# Patient Record
Sex: Male | Born: 2000 | Race: Black or African American | Hispanic: No | Marital: Single | Smoking: Former smoker
Health system: Southern US, Community
[De-identification: ages and names within clinical notes are randomized; demographics above are authoritative.]

## PROBLEM LIST (undated history)

## (undated) DIAGNOSIS — R51 Headache: Secondary | ICD-10-CM

## (undated) DIAGNOSIS — J45909 Unspecified asthma, uncomplicated: Secondary | ICD-10-CM

## (undated) HISTORY — DX: Headache: R51

## (undated) HISTORY — PX: CIRCUMCISION: SHX1350

---
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
## 2019-11-04 ENCOUNTER — Ambulatory Visit (HOSPITAL_COMMUNITY)
Admission: RE | Admit: 2019-11-04 | Discharge: 2019-11-04 | Disposition: A | Discharge status: Home / Self Care | Payer: Medicaid Other | Source: Ambulatory Visit | Attending: Family Medicine | Admitting: Family Medicine

## 2019-11-04 ENCOUNTER — Other Ambulatory Visit: Payer: Self-pay

## 2019-11-04 VITALS — BP 128/74 | HR 98 | Temp 99.8°F | Resp 18

## 2019-11-04 DIAGNOSIS — R1084 Generalized abdominal pain: Secondary | ICD-10-CM | POA: Insufficient documentation

## 2019-11-04 DIAGNOSIS — Z79899 Other long term (current) drug therapy: Secondary | ICD-10-CM | POA: Insufficient documentation

## 2019-11-04 DIAGNOSIS — J45909 Unspecified asthma, uncomplicated: Secondary | ICD-10-CM | POA: Insufficient documentation

## 2019-11-04 DIAGNOSIS — R000 Tachycardia, unspecified: Secondary | ICD-10-CM | POA: Insufficient documentation

## 2019-11-04 DIAGNOSIS — B349 Viral infection, unspecified: Secondary | ICD-10-CM | POA: Insufficient documentation

## 2019-11-04 DIAGNOSIS — Z1152 Encounter for screening for COVID-19: Secondary | ICD-10-CM | POA: Insufficient documentation

## 2019-11-04 DIAGNOSIS — R5383 Other fatigue: Secondary | ICD-10-CM | POA: Insufficient documentation

## 2019-11-04 DIAGNOSIS — Z20822 Contact with and (suspected) exposure to covid-19: Secondary | ICD-10-CM | POA: Insufficient documentation

## 2019-11-04 DIAGNOSIS — R509 Fever, unspecified: Secondary | ICD-10-CM | POA: Insufficient documentation

## 2019-11-04 DIAGNOSIS — R519 Headache, unspecified: Secondary | ICD-10-CM | POA: Insufficient documentation

## 2019-11-04 LAB — CBC WITH DIFFERENTIAL/PLATELET
Abs Immature Granulocytes: 0 10*3/uL (ref 0.00–0.07)
Basophils Absolute: 0 10*3/uL (ref 0.0–0.1)
Basophils Relative: 0 %
Eosinophils Absolute: 0.1 10*3/uL (ref 0.0–0.5)
Eosinophils Relative: 1 %
HCT: 33.8 % — ABNORMAL LOW (ref 39.0–52.0)
Hemoglobin: 10.4 g/dL — ABNORMAL LOW (ref 13.0–17.0)
Lymphocytes Relative: 4 %
Lymphs Abs: 0.3 10*3/uL — ABNORMAL LOW (ref 0.7–4.0)
MCH: 19.6 pg — ABNORMAL LOW (ref 26.0–34.0)
MCHC: 30.8 g/dL (ref 30.0–36.0)
MCV: 63.8 fL — ABNORMAL LOW (ref 80.0–100.0)
Monocytes Absolute: 0.4 10*3/uL (ref 0.1–1.0)
Monocytes Relative: 7 %
Neutro Abs: 5.6 10*3/uL (ref 1.7–7.7)
Neutrophils Relative %: 88 %
Platelets: 468 10*3/uL — ABNORMAL HIGH (ref 150–400)
RBC: 5.3 MIL/uL (ref 4.22–5.81)
RDW: 17.2 % — ABNORMAL HIGH (ref 11.5–15.5)
WBC: 6.4 10*3/uL (ref 4.0–10.5)
nRBC: 0 % (ref 0.0–0.2)
nRBC: 0 /100 WBC

## 2019-11-04 LAB — COMPREHENSIVE METABOLIC PANEL
ALT: 14 U/L (ref 0–44)
AST: 22 U/L (ref 15–41)
Albumin: 2.3 g/dL — ABNORMAL LOW (ref 3.5–5.0)
Alkaline Phosphatase: 71 U/L (ref 38–126)
Anion gap: 11 (ref 5–15)
BUN: 8 mg/dL (ref 6–20)
CO2: 25 mmol/L (ref 22–32)
Calcium: 8.2 mg/dL — ABNORMAL LOW (ref 8.9–10.3)
Chloride: 94 mmol/L — ABNORMAL LOW (ref 98–111)
Creatinine, Ser: 1.42 mg/dL — ABNORMAL HIGH (ref 0.61–1.24)
GFR calc Af Amer: >60 mL/min (ref >60)
GFR calc non Af Amer: >60 mL/min (ref >60)
Glucose, Bld: 116 mg/dL — ABNORMAL HIGH (ref 70–99)
Potassium: 3.4 mmol/L — ABNORMAL LOW (ref 3.5–5.1)
Sodium: 130 mmol/L — ABNORMAL LOW (ref 135–145)
Total Bilirubin: 0.5 mg/dL (ref 0.3–1.2)
Total Protein: 6.9 g/dL (ref 6.5–8.1)

## 2019-11-04 MED ORDER — SODIUM CHLORIDE 0.9 % IV BOLUS
1000.0000 mL | Freq: Once | INTRAVENOUS | Status: AC
  Administered 2019-11-04: 1000 mL via INTRAVENOUS

## 2019-11-04 MED ORDER — ONDANSETRON HCL 4 MG PO TABS
4.0000 mg | ORAL_TABLET | Freq: Four times a day (QID) | ORAL | 0 refills | Status: DC
Start: 2019-11-04 — End: 2020-05-29

## 2019-11-04 NOTE — ED Triage Notes (Signed)
Pt c/o RLQ abd pain with chills on Monday. States abdominal pain resolved on Tuesday, but is now having hot flashes, headaches, fatigue and diarrhea. Pt had ruptured appendix 2 years ago.

## 2019-11-04 NOTE — Discharge Instructions (Addendum)
Your blood work looks ok, but I would like to check your kidney function again tomorrow  Your EKG was normal  Your COVID test is pending.  You should self quarantine until the test result is back.    Take Tylenol as needed for fever or discomfort.  Rest and keep yourself hydrated.    Go to the emergency department if you develop acute worsening symptoms.

## 2019-11-04 NOTE — ED Provider Notes (Signed)
[facility]   [id number] 11/04/19 Arrival Time: 1556   CC: COVID symptoms  SUBJECTIVE: History from: patient.  Glenn [name] is a 19 y.o. male who presents with abrupt onset of fatigue, abdominal pain, nasal congestion, PND, fever, headache cough for 3 days. Reports decreased appetite and decreased urine output. Denies sick exposure to COVID, flu or strep. Denies recent travel. Has negative history of Covid. Has not completed Covid vaccines. Has not taken OTC medications for this. There are no aggravating or alleviating factors. Denies previous symptoms in the past. Denies sinus pain, rhinorrhea, sore throat, SOB, wheezing, chest pain, nausea, changes in bowel or bladder habits.    ROS: As per HPI.  All other pertinent ROS negative.     Past Medical History:  Diagnosis Date  . Asthma   . [id number](349.1)    Past Surgical History:  Procedure Laterality Date  . CIRCUMCISION     Allergies  Allergen Reactions  . Penicillins Hives  . Other     Seasonol, Pet Dander, Trees, Mold, Eggs, Corn, Red Dye   No current facility-administered medications on file prior to encounter.   Current Outpatient Medications on File Prior to Encounter  Medication Sig Dispense Refill  . acetaminophen (TYLENOL) 325 MG tablet Take 2 tablets (650 mg total) by mouth every 6 (six) hours as needed.    Marland Kitchen albuterol (PROVENTIL HFA;VENTOLIN HFA) 108 (90 Base) MCG/ACT inhaler Inhale 1-2 puffs into the lungs every 6 (six) hours as needed for wheezing or shortness of breath.    . SUMAtriptan (IMITREX) 25 MG tablet Take 1 tablet (25 mg total) by mouth every 2 (two) hours as needed for migraine. Max 2 tabs a day or 3 tabs a week. 10 tablet 3   Social History   Socioeconomic History  . Marital status: Single    Spouse name: Not on file  . Number of children: Not on file  . Years of education: Not on file  . Highest education level: Not on file  Occupational History  . Not on file  Tobacco Use  .  Smoking status: Current Every Day Smoker    Types: E-cigarettes  . Smokeless tobacco: Never Used  Vaping Use  . Vaping Use: Some days  Substance and Sexual Activity  . Alcohol use: No  . Drug use: Never  . Sexual activity: Not Currently  Other Topics Concern  . Not on file  Social History Narrative  . Not on file   Social Determinants of Health   Financial Resource Strain:   . Difficulty of Paying Living Expenses:   Food Insecurity:   . Worried About Programme researcher, broadcasting/film/video in the Last Year:   . Barista in the Last Year:   Transportation Needs:   . Freight forwarder (Medical):   Marland Kitchen Lack of Transportation (Non-Medical):   Physical Activity:   . Days of Exercise per Week:   . Minutes of Exercise per Session:   Stress:   . Feeling of Stress :   Social Connections:   . Frequency of Communication with Friends and Family:   . Frequency of Social Gatherings with Friends and Family:   . Attends Religious Services:   . Active Member of Clubs or Organizations:   . Attends Banker Meetings:   Marland Kitchen Marital Status:   Intimate Partner Violence:   . Fear of Current or Ex-Partner:   . Emotionally Abused:   Marland Kitchen Physically Abused:   . Sexually Abused:  Family History  Problem Relation Age of Onset  . Seizures Cousin        Epilepsy    OBJECTIVE:  Vitals:   11/04/19 1636 11/04/19 1737  BP: 128/74   Pulse: (!) 132 98  Resp: (!) 22 18  Temp: (!) 100.5 F (38.1 C) 99.8 F (37.7 C)  SpO2: 100% 100%     General appearance: alert; appears fatigued, but nontoxic; speaking in full sentences and tolerating own secretions, febrile HEENT: NCAT; Ears: EACs clear, TMs pearly gray; Eyes: PERRL.  EOM grossly intact. Sinuses: nontender; Nose: nares patent without rhinorrhea, Throat: oropharynx clear, tonsils non erythematous or enlarged, uvula midline  Neck: supple without LAD ABD: soft, nondistended, nontender Lungs: unlabored respirations, symmetrical air entry;  cough: absent; no respiratory distress; CTAB, tachypneic Heart: tachycardic with regular rhythm. Radial pulses 2+ symmetrical bilaterally Skin: warm and dry Psychological: alert and cooperative; normal mood and affect  LABS:  Results for orders placed or performed during the hospital encounter of 11/04/19 (from the past 24 hour(s))  CBC with Differential     Status: Abnormal   Collection Time: 11/04/19  4:46 PM  Result Value Ref Range   WBC 6.4 4.0 - 10.5 K/uL   RBC 5.30 4.22 - 5.81 MIL/uL   Hemoglobin 10.4 (L) 13.0 - 17.0 g/dL   HCT 16.1 (L) 39 - 52 %   MCV 63.8 (L) 80.0 - 100.0 fL   MCH 19.6 (L) 26.0 - 34.0 pg   MCHC 30.8 30.0 - 36.0 g/dL   RDW 09.6 (H) 04.5 - 40.9 %   Platelets 468 (H) 150 - 400 K/uL   nRBC 0.0 0.0 - 0.2 %   Neutrophils Relative % 88 %   Neutro Abs 5.6 1.7 - 7.7 K/uL   Lymphocytes Relative 4 %   Lymphs Abs 0.3 (L) 0.7 - 4.0 K/uL   Monocytes Relative 7 %   Monocytes Absolute 0.4 0 - 1 K/uL   Eosinophils Relative 1 %   Eosinophils Absolute 0.1 0 - 0 K/uL   Basophils Relative 0 %   Basophils Absolute 0.0 0 - 0 K/uL   nRBC 0 0 /100 WBC   Abs Immature Granulocytes 0.00 0.00 - 0.07 K/uL   Polychromasia PRESENT    Target Cells PRESENT   Comprehensive metabolic panel     Status: Abnormal   Collection Time: 11/04/19  4:46 PM  Result Value Ref Range   Sodium 130 (L) 135 - 145 mmol/L   Potassium 3.4 (L) 3.5 - 5.1 mmol/L   Chloride 94 (L) 98 - 111 mmol/L   CO2 25 22 - 32 mmol/L   Glucose, Bld 116 (H) 70 - 99 mg/dL   BUN 8 6 - 20 mg/dL   Creatinine, Ser 8.11 (H) 0.61 - 1.24 mg/dL   Calcium 8.2 (L) 8.9 - 10.3 mg/dL   Total Protein 6.9 6.5 - 8.1 g/dL   Albumin 2.3 (L) 3.5 - 5.0 g/dL   AST 22 15 - 41 U/L   ALT 14 0 - 44 U/L   Alkaline Phosphatase 71 38 - 126 U/L   Total Bilirubin 0.5 0.3 - 1.2 mg/dL   GFR calc non Af Amer >60 >60 mL/min   GFR calc Af Amer >60 >60 mL/min   Anion gap 11 5 - 15     ASSESSMENT & PLAN:  1. Viral illness   2. Tachycardia     3. Other fatigue   4. Fever, unspecified fever cause   5. Generalized abdominal pain  6. Nonintractable headache, unspecified chronicity pattern, unspecified headache type   7. Encounter for screening for COVID-19     Meds ordered this encounter  Medications  . sodium chloride 0.9 % bolus 1,000 mL  . ondansetron (ZOFRAN) 4 MG tablet    Sig: Take 1 tablet (4 mg total) by mouth every 6 (six) hours.    Dispense:  12 tablet    Refill:  0    Order Specific Question:   Supervising Provider    Answer:   Merrilee Jansky [id number]    Fluid bolus in office, HR originally 130s, down to 90s after fluids Prescribed zofran COVID testing ordered.  It will take between 1-2 days for test results.  Someone will contact you regarding abnormal results.    Patient should remain in quarantine until they have received Covid results.  If negative you may resume normal activities (go back to work/school) while practicing hand hygiene, social distance, and mask wearing.  If positive, patient should remain in quarantine for 10 days from symptom onset AND greater than 72 hours after symptoms resolution (absence of fever without the use of fever-reducing medication and improvement in respiratory symptoms), whichever is longer Get plenty of rest and push fluids Use OTC zyrtec for nasal congestion, runny nose, and/or sore throat Use OTC flonase for nasal congestion and runny nose Use medications daily for symptom relief Use OTC medications like ibuprofen or tylenol as needed fever or pain Suspect dehydration secondary to tachycardia, fever, illness Follow up tomorrow for recheck CMP Call or go to the ED if you have any new or worsening symptoms such as fever, worsening cough, shortness of breath, chest tightness, chest pain, turning blue, changes in mental status.  Reviewed expectations re: course of current medical issues. Questions answered. Outlined signs and symptoms indicating need for more acute  intervention. Patient verbalized understanding. After Visit Summary given.         Moshe Cipro, NP 11/04/19 2118

## 2019-11-05 ENCOUNTER — Encounter (HOSPITAL_COMMUNITY): Payer: Self-pay

## 2019-11-05 ENCOUNTER — Ambulatory Visit (HOSPITAL_COMMUNITY)
Admission: EM | Admit: 2019-11-05 | Discharge: 2019-11-05 | Disposition: A | Discharge status: Home / Self Care | Payer: Medicaid Other | Attending: Emergency Medicine | Admitting: Emergency Medicine

## 2019-11-05 DIAGNOSIS — Z09 Encounter for follow-up examination after completed treatment for conditions other than malignant neoplasm: Secondary | ICD-10-CM

## 2019-11-05 DIAGNOSIS — E860 Dehydration: Secondary | ICD-10-CM | POA: Insufficient documentation

## 2019-11-05 LAB — BASIC METABOLIC PANEL
Anion gap: 8 (ref 5–15)
BUN: 7 mg/dL (ref 6–20)
CO2: 28 mmol/L (ref 22–32)
Calcium: 8.2 mg/dL — ABNORMAL LOW (ref 8.9–10.3)
Chloride: 99 mmol/L (ref 98–111)
Creatinine, Ser: 1.08 mg/dL (ref 0.61–1.24)
GFR calc Af Amer: >60 mL/min (ref >60)
GFR calc non Af Amer: >60 mL/min (ref >60)
Glucose, Bld: 90 mg/dL (ref 70–99)
Potassium: 4.1 mmol/L (ref 3.5–5.1)
Sodium: 135 mmol/L (ref 135–145)

## 2019-11-05 LAB — SARS CORONAVIRUS 2 (TAT 6-24 HRS): SARS Coronavirus 2: NEGATIVE

## 2019-11-05 NOTE — Discharge Instructions (Signed)
We are rechecking electrolytes and kidney function- I will call to update Continue to drink plenty of fluids Follow up for any concerns

## 2019-11-05 NOTE — ED Triage Notes (Signed)
Pt presents for follow up from yesterdays UC visit: pt has negative covid results and is feeling much better.

## 2019-11-07 NOTE — ED Provider Notes (Signed)
[facility]    CSN: [id number] Arrival date & time: 11/05/19  1047      History   Chief Complaint Chief Complaint  Patient presents with  . Follow-up    HPI Glenn [name] is a 19 y.o. male presented today for follow-up from visit yesterday.  Patient was seen here yesterday for abdominal pain nausea and vomiting, was given fluids and had blood work done.  He had elevated creatinine at 1.4 and recommended to have follow-up for repeat blood work to ensure resolving dehydration.  Patient reports that his nausea and vomiting has subsided and has been able to tolerate oral intake of solids and liquids since visit yesterday.  He is energy level is improving.  HPI  Past Medical History:  Diagnosis Date  . Asthma   . KYHCWCBJ(628.3)     Patient Active Problem List   Diagnosis Date Noted  . Abdominal pain 10/15/2017  . Perforated appendicitis 10/01/2017  . Migraine without aura and with status migrainosus, not intractable 10/19/2012  . ADHD (attention deficit hyperactivity disorder) 10/19/2012  . 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 CIRCUMCISION         Home Medications    Prior to Admission medications   Medication Sig Start Date End Date Taking? Authorizing Provider  acetaminophen (TYLENOL) 325 MG tablet Take 2 tablets (650 mg total) by mouth every 6 (six) hours as needed. 10/16/17   Sherrie George, PA-C  albuterol (PROVENTIL HFA;VENTOLIN HFA) 108 (90 Base) MCG/ACT inhaler Inhale 1-2 puffs into the lungs every 6 (six) hours as needed for wheezing or shortness of breath.    [provider]  ondansetron (ZOFRAN) 4 MG tablet Take 1 tablet (4 mg total) by mouth every 6 (six) hours. 11/04/19   Moshe Cipro, NP  SUMAtriptan (IMITREX) 25 MG tablet Take 1 tablet (25 mg total) by mouth every 2 (two) hours as needed for migraine. Max 2 tabs a day or 3 tabs a week.  01/20/13   Keturah Shavers, MD    Family History Family History  Problem Relation Age of Onset  . Seizures Cousin        Epilepsy    Social History Social History   Tobacco Use  . Smoking status: Current Every Day Smoker    Types: E-cigarettes  . Smokeless tobacco: Never Used  Vaping Use  . Vaping Use: Some days  Substance Use Topics  . Alcohol use: No  . Drug use: Never     Allergies   Penicillins and Other   Review of Systems Review of Systems  Constitutional: Negative for activity change, appetite change, chills, fatigue and fever.  HENT: Positive for congestion. Negative for ear pain, rhinorrhea, sinus pressure and trouble swallowing.   Eyes: Negative for discharge and redness.  Respiratory: Negative for cough, chest tightness and shortness of breath.   Cardiovascular: Negative for chest pain.  Gastrointestinal: Negative for abdominal pain, diarrhea, nausea and vomiting.  Musculoskeletal: Negative for myalgias.  Skin: Negative for rash.  Neurological: Negative for dizziness, light-headedness and headaches.     Physical Exam Triage Vital Signs ED Triage Vitals  Enc Vitals Group     BP 11/05/19 1159 122/72     Pulse Rate 11/05/19 1159 97     Resp 11/05/19 1159 18     Temp 11/05/19 1159 98.7 F (37.1 C)     Temp Source 11/05/19 1159 Oral  SpO2 11/05/19 1159 100 %     Weight --      Height --      Head Circumference --      Peak Flow --      Pain Score 11/05/19 1158 0     Pain Loc --      Pain Edu? --      Excl. in GC? --    No data found.  Updated Vital Signs BP 122/72 (BP Location: Right Arm)   Pulse 97   Temp 98.7 F (37.1 C) (Oral)   Resp 18   SpO2 100%   Visual Acuity Right Eye Distance:   Left Eye Distance:   Bilateral Distance:    Right Eye Near:   Left Eye Near:    Bilateral Near:     Physical Exam Vitals and nursing note reviewed.  Constitutional:      Appearance: He is well-developed.     Comments: No acute distress    HENT:     Head: Normocephalic and atraumatic.     Nose: Nose normal.  Eyes:     Conjunctiva/sclera: Conjunctivae normal.  Cardiovascular:     Rate and Rhythm: Normal rate.  Pulmonary:     Effort: Pulmonary effort is normal. No respiratory distress.     Comments: Breathing comfortably at rest, CTABL, no wheezing, rales or other adventitious sounds auscultated\ Abdominal:     General: There is no distension.  Musculoskeletal:        General: Normal range of motion.     Cervical back: Neck supple.  Skin:    General: Skin is warm and dry.  Neurological:     Mental Status: He is alert and oriented to person, place, and time.      UC Treatments / Results  Labs (all labs ordered are listed, but only abnormal results are displayed) Labs Reviewed  BASIC METABOLIC PANEL - Abnormal; Notable for the following components:      Result Value   Calcium 8.2 (*)    All other components within normal limits    EKG   Radiology No results found.  Procedures Procedures (including critical care time)  Medications Ordered in UC Medications - No data to display  Initial Impression / Assessment and Plan / UC Course  I have reviewed the triage vital signs and the nursing notes.  Pertinent labs & imaging results that were available during my care of the patient were reviewed by me and considered in my medical decision making (see chart for details).     Patient clinically and symptomatically improving from visit yesterday.  Will recheck BMP to ensure resolving creatinine and electrolyte abnormalities.  Will call with results and alter plan as needed.  Continue to push fluids.  Discussed strict return precautions. Patient verbalized understanding and is agreeable with plan.  Final Clinical Impressions(s) / UC Diagnoses   Final diagnoses:  Dehydration  Follow up     Discharge Instructions     We are rechecking electrolytes and kidney function- I will call to update Continue to  drink plenty of fluids Follow up for any concerns   ED Prescriptions    None     PDMP not reviewed this encounter.   Lew Dawes, [place] 11/07/19 [phone]

## 2019-11-20 ENCOUNTER — Other Ambulatory Visit: Payer: Self-pay

## 2019-11-20 ENCOUNTER — Ambulatory Visit (HOSPITAL_COMMUNITY)
Admission: EM | Admit: 2019-11-20 | Discharge: 2019-11-20 | Disposition: A | Discharge status: Home / Self Care | Payer: HRSA Program | Attending: Urgent Care | Admitting: Urgent Care

## 2019-11-20 ENCOUNTER — Encounter (HOSPITAL_COMMUNITY): Payer: Self-pay | Admitting: *Deleted

## 2019-11-20 DIAGNOSIS — U071 COVID-19: Secondary | ICD-10-CM | POA: Insufficient documentation

## 2019-11-20 DIAGNOSIS — R197 Diarrhea, unspecified: Secondary | ICD-10-CM | POA: Diagnosis present

## 2019-11-20 DIAGNOSIS — R05 Cough: Secondary | ICD-10-CM | POA: Insufficient documentation

## 2019-11-20 DIAGNOSIS — B349 Viral infection, unspecified: Secondary | ICD-10-CM

## 2019-11-20 DIAGNOSIS — R059 Cough, unspecified: Secondary | ICD-10-CM

## 2019-11-20 DIAGNOSIS — R5383 Other fatigue: Secondary | ICD-10-CM | POA: Diagnosis not present

## 2019-11-20 DIAGNOSIS — R432 Parageusia: Secondary | ICD-10-CM

## 2019-11-20 DIAGNOSIS — J45909 Unspecified asthma, uncomplicated: Secondary | ICD-10-CM | POA: Diagnosis not present

## 2019-11-20 DIAGNOSIS — R430 Anosmia: Secondary | ICD-10-CM

## 2019-11-20 DIAGNOSIS — F17290 Nicotine dependence, other tobacco product, uncomplicated: Secondary | ICD-10-CM | POA: Diagnosis not present

## 2019-11-20 DIAGNOSIS — Z79899 Other long term (current) drug therapy: Secondary | ICD-10-CM | POA: Insufficient documentation

## 2019-11-20 LAB — SARS CORONAVIRUS 2 (TAT 6-24 HRS): SARS Coronavirus 2: POSITIVE — AB

## 2019-11-20 MED ORDER — PROMETHAZINE-DM 6.25-15 MG/5ML PO SYRP
5.0000 mL | ORAL_SOLUTION | Freq: Every evening | ORAL | 0 refills | Status: DC | PRN
Start: 2019-11-20 — End: 2020-05-29

## 2019-11-20 MED ORDER — BENZONATATE 100 MG PO CAPS: 100.0000 mg | ORAL_CAPSULE | Freq: Three times a day (TID) | ORAL | 0 refills | Status: DC | PRN

## 2019-11-20 NOTE — ED Provider Notes (Signed)
[facility]   MRN: [id number] DOB: 2001-01-08  Subjective:   Cecile Sheerer III is a 19 y.o. male presenting for 2 to 3-day history of acute onset loss of sense of taste and smell, diarrhea, fatigue, cough.  Patient had his Covid vaccination completed.  He works at [employer], has multiple exposures.  Denies fever, chest pain, shortness of breath, nausea, vomiting, belly pain, body aches.  Patient is not a smoker.  He does not have active asthma, states that he does not need an inhaler.  No current facility-administered medications for this encounter.  Current Outpatient Medications:  .  acetaminophen (TYLENOL) 325 MG tablet, Take 2 tablets (650 mg total) by mouth every 6 (six) hours as needed., Disp: , Rfl:  .  albuterol (PROVENTIL HFA;VENTOLIN HFA) 108 (90 Base) MCG/ACT inhaler, Inhale 1-2 puffs into the lungs every 6 (six) hours as needed for wheezing or shortness of breath., Disp: , Rfl:  .  ondansetron (ZOFRAN) 4 MG tablet, Take 1 tablet (4 mg total) by mouth every 6 (six) hours., Disp: 12 tablet, Rfl: 0 .  SUMAtriptan (IMITREX) 25 MG tablet, Take 1 tablet (25 mg total) by mouth every 2 (two) hours as needed for migraine. Max 2 tabs a day or 3 tabs a week., Disp: 10 tablet, Rfl: 3   Allergies  Allergen Reactions  . Penicillins Hives  . Other     Seasonol, Pet Dander, Trees, Mold, Eggs, Corn, [employer] Dye    Past Medical History:  Diagnosis Date  . Asthma   . MWNUUVOZ(366.4)      Past Surgical History:  Procedure Laterality Date  . CIRCUMCISION      Family History  Problem Relation Age of Onset  . Seizures Cousin        Epilepsy    Social History   Tobacco Use  . Smoking status: Current Some Day Smoker    Types: E-cigarettes  . Smokeless tobacco: Never Used  Vaping Use  . Vaping Use: Some days  Substance Use Topics  . Alcohol use: No  . Drug use: Never    ROS   Objective:   Vitals: BP 120/78   Pulse 85   Temp 99.4 F (37.4 C) (Oral)    Resp 16   SpO2 100%   Physical Exam Constitutional:      General: He is not in acute distress.    Appearance: Normal appearance. He is well-developed. He is not ill-appearing, toxic-appearing or diaphoretic.  HENT:     Head: Normocephalic and atraumatic.     Right Ear: External ear normal.     Left Ear: External ear normal.     Nose: Nose normal.     Mouth/Throat:     Mouth: Mucous membranes are moist.     Pharynx: Oropharynx is clear.  Eyes:     General: No scleral icterus.    Extraocular Movements: Extraocular movements intact.     Pupils: Pupils are equal, round, and reactive to light.  Cardiovascular:     Rate and Rhythm: Normal rate and regular rhythm.     Heart sounds: Normal heart sounds. No murmur heard.  No friction rub. No gallop.   Pulmonary:     Effort: Pulmonary effort is normal. No respiratory distress.     Breath sounds: Normal breath sounds. No stridor. No wheezing, rhonchi or rales.  Neurological:     Mental Status: He is alert and oriented to person, place, and time.  Psychiatric:  Mood and Affect: Mood normal.        Behavior: Behavior normal.        Thought Content: Thought content normal.      Assessment and Plan :   PDMP not reviewed this encounter.  1. Loss of taste   2. Loss of smell   3. Diarrhea, unspecified type   4. Cough   5. Other fatigue   6. Viral syndrome     Will manage for viral illness such as viral URI, viral syndrome, viral rhinitis, COVID-19. Counseled patient on nature of COVID-19 including modes of transmission, diagnostic testing, management and supportive care.  Offered scripts for symptomatic relief. COVID 19 testing is pending. Counseled patient on potential for adverse effects with medications prescribed/recommended today, ER and return-to-clinic precautions discussed, patient verbalized understanding.     Wallis Bamberg, PA-C 11/20/19 1200

## 2019-11-20 NOTE — ED Triage Notes (Signed)
Reports loss of taste & smell, diarrhea, fatigue, cough x 2-3 days without fever.

## 2019-11-20 NOTE — Discharge Instructions (Signed)

## 2020-05-23 ENCOUNTER — Ambulatory Visit (HOSPITAL_COMMUNITY)
Admission: EM | Admit: 2020-05-23 | Discharge: 2020-05-23 | Disposition: A | Discharge status: Home / Self Care | Payer: BC Managed Care – PPO | Attending: Medical Oncology | Admitting: Medical Oncology

## 2020-05-23 ENCOUNTER — Ambulatory Visit (INDEPENDENT_AMBULATORY_CARE_PROVIDER_SITE_OTHER): Payer: BC Managed Care – PPO

## 2020-05-23 ENCOUNTER — Encounter (HOSPITAL_COMMUNITY): Payer: Self-pay | Admitting: *Deleted

## 2020-05-23 ENCOUNTER — Inpatient Hospital Stay (HOSPITAL_COMMUNITY)
Admission: EM | Admit: 2020-05-23 | Discharge: 2020-05-29 | DRG: 175 | Disposition: A | Discharge status: Home / Self Care | Payer: BC Managed Care – PPO | Attending: Internal Medicine | Admitting: Internal Medicine

## 2020-05-23 ENCOUNTER — Encounter (HOSPITAL_COMMUNITY): Payer: Self-pay | Admitting: Emergency Medicine

## 2020-05-23 ENCOUNTER — Emergency Department (HOSPITAL_COMMUNITY): Payer: BC Managed Care – PPO

## 2020-05-23 ENCOUNTER — Other Ambulatory Visit: Payer: Self-pay

## 2020-05-23 DIAGNOSIS — J45909 Unspecified asthma, uncomplicated: Secondary | ICD-10-CM | POA: Diagnosis present

## 2020-05-23 DIAGNOSIS — Z20822 Contact with and (suspected) exposure to covid-19: Secondary | ICD-10-CM | POA: Diagnosis present

## 2020-05-23 DIAGNOSIS — E871 Hypo-osmolality and hyponatremia: Secondary | ICD-10-CM | POA: Diagnosis present

## 2020-05-23 DIAGNOSIS — Z79899 Other long term (current) drug therapy: Secondary | ICD-10-CM

## 2020-05-23 DIAGNOSIS — G43909 Migraine, unspecified, not intractable, without status migrainosus: Secondary | ICD-10-CM | POA: Diagnosis present

## 2020-05-23 DIAGNOSIS — Z91048 Other nonmedicinal substance allergy status: Secondary | ICD-10-CM

## 2020-05-23 DIAGNOSIS — F909 Attention-deficit hyperactivity disorder, unspecified type: Secondary | ICD-10-CM | POA: Diagnosis present

## 2020-05-23 DIAGNOSIS — D509 Iron deficiency anemia, unspecified: Secondary | ICD-10-CM | POA: Diagnosis present

## 2020-05-23 DIAGNOSIS — I2609 Other pulmonary embolism with acute cor pulmonale: Secondary | ICD-10-CM | POA: Diagnosis present

## 2020-05-23 DIAGNOSIS — I2699 Other pulmonary embolism without acute cor pulmonale: Secondary | ICD-10-CM

## 2020-05-23 DIAGNOSIS — Z8616 Personal history of COVID-19: Secondary | ICD-10-CM | POA: Diagnosis not present

## 2020-05-23 DIAGNOSIS — F17290 Nicotine dependence, other tobacco product, uncomplicated: Secondary | ICD-10-CM | POA: Diagnosis present

## 2020-05-23 DIAGNOSIS — I5181 Takotsubo syndrome: Secondary | ICD-10-CM | POA: Diagnosis present

## 2020-05-23 DIAGNOSIS — K50919 Crohn's disease, unspecified, with unspecified complications: Secondary | ICD-10-CM | POA: Diagnosis not present

## 2020-05-23 DIAGNOSIS — R1031 Right lower quadrant pain: Secondary | ICD-10-CM | POA: Diagnosis not present

## 2020-05-23 DIAGNOSIS — I824Y2 Acute embolism and thrombosis of unspecified deep veins of left proximal lower extremity: Secondary | ICD-10-CM | POA: Diagnosis not present

## 2020-05-23 DIAGNOSIS — Z888 Allergy status to other drugs, medicaments and biological substances status: Secondary | ICD-10-CM

## 2020-05-23 DIAGNOSIS — Z880 Allergy status to penicillin: Secondary | ICD-10-CM

## 2020-05-23 DIAGNOSIS — R0602 Shortness of breath: Secondary | ICD-10-CM

## 2020-05-23 DIAGNOSIS — K5090 Crohn's disease, unspecified, without complications: Secondary | ICD-10-CM

## 2020-05-23 DIAGNOSIS — Z23 Encounter for immunization: Secondary | ICD-10-CM

## 2020-05-23 DIAGNOSIS — Z91018 Allergy to other foods: Secondary | ICD-10-CM | POA: Diagnosis not present

## 2020-05-23 DIAGNOSIS — I3139 Other pericardial effusion (noninflammatory): Secondary | ICD-10-CM

## 2020-05-23 DIAGNOSIS — I5021 Acute systolic (congestive) heart failure: Secondary | ICD-10-CM | POA: Diagnosis present

## 2020-05-23 DIAGNOSIS — K50019 Crohn's disease of small intestine with unspecified complications: Secondary | ICD-10-CM

## 2020-05-23 DIAGNOSIS — I2602 Saddle embolus of pulmonary artery with acute cor pulmonale: Secondary | ICD-10-CM

## 2020-05-23 DIAGNOSIS — I313 Pericardial effusion (noninflammatory): Secondary | ICD-10-CM

## 2020-05-23 DIAGNOSIS — Z9102 Food additives allergy status: Secondary | ICD-10-CM

## 2020-05-23 DIAGNOSIS — R570 Cardiogenic shock: Secondary | ICD-10-CM | POA: Diagnosis not present

## 2020-05-23 DIAGNOSIS — I82412 Acute embolism and thrombosis of left femoral vein: Secondary | ICD-10-CM | POA: Diagnosis present

## 2020-05-23 DIAGNOSIS — I420 Dilated cardiomyopathy: Secondary | ICD-10-CM | POA: Diagnosis not present

## 2020-05-23 DIAGNOSIS — D573 Sickle-cell trait: Secondary | ICD-10-CM | POA: Diagnosis present

## 2020-05-23 DIAGNOSIS — I82432 Acute embolism and thrombosis of left popliteal vein: Secondary | ICD-10-CM | POA: Diagnosis present

## 2020-05-23 DIAGNOSIS — I5082 Biventricular heart failure: Secondary | ICD-10-CM | POA: Diagnosis present

## 2020-05-23 DIAGNOSIS — Z820 Family history of epilepsy and other diseases of the nervous system: Secondary | ICD-10-CM

## 2020-05-23 DIAGNOSIS — I82402 Acute embolism and thrombosis of unspecified deep veins of left lower extremity: Secondary | ICD-10-CM

## 2020-05-23 HISTORY — DX: Crohn's disease, unspecified, without complications: K50.90

## 2020-05-23 HISTORY — DX: Pericardial effusion (noninflammatory): I31.3

## 2020-05-23 HISTORY — DX: Other pulmonary embolism without acute cor pulmonale: I26.99

## 2020-05-23 HISTORY — DX: Other pericardial effusion (noninflammatory): I31.39

## 2020-05-23 HISTORY — DX: Acute embolism and thrombosis of unspecified deep veins of left lower extremity: I82.402

## 2020-05-23 LAB — RESP PANEL BY RT-PCR (FLU A&B, COVID) ARPGX2
Influenza A by PCR: NEGATIVE
Influenza B by PCR: NEGATIVE
SARS Coronavirus 2 by RT PCR: NEGATIVE

## 2020-05-23 LAB — COMPREHENSIVE METABOLIC PANEL
ALT: 11 U/L (ref 0–44)
AST: 11 U/L — ABNORMAL LOW (ref 15–41)
Albumin: 1.6 g/dL — ABNORMAL LOW (ref 3.5–5.0)
Alkaline Phosphatase: 101 U/L (ref 38–126)
Anion gap: 10 (ref 5–15)
BUN: 10 mg/dL (ref 6–20)
CO2: 23 mmol/L (ref 22–32)
Calcium: 7.8 mg/dL — ABNORMAL LOW (ref 8.9–10.3)
Chloride: 100 mmol/L (ref 98–111)
Creatinine, Ser: 1.2 mg/dL (ref 0.61–1.24)
GFR, Estimated: >60 mL/min (ref >60)
Glucose, Bld: 110 mg/dL — ABNORMAL HIGH (ref 70–99)
Potassium: 4.2 mmol/L (ref 3.5–5.1)
Sodium: 133 mmol/L — ABNORMAL LOW (ref 135–145)
Total Bilirubin: 0.4 mg/dL (ref 0.3–1.2)
Total Protein: 6.1 g/dL — ABNORMAL LOW (ref 6.5–8.1)

## 2020-05-23 LAB — URINALYSIS, ROUTINE W REFLEX MICROSCOPIC
Bilirubin Urine: NEGATIVE
Glucose, UA: NEGATIVE mg/dL
Hgb urine dipstick: NEGATIVE
Ketones, ur: NEGATIVE mg/dL
Leukocytes,Ua: NEGATIVE
Nitrite: NEGATIVE
Protein, ur: NEGATIVE mg/dL
Specific Gravity, Urine: 1.019 (ref 1.005–1.030)
pH: 5 (ref 5.0–8.0)

## 2020-05-23 LAB — ECHOCARDIOGRAM LIMITED
Calc EF: 29.3 %
Height: 75 in
Single Plane A2C EF: 29.4 %
Single Plane A4C EF: 32.7 %
Weight: 3680 oz

## 2020-05-23 LAB — CBC
HCT: 33.4 % — ABNORMAL LOW (ref 39.0–52.0)
Hemoglobin: 10.7 g/dL — ABNORMAL LOW (ref 13.0–17.0)
MCH: 21.9 pg — ABNORMAL LOW (ref 26.0–34.0)
MCHC: 32 g/dL (ref 30.0–36.0)
MCV: 68.4 fL — ABNORMAL LOW (ref 80.0–100.0)
Platelets: 405 10*3/uL — ABNORMAL HIGH (ref 150–400)
RBC: 4.88 MIL/uL (ref 4.22–5.81)
RDW: 19.2 % — ABNORMAL HIGH (ref 11.5–15.5)
WBC: 7.5 10*3/uL (ref 4.0–10.5)
nRBC: 0 % (ref 0.0–0.2)

## 2020-05-23 LAB — TROPONIN I (HIGH SENSITIVITY)
Troponin I (High Sensitivity): 11 ng/L (ref <18)
Troponin I (High Sensitivity): 12 ng/L (ref <18)

## 2020-05-23 LAB — HEPARIN LEVEL (UNFRACTIONATED): Heparin Unfractionated: 0.15 IU/mL — ABNORMAL LOW (ref 0.30–0.70)

## 2020-05-23 LAB — BRAIN NATRIURETIC PEPTIDE: B Natriuretic Peptide: 355.4 pg/mL — ABNORMAL HIGH (ref 0.0–100.0)

## 2020-05-23 LAB — TSH: TSH: 2.7 u[IU]/mL (ref 0.350–4.500)

## 2020-05-23 LAB — LIPASE, BLOOD: Lipase: 18 U/L (ref 11–51)

## 2020-05-23 MED ORDER — SODIUM CHLORIDE 0.9 % IV BOLUS
1000.0000 mL | Freq: Once | INTRAVENOUS | Status: AC
  Administered 2020-05-23: 1000 mL via INTRAVENOUS

## 2020-05-23 MED ORDER — IOHEXOL 350 MG/ML SOLN
100.0000 mL | Freq: Once | INTRAVENOUS | Status: AC | PRN
  Administered 2020-05-23: 100 mL via INTRAVENOUS

## 2020-05-23 MED ORDER — HEPARIN (PORCINE) 25000 UT/250ML-% IV SOLN
2750.0000 [IU]/h | INTRAVENOUS | Status: DC
  Administered 2020-05-23: 1700 [IU]/h via INTRAVENOUS
  Administered 2020-05-24: 1900 [IU]/h via INTRAVENOUS
  Administered 2020-05-24: 2100 [IU]/h via INTRAVENOUS
  Administered 2020-05-25 (×2): 2450 [IU]/h via INTRAVENOUS
  Administered 2020-05-25: 2350 [IU]/h via INTRAVENOUS
  Administered 2020-05-26: 2450 [IU]/h via INTRAVENOUS
  Administered 2020-05-26: 2550 [IU]/h via INTRAVENOUS
  Administered 2020-05-27 – 2020-05-28 (×4): 2700 [IU]/h via INTRAVENOUS
  Filled 2020-05-23 (×14): qty 250

## 2020-05-23 MED ORDER — HEPARIN BOLUS VIA INFUSION
6300.0000 [IU] | Freq: Once | INTRAVENOUS | Status: AC
  Administered 2020-05-23: 6300 [IU] via INTRAVENOUS
  Filled 2020-05-23: qty 6300

## 2020-05-23 MED ORDER — SPIRITUS FRUMENTI
1.0000 | Freq: Once | ORAL | Status: AC
  Administered 2020-05-23: 1 via ORAL
  Filled 2020-05-23: qty 1

## 2020-05-23 MED ORDER — CHLORHEXIDINE GLUCONATE CLOTH 2 % EX PADS
6.0000 | MEDICATED_PAD | Freq: Every day | CUTANEOUS | Status: DC
  Administered 2020-05-24 – 2020-05-29 (×6): 6 via TOPICAL

## 2020-05-23 MED ORDER — HEPARIN BOLUS VIA INFUSION
3000.0000 [IU] | Freq: Once | INTRAVENOUS | Status: AC
  Administered 2020-05-23: 3000 [IU] via INTRAVENOUS
  Filled 2020-05-23: qty 3000

## 2020-05-23 MED ORDER — LACTATED RINGERS IV SOLN: INTRAVENOUS | Status: DC

## 2020-05-23 NOTE — ED Provider Notes (Signed)
I provided a substantive portion of the care of this patient.  I personally performed the entirety of the medical decision making for this encounter.  EKG Interpretation  Date/Time:  Wednesday May 23 2020 10:12:36 EST Ventricular Rate:  140 PR Interval:  124 QRS Duration: 74 QT Interval:  292 QTC Calculation: 445 R Axis:   80 Text Interpretation: Sinus tachycardia Otherwise normal ECG Confirmed by Lorre Nick ([id number]) on 05/23/2020 1:76:21 PM  20 year old male here complains of shortness of breath.  Has evidence of PE on the CT scan.  Will heparinize admit to the hospital   Lorre Nick, MD 05/23/20 1455

## 2020-05-23 NOTE — ED Notes (Signed)
Attempted to call report

## 2020-05-23 NOTE — Consult Note (Addendum)
Cardiology Consultation:   Patient ID: Glenn [name] MRN: [id number]; DOB: 10/11/2000  Admit date: 05/23/2020 Date of Consult: 05/23/2020  PCP:  Ihor Gully, MD   [facility]  Cardiologist:  No primary care provider on file.  Advanced Practice Provider:  No care team member to display Electrophysiologist:  None     Patient Profile:   Glenn [name] is a 20 y.o. male with a hx of Crohn's disease, asthma, migraines who is being seen today for the evaluation of pericardial effusion at the request of Dr. Octavia Heir.  History of Present Illness:   Glenn [name] is 20 years old today with no significant past cardiovascular history.  Has a history of Covid 4 to 5 months ago.  Presented today for shortness of breath for the past 4 to 5 days with cough, dyspnea on exertion, lightheadedness.  In the emergency department he was found to have an elevated BNP, and extensive acute bilateral pulmonary emboli with CT evidence of right heart strain.  Moderate pericardial effusion is also seen.  There were also inflammatory changes on abdomen pelvis consistent with inflammatory bowel disease and probable reactive mesenteric lymphadenopathy.  Echocardiogram limited was obtained to further evaluate pericardial effusion.  I have independently reviewed these images.  LVEF 40 to 45%, RV is severely enlarged with moderately reduced systolic function, and RVSP 53 mmHg.  Positive McConnell sign.  Moderate tricuspid valve regurgitation.  Moderate sized circumferential pericardial effusion.  I have also independently reviewed the images from CT chest angio performed earlier today.  Bilateral PE as noted, RV strain with reflux of contrast into the IVC, moderate-sized pericardial effusion anterior to the RV, inferior and apical.  Patient is sitting comfortably in the room not in respiratory distress.  He is hypotensive and tachycardic. Denies chest pain now or previous to  admission.    Past Medical History:  Diagnosis Date  . Asthma   . OACZYSAY(301.6)     Past Surgical History:  Procedure Laterality Date  . CIRCUMCISION       Home Medications:  Prior to Admission medications   Medication Sig Start Date End Date Taking? Authorizing Provider  mesalamine (APRISO) 0.375 g 24 hr capsule Take 0.375 g by mouth every morning. 11/29/19  Yes [provider]  acetaminophen (TYLENOL) 325 MG tablet Take 2 tablets (650 mg total) by mouth every 6 (six) hours as needed. Patient not taking: Reported on 05/23/2020 10/16/17   Sherrie George, PA-C  benzonatate (TESSALON) 100 MG capsule Take 1-2 capsules (100-200 mg total) by mouth 3 (three) times daily as needed. Patient not taking: Reported on 05/23/2020 11/20/19   Wallis Bamberg, PA-C  ondansetron (ZOFRAN) 4 MG tablet Take 1 tablet (4 mg total) by mouth every 6 (six) hours. Patient not taking: Reported on 05/23/2020 11/04/19   Moshe Cipro, NP  promethazine-dextromethorphan (PROMETHAZINE-DM) 6.25-15 MG/5ML syrup Take 5 mLs by mouth at bedtime as needed for cough. Patient not taking: Reported on 05/23/2020 11/20/19   Wallis Bamberg, PA-C  SUMAtriptan (IMITREX) 25 MG tablet Take 1 tablet (25 mg total) by mouth every 2 (two) hours as needed for migraine. Max 2 tabs a day or 3 tabs a week. Patient not taking: Reported on 05/23/2020 01/20/13   Keturah Shavers, MD    Inpatient Medications: Scheduled Meds: . spiritus frumenti  1 each Oral Once   Continuous Infusions: . heparin 1,700 Units/hr (05/23/20 1552)  . lactated ringers     PRN Meds:   Allergies:  Allergies  Allergen Reactions  . Penicillins Hives  . Cefdinir Hives  . Corn-Containing Products   . Other     Seasonal, Pet Dander, Trees, Mold, Eggs, Corn, Red Dye    Social History:   Social History   Socioeconomic History  . Marital status: Single    Spouse name: Not on file  . Number of children: Not on file  . Years of education: Not on  file  . Highest education level: Not on file  Occupational History  . Not on file  Tobacco Use  . Smoking status: Current Some Day Smoker    Types: E-cigarettes  . Smokeless tobacco: Never Used  Vaping Use  . Vaping Use: Some days  Substance and Sexual Activity  . Alcohol use: No  . Drug use: Never  . Sexual activity: Not on file  Other Topics Concern  . Not on file  Social History Narrative  . Not on file   Social Determinants of Health   Financial Resource Strain: Not on file  Food Insecurity: Not on file  Transportation Needs: Not on file  Physical Activity: Not on file  Stress: Not on file  Social Connections: Not on file  Intimate Partner Violence: Not on file    Family History:    Family History  Problem Relation Age of Onset  . Seizures Cousin        Epilepsy     ROS:  Please see the history of present illness.   All other ROS reviewed and negative.     Physical Exam/Data:   Vitals:   05/23/20 1526 05/23/20 1530 05/23/20 1545 05/23/20 1555  BP:  114/68 102/70   Pulse: (!) 126  (!) 128 (!) 126  Resp: (!) 22 (!) 27 19 (!) 21  Temp:      SpO2: 98%  98% 100%  Weight:      Height:       No intake or output data in the 24 hours ending 05/23/20 1633 Last 3 Weights 05/23/2020 10/14/2017 09/30/2017  Weight (lbs) 230 lb 250 lb 250 lb  Weight (kg) 104.327 kg 113.399 kg 113.399 kg     Body mass index is 28.75 kg/m.  General: Thin but no acute distress HEENT: normal Neck: no JVD appreciated  Cardiac: Regular rhythm, no murmurs. + friction rub.  Chest wall demonstrates possible pectus excavatum deformity. Lungs:  clear to auscultation bilaterally Abd: soft, nontender, no hepatomegaly  Ext: no edema Musculoskeletal:  No deformities, BUE and BLE strength normal and equal Skin: warm and dry  Neuro:  CNs 2-12 intact, no focal abnormalities noted Psych:  Normal affect   EKG:  The EKG was personally reviewed and demonstrates: Sinus tachycardia Telemetry:   Telemetry was personally reviewed and demonstrates: Sinus tachycardia  Relevant CV Studies: Echocardiogram reviewed as noted above  Laboratory Data:  High Sensitivity Troponin:   Recent Labs  Lab 05/23/20 1458  TROPONINIHS 11     Chemistry Recent Labs  Lab 05/23/20 1017  NA 133*  K 4.2  CL 100  CO2 23  GLUCOSE 110*  BUN 10  CREATININE 1.20  CALCIUM 7.8*  GFRNONAA >60  ANIONGAP 10    Recent Labs  Lab 05/23/20 1017  PROT 6.1*  ALBUMIN 1.6*  AST 11*  ALT 11  ALKPHOS 101  BILITOT 0.4   Hematology Recent Labs  Lab 05/23/20 1017  WBC 7.5  RBC 4.88  HGB 10.7*  HCT 33.4*  MCV 68.4*  MCH 21.9*  MCHC 32.0  RDW 19.2*  PLT 405*   BNP Recent Labs  Lab 05/23/20 1307  BNP 355.4*    DDimer No results for input(s): DDIMER in the last 168 hours.   Radiology/Studies:  DG Chest 2 View  Result Date: 05/23/2020 CLINICAL DATA:  Shortness of breath EXAM: CHEST - 2 VIEW COMPARISON:  None. FINDINGS: The lungs are clear. The heart size and pulmonary vascularity are normal. No adenopathy. There is lower thoracic dextroscoliosis. IMPRESSION: Lungs clear.  Cardiac silhouette normal. Electronically Signed   By: Bretta Bang [name] M.D.   On: 05/23/2020 09:32   CT Angio Chest PE W and/or Wo Contrast  Result Date: 05/23/2020 CLINICAL DATA:  Exertion ule shortness of breath for the past 3 days. Right lower quadrant abdominal pain. EXAM: CT ANGIOGRAPHY CHEST CT ABDOMEN AND PELVIS WITH CONTRAST TECHNIQUE: Multidetector CT imaging of the chest was performed using the standard protocol during bolus administration of intravenous contrast. Multiplanar CT image reconstructions and MIPs were obtained to evaluate the vascular anatomy. Multidetector CT imaging of the abdomen and pelvis was performed using the standard protocol during bolus administration of intravenous contrast. CONTRAST:  100 mL Omnipaque 350 intravenous contrast. COMPARISON:  Chest x-ray from same day. CT abdomen pelvis  dated October 15, 2017. FINDINGS: CTA CHEST FINDINGS Cardiovascular: Satisfactory opacification of the pulmonary arteries to the segmental level. Extensive acute bilateral pulmonary emboli involving the distal right and left pulmonary arteries as well as the lobar and segmental pulmonary arteries of all lobes in both lungs. Thin, tiny embolism at the main pulmonary artery bifurcation (series 5, image 59). Right heart enlargement with elevated RV/LV ratio measuring 1.7. Small pericardial effusion. No thoracic aortic aneurysm. Mediastinum/Nodes: No enlarged mediastinal, hilar, or axillary lymph nodes. Thyroid gland, trachea, and esophagus demonstrate no significant findings. Lungs/Pleura: Small wedge-shaped areas of increased density in the peripheral anterior medial left upper lobe and anterior right lower lobe along the major fissure. No focal consolidation, pleural effusion, or pneumothorax. Musculoskeletal: No chest wall abnormality. No acute or significant osseous findings. Review of the MIP images confirms the above findings. CT ABDOMEN AND PELVIS FINDINGS Hepatobiliary: Diffusely heterogeneous liver parenchyma, likely perfusion related. No focal liver abnormality. The gallbladder is unremarkable. No biliary dilatation. Pancreas: Unremarkable. No pancreatic ductal dilatation or surrounding inflammatory changes. Spleen: Normal in size without focal abnormality. Adrenals/Urinary Tract: Adrenal glands are unremarkable. Kidneys are normal, without renal calculi, focal lesion, or hydronephrosis. Bladder is unremarkable. Stomach/Bowel: Inflammatory changes of the cecum and appendiceal stump with wall thickening and mucosal enhancement, mildly progressed since the prior study. There is new severe wall thickening and mucosal enhancement of the distal and terminal ileum. The stomach and remaining small bowel are unremarkable. No obstruction. Vascular/Lymphatic: No significant vascular findings. There are multiple mildly  enlarged mesenteric lymph nodes measuring up to 1.3 cm in short axis, increased since the prior study. Reproductive: Prostate is unremarkable. Other: Unchanged trace free fluid in the pelvis. No pneumoperitoneum. Musculoskeletal: No acute or significant osseous findings. Review of the MIP images confirms the above findings. IMPRESSION: CHEST: 1. Extensive acute bilateral pulmonary emboli with CT evidence of right heart strain (RV/LV Ratio 1.7) consistent with at least submassive (intermediate risk) PE. The presence of right heart strain has been associated with an increased risk of morbidity and mortality. 2. Small wedge-shaped areas of increased density in the peripheral anterior medial left upper lobe and anterior right lower lobe along the major fissure could reflect small pulmonary infarcts. 3. Small pericardial effusion. ABDOMEN AND PELVIS: 1.  Progressive inflammatory changes of the cecum and appendiceal stump with new severe wall thickening and mucosal enhancement of the distal and terminal ileum, concerning for inflammatory bowel disease. No perforation or drainable fluid collection. 2. Progressive mesenteric lymphadenopathy, likely reactive. Critical Value/emergent results were called by telephone at the time of interpretation on 05/23/2020 at 2:51 pm to provider [facility] , who verbally acknowledged these results. Electronically Signed   By: Obie Dredge M.D.   On: 05/23/2020 14:54   CT ABDOMEN PELVIS W CONTRAST  Result Date: 05/23/2020 CLINICAL DATA:  Exertion ule shortness of breath for the past 3 days. Right lower quadrant abdominal pain. EXAM: CT ANGIOGRAPHY CHEST CT ABDOMEN AND PELVIS WITH CONTRAST TECHNIQUE: Multidetector CT imaging of the chest was performed using the standard protocol during bolus administration of intravenous contrast. Multiplanar CT image reconstructions and MIPs were obtained to evaluate the vascular anatomy. Multidetector CT imaging of the abdomen and pelvis was  performed using the standard protocol during bolus administration of intravenous contrast. CONTRAST:  100 mL Omnipaque 350 intravenous contrast. COMPARISON:  Chest x-ray from same day. CT abdomen pelvis dated October 15, 2017. FINDINGS: CTA CHEST FINDINGS Cardiovascular: Satisfactory opacification of the pulmonary arteries to the segmental level. Extensive acute bilateral pulmonary emboli involving the distal right and left pulmonary arteries as well as the lobar and segmental pulmonary arteries of all lobes in both lungs. Thin, tiny embolism at the main pulmonary artery bifurcation (series 5, image 59). Right heart enlargement with elevated RV/LV ratio measuring 1.7. Small pericardial effusion. No thoracic aortic aneurysm. Mediastinum/Nodes: No enlarged mediastinal, hilar, or axillary lymph nodes. Thyroid gland, trachea, and esophagus demonstrate no significant findings. Lungs/Pleura: Small wedge-shaped areas of increased density in the peripheral anterior medial left upper lobe and anterior right lower lobe along the major fissure. No focal consolidation, pleural effusion, or pneumothorax. Musculoskeletal: No chest wall abnormality. No acute or significant osseous findings. Review of the MIP images confirms the above findings. CT ABDOMEN AND PELVIS FINDINGS Hepatobiliary: Diffusely heterogeneous liver parenchyma, likely perfusion related. No focal liver abnormality. The gallbladder is unremarkable. No biliary dilatation. Pancreas: Unremarkable. No pancreatic ductal dilatation or surrounding inflammatory changes. Spleen: Normal in size without focal abnormality. Adrenals/Urinary Tract: Adrenal glands are unremarkable. Kidneys are normal, without renal calculi, focal lesion, or hydronephrosis. Bladder is unremarkable. Stomach/Bowel: Inflammatory changes of the cecum and appendiceal stump with wall thickening and mucosal enhancement, mildly progressed since the prior study. There is new severe wall thickening and  mucosal enhancement of the distal and terminal ileum. The stomach and remaining small bowel are unremarkable. No obstruction. Vascular/Lymphatic: No significant vascular findings. There are multiple mildly enlarged mesenteric lymph nodes measuring up to 1.3 cm in short axis, increased since the prior study. Reproductive: Prostate is unremarkable. Other: Unchanged trace free fluid in the pelvis. No pneumoperitoneum. Musculoskeletal: No acute or significant osseous findings. Review of the MIP images confirms the above findings. IMPRESSION: CHEST: 1. Extensive acute bilateral pulmonary emboli with CT evidence of right heart strain (RV/LV Ratio 1.7) consistent with at least submassive (intermediate risk) PE. The presence of right heart strain has been associated with an increased risk of morbidity and mortality. 2. Small wedge-shaped areas of increased density in the peripheral anterior medial left upper lobe and anterior right lower lobe along the major fissure could reflect small pulmonary infarcts. 3. Small pericardial effusion. ABDOMEN AND PELVIS: 1. Progressive inflammatory changes of the cecum and appendiceal stump with new severe wall thickening and mucosal enhancement of the distal and  terminal ileum, concerning for inflammatory bowel disease. No perforation or drainable fluid collection. 2. Progressive mesenteric lymphadenopathy, likely reactive. Critical Value/emergent results were called by telephone at the time of interpretation on 05/23/2020 at 2:51 pm to provider [facility] , who verbally acknowledged these results. Electronically Signed   By: Obie Dredge M.D.   On: 05/23/2020 14:54     Assessment and Plan:   Active Problems:   Pulmonary emboli [facility])  Patient presents with LLE DVT and bilateral pulmonary emboli.  Concern was raised for possibility of hemodynamic decompensation, particularly with moderate pericardial effusion.  I have imaged the patient at the bedside.  The pericardial fluid  appears circumferential and primarily inferior, with current RV pressure load it is difficult to assess for constrictive physiology.  There are no fibrinous strands and no apparent coagulum in the pericardial fluid to suggest a hemorrhagic or clearly inflammatory etiology.  It is difficult to know the etiology of the pericardial effusion.  Concern was raised for administration of TPA with possible inflammatory pericardial process. If patient becomes unstable requiring TPA, risk benefit assessment may favor giving TPA. Echo can be assessed at that time for any change in pericardial effusion. IR has been informed of patient. Critical care plans to watch the patient closely for hemodynamic compromise from the standpoint of pulmonary emboli but also as the RV pressure decreases, if the pericardial effusion becomes hemodynamically significant.  No clear indication at this time for pericardiocentesis.  Discussed with the on-call cardiologist for notification of patient's guarded course.  Risk Assessment/Risk Scores:      For questions or updates, please contact [facility] Please consult [email or web address] for contact info under    Signed, Parke Poisson, MD  05/23/2020 4:33 PM  CRITICAL CARE Performed by: Weston Brass, MD   Total critical care time: 35 minutes   Critical care time was exclusive of separately billable procedures and treating other patients.   Critical care was necessary to treat or prevent imminent or life-threatening deterioration.   Critical care was time spent personally by me (independent of APPs or residents) on the following activities: development of treatment plan with patient and/or surrogate as well as nursing, discussions with consultants, evaluation of patient's response to treatment, examination of patient, obtaining history from patient or surrogate, ordering and performing treatments and interventions, ordering and review of laboratory studies, ordering and review  of radiographic studies, pulse oximetry and re-evaluation of patient's condition.

## 2020-05-23 NOTE — ED Triage Notes (Signed)
Pt reports hehas had SHOB ,N/V,diarrhea and ABD pain last 3 days. Pt reports his appendix is ruptured and he has been waiting for surgery. Pt in no acute distress.

## 2020-05-23 NOTE — ED Provider Notes (Signed)
[facility] EMERGENCY DEPARTMENT Provider Note   CSN: [id number] Arrival date & time: 05/23/20  1003     History Chief Complaint  Patient presents with  . Abdominal Pain    PAPA PIERCEFIELD III is a 20 y.o. male.  HPI   Pt is a 20 y/o male with a h/o asthma, headache perforated appendicitis who presents to the ED today for eval of shortness of breath that started 4-5 days ago. Reports associated cough, dyspnea on exertion, lightheadedness. Denies chest pain, wheezing or pleuritic pain. Reports intermittent nausea, vomiting mostly in the mornings. He is able to eat later on in the day without vomiting. Reports some associated diarrhea. Denies fevers, constipation, hematochezia, dysuria, frequency, urgency.  States he had a perforated appendicitis several years ago and since then he has had intermittent abdominal pain since then. He states that he had some yesterday and called Dr. Ewing Schlein who advised him to come to the ED.   Pt had COVID 4-5 months ago. Denies leg pain/swelling, hemoptysis, recent surgery/trauma, recent long travel, hormone use, personal hx of cancer, or hx of DVT/PE.   Past Medical History:  Diagnosis Date  . Asthma   . YJEHUDJS(970.2)     Patient Active Problem List   Diagnosis Date Noted  . Pulmonary emboli (HCC) 05/23/2020  . Abdominal pain 10/15/2017  . Perforated appendicitis 10/01/2017  . Migraine without aura and with status migrainosus, not intractable 10/19/2012  . ADHD (attention deficit hyperactivity disorder) 10/19/2012  . 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 CIRCUMCISION         Family History  Problem Relation Age of Onset  . Seizures Cousin        Epilepsy    Social History   Tobacco Use  . Smoking status: Current Some Day Smoker    Types: E-cigarettes  . Smokeless tobacco: Never Used  Vaping Use  . Vaping Use: Some days   Substance Use Topics  . Alcohol use: No  . Drug use: Never    Home Medications Prior to Admission medications   Medication Sig Start Date End Date Taking? Authorizing Provider  mesalamine (APRISO) 0.375 g 24 hr capsule Take 0.375 g by mouth every morning. 11/29/19  Yes [provider]  acetaminophen (TYLENOL) 325 MG tablet Take 2 tablets (650 mg total) by mouth every 6 (six) hours as needed. Patient not taking: Reported on 05/23/2020 10/16/17   Sherrie George, PA-C  benzonatate (TESSALON) 100 MG capsule Take 1-2 capsules (100-200 mg total) by mouth 3 (three) times daily as needed. Patient not taking: Reported on 05/23/2020 11/20/19   Wallis Bamberg, PA-C  ondansetron (ZOFRAN) 4 MG tablet Take 1 tablet (4 mg total) by mouth every 6 (six) hours. Patient not taking: Reported on 05/23/2020 11/04/19   Moshe Cipro, NP  promethazine-dextromethorphan (PROMETHAZINE-DM) 6.25-15 MG/5ML syrup Take 5 mLs by mouth at bedtime as needed for cough. Patient not taking: Reported on 05/23/2020 11/20/19   Wallis Bamberg, PA-C  SUMAtriptan (IMITREX) 25 MG tablet Take 1 tablet (25 mg total) by mouth every 2 (two) hours as needed for migraine. Max 2 tabs a day or 3 tabs a week. Patient not taking: Reported on 05/23/2020 01/20/13   Keturah Shavers, MD    Allergies    Penicillins, Cefdinir, Corn-containing products, and Other  Review of Systems   Review of Systems  Constitutional: Negative for fever.  HENT: Negative for ear  pain and sore throat.   Eyes: Negative for visual disturbance.  Respiratory: Positive for cough and shortness of breath.   Cardiovascular: Negative for chest pain, palpitations and leg swelling.  Gastrointestinal: Positive for diarrhea. Negative for abdominal pain, blood in stool, constipation, nausea and vomiting.  Genitourinary: Negative for dysuria and hematuria.  Musculoskeletal: Negative for back pain.  Skin: Negative for rash.  Neurological: Positive for light-headedness.  Negative for syncope.  All other systems reviewed and are negative.   Physical Exam Updated Vital Signs BP 102/70   Pulse (!) 126   Temp 97.7 F (36.5 C)   Resp (!) 21   Ht 6\' 3"  (1.905 m)   Wt 104.3 kg   SpO2 100%   BMI 28.75 kg/m   Physical Exam Vitals and nursing note reviewed.  Constitutional:      Appearance: He is well-developed and well-nourished.  HENT:     Head: Normocephalic and atraumatic.  Eyes:     Conjunctiva/sclera: Conjunctivae normal.  Cardiovascular:     Rate and Rhythm: Regular rhythm. Tachycardia present.     Heart sounds: No murmur heard.   Pulmonary:     Effort: Pulmonary effort is normal. No respiratory distress.     Breath sounds: Normal breath sounds. No wheezing, rhonchi or rales.  Abdominal:     General: Bowel sounds are normal.     Palpations: Abdomen is soft.     Tenderness: There is abdominal tenderness in the right lower quadrant. There is guarding. There is no rebound.  Musculoskeletal:        General: No edema.     Cervical back: Neck supple.  Skin:    General: Skin is warm and dry.  Neurological:     Mental Status: He is alert.  Psychiatric:        Mood and Affect: Mood and affect normal.     ED Results / Procedures / Treatments   Labs (all labs ordered are listed, but only abnormal results are displayed) Labs Reviewed  COMPREHENSIVE METABOLIC PANEL - Abnormal; Notable for the following components:      Result Value   Sodium 133 (*)    Glucose, Bld 110 (*)    Calcium 7.8 (*)    Total Protein 6.1 (*)    Albumin 1.6 (*)    AST 11 (*)    All other components within normal limits  CBC - Abnormal; Notable for the following components:   Hemoglobin 10.7 (*)    HCT 33.4 (*)    MCV 68.4 (*)    MCH 21.9 (*)    RDW 19.2 (*)    Platelets 405 (*)    All other components within normal limits  URINALYSIS, ROUTINE W REFLEX MICROSCOPIC - Abnormal; Notable for the following components:   Color, Urine AMBER (*)    APPearance HAZY  (*)    All other components within normal limits  BRAIN NATRIURETIC PEPTIDE - Abnormal; Notable for the following components:   B Natriuretic Peptide 355.4 (*)    All other components within normal limits  RESP PANEL BY RT-PCR (FLU A&B, COVID) ARPGX2  LIPASE, BLOOD  TSH  HEPARIN LEVEL (UNFRACTIONATED)  HIV ANTIBODY (ROUTINE TESTING W REFLEX)  TROPONIN I (HIGH SENSITIVITY)    EKG EKG Interpretation  Date/Time:  Wednesday May 23 2020 10:12:36 EST Ventricular Rate:  140 PR Interval:  124 QRS Duration: 74 QT Interval:  292 QTC Calculation: 445 R Axis:   80 Text Interpretation: Sinus tachycardia Otherwise normal ECG Confirmed  by Lorre Nick ([id number]) on 05/23/2020 1:14:57 PM   Radiology DG Chest 2 View  Result Date: 05/23/2020 CLINICAL DATA:  Shortness of breath EXAM: CHEST - 2 VIEW COMPARISON:  None. FINDINGS: The lungs are clear. The heart size and pulmonary vascularity are normal. No adenopathy. There is lower thoracic dextroscoliosis. IMPRESSION: Lungs clear.  Cardiac silhouette normal. Electronically Signed   By: Bretta Bang III M.D.   On: 05/23/2020 09:32   CT Angio Chest PE W and/or Wo Contrast  Result Date: 05/23/2020 CLINICAL DATA:  Exertion ule shortness of breath for the past 3 days. Right lower quadrant abdominal pain. EXAM: CT ANGIOGRAPHY CHEST CT ABDOMEN AND PELVIS WITH CONTRAST TECHNIQUE: Multidetector CT imaging of the chest was performed using the standard protocol during bolus administration of intravenous contrast. Multiplanar CT image reconstructions and MIPs were obtained to evaluate the vascular anatomy. Multidetector CT imaging of the abdomen and pelvis was performed using the standard protocol during bolus administration of intravenous contrast. CONTRAST:  100 mL Omnipaque 350 intravenous contrast. COMPARISON:  Chest x-ray from same day. CT abdomen pelvis dated October 15, 2017. FINDINGS: CTA CHEST FINDINGS Cardiovascular: Satisfactory opacification of the  pulmonary arteries to the segmental level. Extensive acute bilateral pulmonary emboli involving the distal right and left pulmonary arteries as well as the lobar and segmental pulmonary arteries of all lobes in both lungs. Thin, tiny embolism at the main pulmonary artery bifurcation (series 5, image 59). Right heart enlargement with elevated RV/LV ratio measuring 1.7. Small pericardial effusion. No thoracic aortic aneurysm. Mediastinum/Nodes: No enlarged mediastinal, hilar, or axillary lymph nodes. Thyroid gland, trachea, and esophagus demonstrate no significant findings. Lungs/Pleura: Small wedge-shaped areas of increased density in the peripheral anterior medial left upper lobe and anterior right lower lobe along the major fissure. No focal consolidation, pleural effusion, or pneumothorax. Musculoskeletal: No chest wall abnormality. No acute or significant osseous findings. Review of the MIP images confirms the above findings. CT ABDOMEN AND PELVIS FINDINGS Hepatobiliary: Diffusely heterogeneous liver parenchyma, likely perfusion related. No focal liver abnormality. The gallbladder is unremarkable. No biliary dilatation. Pancreas: Unremarkable. No pancreatic ductal dilatation or surrounding inflammatory changes. Spleen: Normal in size without focal abnormality. Adrenals/Urinary Tract: Adrenal glands are unremarkable. Kidneys are normal, without renal calculi, focal lesion, or hydronephrosis. Bladder is unremarkable. Stomach/Bowel: Inflammatory changes of the cecum and appendiceal stump with wall thickening and mucosal enhancement, mildly progressed since the prior study. There is new severe wall thickening and mucosal enhancement of the distal and terminal ileum. The stomach and remaining small bowel are unremarkable. No obstruction. Vascular/Lymphatic: No significant vascular findings. There are multiple mildly enlarged mesenteric lymph nodes measuring up to 1.3 cm in short axis, increased since the prior study.  Reproductive: Prostate is unremarkable. Other: Unchanged trace free fluid in the pelvis. No pneumoperitoneum. Musculoskeletal: No acute or significant osseous findings. Review of the MIP images confirms the above findings. IMPRESSION: CHEST: 1. Extensive acute bilateral pulmonary emboli with CT evidence of right heart strain (RV/LV Ratio 1.7) consistent with at least submassive (intermediate risk) PE. The presence of right heart strain has been associated with an increased risk of morbidity and mortality. 2. Small wedge-shaped areas of increased density in the peripheral anterior medial left upper lobe and anterior right lower lobe along the major fissure could reflect small pulmonary infarcts. 3. Small pericardial effusion. ABDOMEN AND PELVIS: 1. Progressive inflammatory changes of the cecum and appendiceal stump with new severe wall thickening and mucosal enhancement of the distal and terminal ileum, concerning  for inflammatory bowel disease. No perforation or drainable fluid collection. 2. Progressive mesenteric lymphadenopathy, likely reactive. Critical Value/emergent results were called by telephone at the time of interpretation on 05/23/2020 at 2:51 pm to provider [facility] , who verbally acknowledged these results. Electronically Signed   By: Obie Dredge M.D.   On: 05/23/2020 14:54   CT ABDOMEN PELVIS W CONTRAST  Result Date: 05/23/2020 CLINICAL DATA:  Exertion ule shortness of breath for the past 3 days. Right lower quadrant abdominal pain. EXAM: CT ANGIOGRAPHY CHEST CT ABDOMEN AND PELVIS WITH CONTRAST TECHNIQUE: Multidetector CT imaging of the chest was performed using the standard protocol during bolus administration of intravenous contrast. Multiplanar CT image reconstructions and MIPs were obtained to evaluate the vascular anatomy. Multidetector CT imaging of the abdomen and pelvis was performed using the standard protocol during bolus administration of intravenous contrast. CONTRAST:  100 mL  Omnipaque 350 intravenous contrast. COMPARISON:  Chest x-ray from same day. CT abdomen pelvis dated October 15, 2017. FINDINGS: CTA CHEST FINDINGS Cardiovascular: Satisfactory opacification of the pulmonary arteries to the segmental level. Extensive acute bilateral pulmonary emboli involving the distal right and left pulmonary arteries as well as the lobar and segmental pulmonary arteries of all lobes in both lungs. Thin, tiny embolism at the main pulmonary artery bifurcation (series 5, image 59). Right heart enlargement with elevated RV/LV ratio measuring 1.7. Small pericardial effusion. No thoracic aortic aneurysm. Mediastinum/Nodes: No enlarged mediastinal, hilar, or axillary lymph nodes. Thyroid gland, trachea, and esophagus demonstrate no significant findings. Lungs/Pleura: Small wedge-shaped areas of increased density in the peripheral anterior medial left upper lobe and anterior right lower lobe along the major fissure. No focal consolidation, pleural effusion, or pneumothorax. Musculoskeletal: No chest wall abnormality. No acute or significant osseous findings. Review of the MIP images confirms the above findings. CT ABDOMEN AND PELVIS FINDINGS Hepatobiliary: Diffusely heterogeneous liver parenchyma, likely perfusion related. No focal liver abnormality. The gallbladder is unremarkable. No biliary dilatation. Pancreas: Unremarkable. No pancreatic ductal dilatation or surrounding inflammatory changes. Spleen: Normal in size without focal abnormality. Adrenals/Urinary Tract: Adrenal glands are unremarkable. Kidneys are normal, without renal calculi, focal lesion, or hydronephrosis. Bladder is unremarkable. Stomach/Bowel: Inflammatory changes of the cecum and appendiceal stump with wall thickening and mucosal enhancement, mildly progressed since the prior study. There is new severe wall thickening and mucosal enhancement of the distal and terminal ileum. The stomach and remaining small bowel are unremarkable. No  obstruction. Vascular/Lymphatic: No significant vascular findings. There are multiple mildly enlarged mesenteric lymph nodes measuring up to 1.3 cm in short axis, increased since the prior study. Reproductive: Prostate is unremarkable. Other: Unchanged trace free fluid in the pelvis. No pneumoperitoneum. Musculoskeletal: No acute or significant osseous findings. Review of the MIP images confirms the above findings. IMPRESSION: CHEST: 1. Extensive acute bilateral pulmonary emboli with CT evidence of right heart strain (RV/LV Ratio 1.7) consistent with at least submassive (intermediate risk) PE. The presence of right heart strain has been associated with an increased risk of morbidity and mortality. 2. Small wedge-shaped areas of increased density in the peripheral anterior medial left upper lobe and anterior right lower lobe along the major fissure could reflect small pulmonary infarcts. 3. Small pericardial effusion. ABDOMEN AND PELVIS: 1. Progressive inflammatory changes of the cecum and appendiceal stump with new severe wall thickening and mucosal enhancement of the distal and terminal ileum, concerning for inflammatory bowel disease. No perforation or drainable fluid collection. 2. Progressive mesenteric lymphadenopathy, likely reactive. Critical Value/emergent results were called  by telephone at the time of interpretation on 05/23/2020 at 2:51 pm to provider [facility] , who verbally acknowledged these results. Electronically Signed   By: Obie Dredge M.D.   On: 05/23/2020 14:54    Procedures Procedures   CRITICAL CARE Performed by: Karrie Meres   Total critical care time: 37 minutes  Critical care time was exclusive of separately billable procedures and treating other patients.  Critical care was necessary to treat or prevent imminent or life-threatening deterioration.  Critical care was time spent personally by me on the following activities: development of treatment plan with patient  and/or surrogate as well as nursing, discussions with consultants, evaluation of patient's response to treatment, examination of patient, obtaining history from patient or surrogate, ordering and performing treatments and interventions, ordering and review of laboratory studies, ordering and review of radiographic studies, pulse oximetry and re-evaluation of patient's condition.   Medications Ordered in ED Medications  heparin ADULT infusion 100 units/mL (25000 units/221mL) (1,700 Units/hr Intravenous New Bag/Given 05/23/20 1552)  lactated ringers infusion (has no administration in time range)  sodium chloride 0.9 % bolus 1,000 mL (1,000 mLs Intravenous New Bag/Given 05/23/20 1407)  iohexol (OMNIPAQUE) 350 MG/ML injection 100 mL (100 mLs Intravenous Contrast Given 05/23/20 1427)  heparin bolus via infusion 6,300 Units (6,300 Units Intravenous Bolus from Bag 05/23/20 1553)    ED Course  I have reviewed the triage vital signs and the nursing notes.  Pertinent labs & imaging results that were available during my care of the patient were reviewed by me and considered in my medical decision making (see chart for details).    MDM Rules/Calculators/A&P                          20 y/o M presenting for eval of dyspnea on exertion and abd pain.   Reviewed/interpreted labs CBC with mild anemia and thrombocytosis which appears chronic and stable CMP with mild hyponatremia, calcium, protein and albumin are low - albumin noted to be significantly low Lipase wnl BNP elevated above 300 COVID ordered at urgent care and pending  Reviewed/interpreted imaging CXR from urgent care neg for acute abnormality CTA chest/abd/pelvis - . Extensive acute bilateral pulmonary emboli with CT evidence of right heart strain (RV/LV Ratio 1.7) consistent with at least submassive (intermediate risk) PE. The presence of right heart strain has been associated with an increased risk of morbidity and mortality. 2. Small  wedge-shaped areas of increased density in the peripheral anterior medial left upper lobe and anterior right lower lobe along the major fissure could reflect small pulmonary infarcts. 3. Small pericardial effusion. ABDOMEN AND PELVIS: 1. Progressive inflammatory changes of the cecum and appendiceal stump with new severe wall thickening and mucosal enhancement of the distal and terminal ileum, concerning for inflammatory bowel disease. No perforation or drainable fluid collection. 2. Progressive mesenteric lymphadenopathy, likely reactive.   Heparin ordered.  Will page intensivist  2:56 PM CONSULT With Rutherford Guys, PA with critical care medicine who will see the patient. Recommends adding a troponin.  Pt admitted to the critical care team.   Final Clinical Impression(s) / ED Diagnoses Final diagnoses:  Acute pulmonary embolism with acute cor pulmonale, unspecified pulmonary embolism type [facility])    Rx / DC Orders ED Discharge Orders    None       Rayne Du 05/23/20 1626    Lorre Nick, MD 05/28/20 [phone]

## 2020-05-23 NOTE — Plan of Care (Signed)

## 2020-05-23 NOTE — CV Procedure (Signed)
BLE venous duplex completed. Dr. Katrinka Blazing given preliminary findings at 1620.  Results can be found under chart review under CV PROC. 05/23/2020 4:48 PM Videl Nobrega RVT, RDMS

## 2020-05-23 NOTE — Progress Notes (Addendum)
ANTICOAGULATION CONSULT NOTE   Pharmacy Consult for Heparin Indication: Submassive pulmonary embolus  Labs: Recent Labs    05/23/20 1017 05/23/20 1458 05/23/20 2211  HGB 10.7*  --   --   HCT 33.4*  --   --   PLT 405*  --   --   HEPARINUNFRC  --   --  0.15*  CREATININE 1.20  --   --   TROPONINIHS  --  11 12   Assessment: 20 yo M presents with submassive PE with R heart strain (RV/LV ratio 1.7). Hgb low but stable around baseline at 10.7. Pltc 405. No AC noted PTA. Initial heparin level 0.15 units/ml.  No issues noted with infusion.  No bleeding reported  Goal of Therapy:  Heparin level 0.3-0.7 units/ml Monitor platelets by anticoagulation protocol: Yes   Plan:  Give heparin 3000 unit IV bolus Increase heparin infusion to 1900 units/hr Check 6-hr HL Monitor daily HL, CBC, s/sx bleeding  Thanks for allowing pharmacy to be a part of this patient's care.  Talbert Cage, PharmD Clinical Pharmacist 05/23/2020  11:44 PM   Addum:  Heparin level this am 0.20 units/ml.  No issues with infusion.  Pt reported weight 230 lb in ED but RN weighed today and only 173 lb Will rebolus 2000 units and inc heparin to 2100 units/hr.  Check heparin level in 6 hrs.

## 2020-05-23 NOTE — Progress Notes (Signed)
Echocardiogram 2D Echocardiogram has been performed.  Glenn [name] 05/23/2020, 3:54 PM   Dr. Mayford Knife notified of stat at 3:55

## 2020-05-23 NOTE — Discharge Instructions (Addendum)
Please go directly to the ER.

## 2020-05-23 NOTE — Progress Notes (Signed)
Chaplain requested by the RN, per the patient, for some information on Advanced Directive and Living Will.  Chaplain obtained the form and offered education and answered some questions.  Chaplain explained they can fill out tonight and day Chaplain will follow up for witnesses and Notary.  Chaplain available as needed. Chaplain Agustin Cree, Mdiv.     05/23/20 2018  Clinical Encounter Type  Visited With Patient and family together  Visit Type  (advanced directive information)  Referral From Nurse  Consult/Referral To Chaplain  Spiritual Encounters  Spiritual Needs Literature

## 2020-05-23 NOTE — ED Notes (Signed)
Patient transported to CT 

## 2020-05-23 NOTE — ED Notes (Signed)
Cancel Transporter @ 1700-bed not ready.

## 2020-05-23 NOTE — ED Provider Notes (Signed)
[facility]    CSN: [id number] Arrival date & time: 05/23/20  0809      History   Chief Complaint Chief Complaint  Patient presents with  . Shortness of Breath  . Abdominal Pain  . Nausea  . Emesis  . Diarrhea    HPI Glenn [name] is a 20 y.o. male.   HPI   SOB: Pt states that since Thursday he has had SOB, non-productive cough, nausea, vomiting and diarrhea. Main concern today is his SOB. He reports that he gets SOB with normal activities for the past few days. He reports that he only has abdominal pain before he vomits. Vomiting and diarrhea episodes have been mild and do not contain blood. He has not tried anything for symptoms. No known sick contacts. NO chest pain, calf pain, history of anemia. Of note he reports that 2 years ago he had a ruptured appendix in which he was given "medication" instead of having surgery as he "was in college and needed to get back".    Past Medical History:  Diagnosis Date  . Asthma   . AOZHYQMV(784.6)     Patient Active Problem List   Diagnosis Date Noted  . Abdominal pain 10/15/2017  . Perforated appendicitis 10/01/2017  . Migraine without aura and with status migrainosus, not intractable 10/19/2012  . ADHD (attention deficit hyperactivity disorder) 10/19/2012  . 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 CIRCUMCISION         Home Medications    Prior to Admission medications   Medication Sig Start Date End Date Taking? Authorizing Provider  acetaminophen (TYLENOL) 325 MG tablet Take 2 tablets (650 mg total) by mouth every 6 (six) hours as needed. 10/16/17   Sherrie George, PA-C  albuterol (PROVENTIL HFA;VENTOLIN HFA) 108 (90 Base) MCG/ACT inhaler Inhale 1-2 puffs into the lungs every 6 (six) hours as needed for wheezing or shortness of breath.    [provider]  benzonatate (TESSALON) 100 MG capsule Take 1-2  capsules (100-200 mg total) by mouth 3 (three) times daily as needed. 11/20/19   Wallis Bamberg, PA-C  ondansetron (ZOFRAN) 4 MG tablet Take 1 tablet (4 mg total) by mouth every 6 (six) hours. 11/04/19   Moshe Cipro, NP  promethazine-dextromethorphan (PROMETHAZINE-DM) 6.25-15 MG/5ML syrup Take 5 mLs by mouth at bedtime as needed for cough. 11/20/19   Wallis Bamberg, PA-C  SUMAtriptan (IMITREX) 25 MG tablet Take 1 tablet (25 mg total) by mouth every 2 (two) hours as needed for migraine. Max 2 tabs a day or 3 tabs a week. 01/20/13   Keturah Shavers, MD    Family History Family History  Problem Relation Age of Onset  . Seizures Cousin        Epilepsy    Social History Social History   Tobacco Use  . Smoking status: Current Some Day Smoker    Types: E-cigarettes  . Smokeless tobacco: Never Used  Vaping Use  . Vaping Use: Some days  Substance Use Topics  . Alcohol use: No  . Drug use: Never     Allergies   Penicillins and Other   Review of Systems Review of Systems  As stated above in HPI Physical Exam Triage Vital Signs ED Triage Vitals  Enc Vitals Group     BP 05/23/20 0829 (!) 99/57     Pulse Rate 05/23/20 0829 70     Resp 05/23/20 0829  18     Temp 05/23/20 0829 97.8 F (36.6 C)     Temp Source 05/23/20 0829 Oral     SpO2 05/23/20 0829 100 %     Weight --      Height --      Head Circumference --      Peak Flow --      Pain Score 05/23/20 0827 7     Pain Loc --      Pain Edu? --      Excl. in GC? --    No data found.  Updated Vital Signs BP (!) 99/57 (BP Location: Left Arm)   Pulse 70   Temp 97.8 F (36.6 C) (Oral)   Resp 18   SpO2 100%   Physical Exam Vitals and nursing note reviewed.  Constitutional:      Appearance: He is well-developed.     Interventions: He is not intubated. HENT:     Mouth/Throat:     Mouth: Mucous membranes are moist.     Pharynx: Oropharynx is clear. No pharyngeal swelling or oropharyngeal exudate.  Eyes:     Extraocular  Movements: Extraocular movements intact.     Pupils: Pupils are equal, round, and reactive to light.  Cardiovascular:     Rate and Rhythm: Normal rate and regular rhythm.  Pulmonary:     Effort: Pulmonary effort is normal. No tachypnea, bradypnea, accessory muscle usage or respiratory distress. He is not intubated.     Breath sounds: No stridor. Examination of the right-upper field reveals decreased breath sounds. Examination of the right-middle field reveals decreased breath sounds. Examination of the right-lower field reveals decreased breath sounds. Decreased breath sounds present. No wheezing, rhonchi or rales.  Chest:     Chest wall: No mass, tenderness or edema.  Abdominal:     General: Bowel sounds are increased.     Palpations: Abdomen is soft. There is no hepatomegaly, splenomegaly or mass.     Tenderness: There is abdominal tenderness (mild) in the left lower quadrant. There is no guarding or rebound. Positive signs include McBurney's sign. Negative signs include Murphy's sign, Rovsing's sign, psoas sign and obturator sign.     Hernia: No hernia is present.  Musculoskeletal:     Cervical back: Normal range of motion.  Lymphadenopathy:     Cervical: No cervical adenopathy.  Skin:    General: Skin is warm and dry.     Coloration: Skin is not cyanotic or pale.  Neurological:     Mental Status: He is alert.      UC Treatments / Results  Labs (all labs ordered are listed, but only abnormal results are displayed) Labs Reviewed  SARS CORONAVIRUS 2 (TAT 6-24 HRS)    EKG   Radiology DG Chest 2 View  Result Date: 05/23/2020 CLINICAL DATA:  Shortness of breath EXAM: CHEST - 2 VIEW COMPARISON:  None. FINDINGS: The lungs are clear. The heart size and pulmonary vascularity are normal. No adenopathy. There is lower thoracic dextroscoliosis. IMPRESSION: Lungs clear.  Cardiac silhouette normal. Electronically Signed   By: Bretta Bang [name] M.D.   On: 05/23/2020 09:32     Procedures Procedures (including critical care time)  Medications Ordered in UC Medications - No data to display  Initial Impression / Assessment and Plan / UC Course  I have reviewed the triage vital signs and the nursing notes.  Pertinent labs & imaging results that were available during my care of the patient were reviewed by me  and considered in my medical decision making (see chart for details).     New. Very mild tenderness to abdominal exam without rebound tenderness. He reports that this may be chronic for him. Chest x ray is negative for abnormality. Given his symptoms and normal chest x ray I would like for him to have further work up in the emergency room. He will be NPO.   Final Clinical Impressions(s) / UC Diagnoses   Final diagnoses:  SOB (shortness of breath)  RLQ abdominal pain     Discharge Instructions     Please go directly to the ER    ED Prescriptions    None     PDMP not reviewed this encounter.   Rushie Chestnut, [place] 05/23/20 [phone]

## 2020-05-23 NOTE — H&P (Addendum)
NAME:  Glenn [name], MRN:  [id number], DOB:  2000/11/30, LOS: 0 ADMISSION DATE:  05/23/2020, CONSULTATION DATE:  05/23/2020 REFERRING MD:  Freida Busman, CHIEF COMPLAINT:  SOB   Brief History:  20 year old male who presented with 5-day history of SOB/exertional dyspnea, found to have bilateral pulmonary emboli with c/f R heart strain (RV/LV 1.7) and acute LLE DVT.  History of Present Illness:  Glenn [name] is a 20 year old male with PMHx significant for asthma, migraines and Crohn's disease (diagnosed ~2019, managed with mesalamine). Presented to [facility] UC today for 5-day history of SOB/exertional dyspnea. Patient notes that he has not experienced this before. Does note ~15lb weight loss over the last 3-4 months, which he attributes to nausea/vomiting and PO intolerance associated with his Crohn's flares. Reports mild diarrhea and lightheadedness. Denies fever/chills, CP. Denies recent sick contacts, long periods of immobility (car/plane trips), personal or family history of DVT/VTE, current tobacco use (stopped vaping ~2 months ago).  On arrival to ED, patient was tachycardic to 140s, BP 116/70, RR 16 satting well on RA. Labs were unremarkable with stable Hgb (10.7), K 4.2, CO2 23, Cr 1.2, normal transaminases, normal troponins. CTA was completed demonstrating acute bilateral submassive PE with concern for R heart strain (RV/LV ratio 1.7). Echo was completed showing EF 30-35%, moderately enlarged RV, elevated PA systolic pressure (RVSP 46.66mmHg) and large pericardial effusion. LE Dopplers positive for L femoral/popliteal DVT. Cardiology was consulted. PCCM was consulted for admission.  Past Medical History:  Asthma, migraines, Crohn's disease  Significant Hospital Events:  05/23/2020 Presented to ED with SOB, CT with acute bilateral submassive PE, admitted  Consults:  Cardiology  Procedures:    Significant Diagnostic Tests:   2/23 CXR >> Lungs clear, cardiac silhouette normal.  2/23 CTA  Chest >> Extensive acute bilateral pulmonary emboli with e/o R heart strain (RV/LV 1.7) consistent with at least submassive (intermediate risk) PE; small wedge-shaped areas of increased density in the peripheral anterior medial left upper lobe and anterior right lower lobe along the major fissure could reflect small pulmonary infarcts; small pericardial effusion  2/23 CT A/P >> Progressive inflammatory changes of the cecum and appendiceal stump with new severe wall thickening and mucosal enhancement of the distal/terminal ileum, c/f IBD; no perforation or drainable fluid collection, progressive mesenteric lymphadenopathy  2/23 Echo >> EF 30-35%, moderately enlarged RV, elevated PA systolic pressure (RVSP 46.4mmHg). Mild RV diastolic collapse and large pericardial effusion.  2/23 LE Dopplers >> Acute L femoral and popliteal DVTs Micro Data:  2/23 COVID >>  Antimicrobials:    Interim History / Subjective:    Objective   Blood pressure 109/71, pulse (!) 132, temperature 97.7 F (36.5 C), resp. rate (!) 23, height 6\' 3"  (1.905 m), weight 104.3 kg, SpO2 99 %.       No intake or output data in the 24 hours ending 05/23/20 1700 Filed Weights   05/23/20 1500  Weight: 104.3 kg   Examination: General: Acutely ill-appearing young adult male, in NAD. HEENT: Anicteric sclera, moist mucous membranes. Neuro: A&Ox4, answering questions appropriately, speaking in full sentences. Moves all 4 extremities spontaneously. CV: Tachycardic, no m/g/r. PULM: Breathing even and unlabored on RA. Lung fields diminished bilaterally. Mild pectus carinatum. GI: Soft, mild diffuse TTP, nondistended. Extremities: Trace BLE edema. Skin: Warm/dry, no rashes.  Resolved Hospital Problem list     Assessment & Plan:  Acute bilateral pulmonary embolism Acute left lower extremity DVT Presented with 5-day history of SOB/exertional dyspnea. CT demonstrated acute bilateral PE  with CT e/o R heart strain. RV/LV Ratio  1.7. Echo with LVEF 30-35%; moderately enlarged RV, elevated PA systolic pressure (RVSP 46.79mmHg). Mild RV diastolic collapse. LE Dopplers with L femoral/popliteal DVT, no clear etiology of provocation. - Heparin gtt - Low threshold for thrombolytics vs. IR involvement if hemodynamically unstable/decompensating - Monitor respiratory status/O2 sats  Pericardial effusion Initially noted on CT, confirmed with Echo. Concern for risk of hemorrhagic conversion with thrombolytic administration for above PE. - Cardiology consulted, appreciate assistance - Repeat Echo 2/24AM - If clinically decompensating, plan would be for IR vs. Thrombolytics - If hemorrhagic conversion/bleed, would involve TCTS for pericardial window  Crohn's disease with frequent vomiting ?Perforated appendicitis (2019) Initially diagnosed in 2019 during time of appendiceal "perforation" (managed nonoperatively). Sounds like patient subsequently had colonoscopy with biopsy concerning for Crohn's. Query if IBD contributed to hypercoagulable state leading to PE. - Continue mesalamine - Consider GI consult for poorly controlled IBD once clinically stable  Acute weight loss Per patient, ~15lb weight loss over 3-4 months; patient associates this with Crohn's flares and nausea/vomiting with PO intolerance that comes with these. - Encourage PO intake - Continue home medications  Best practice (evaluated daily)  Diet: Regular Pain/Anxiety/Delirium protocol (if indicated): N/A VAP protocol (if indicated): N/A DVT prophylaxis: Therapeutic heparin GI prophylaxis: N/A Glucose control: N/A Mobility: Bedrest Disposition: ICU  Goals of Care:  Last date of multidisciplinary goals of care discussion: Pending Family and staff present:  Summary of discussion:  Follow up goals of care discussion due: 3/2 Code Status: Full  Labs   CBC: Recent Labs  Lab 05/23/20 1017  WBC 7.5  HGB 10.7*  HCT 33.4*  MCV 68.4*  PLT 405*     Basic Metabolic Panel: Recent Labs  Lab 05/23/20 1017  NA 133*  K 4.2  CL 100  CO2 23  GLUCOSE 110*  BUN 10  CREATININE 1.20  CALCIUM 7.8*   GFR: Estimated Creatinine Clearance: 127.3 mL/min (by C-G formula based on SCr of 1.2 mg/dL). Recent Labs  Lab 05/23/20 1017  WBC 7.5    Liver Function Tests: Recent Labs  Lab 05/23/20 1017  AST 11*  ALT 11  ALKPHOS 101  BILITOT 0.4  PROT 6.1*  ALBUMIN 1.6*   Recent Labs  Lab 05/23/20 1017  LIPASE 18   No results for input(s): AMMONIA in the last 168 hours.  ABG No results found for: PHART, PCO2ART, PO2ART, HCO3, TCO2, ACIDBASEDEF, O2SAT   Coagulation Profile: No results for input(s): INR, PROTIME in the last 168 hours.  Cardiac Enzymes: No results for input(s): CKTOTAL, CKMB, CKMBINDEX, TROPONINI in the last 168 hours.  HbA1C: No results found for: HGBA1C  CBG: No results for input(s): GLUCAP in the last 168 hours.  Review of Systems:     Past Medical History:  He,  has a past medical history of Asthma and Headache(784.0).   Surgical History:   Past Surgical History:  Procedure Laterality Date  . CIRCUMCISION       Social History:   reports that he has been smoking e-cigarettes. He has never used smokeless tobacco. He reports that he does not drink alcohol and does not use drugs.   Family History:  His family history includes Seizures in his cousin.   Allergies Allergies  Allergen Reactions  . Penicillins Hives  . Cefdinir Hives  . Corn-Containing Products   . Other     Seasonal, Pet Dander, Trees, Mold, Eggs, Corn, Red Dye     Home Medications  Prior to Admission medications   Medication Sig Start Date End Date Taking? Authorizing Provider  mesalamine (APRISO) 0.375 g 24 hr capsule Take 0.375 g by mouth every morning. 11/29/19  Yes [provider]  acetaminophen (TYLENOL) 325 MG tablet Take 2 tablets (650 mg total) by mouth every 6 (six) hours as needed. Patient not taking:  Reported on 05/23/2020 10/16/17   Sherrie George, PA-C  benzonatate (TESSALON) 100 MG capsule Take 1-2 capsules (100-200 mg total) by mouth 3 (three) times daily as needed. Patient not taking: Reported on 05/23/2020 11/20/19   Wallis Bamberg, PA-C  ondansetron (ZOFRAN) 4 MG tablet Take 1 tablet (4 mg total) by mouth every 6 (six) hours. Patient not taking: Reported on 05/23/2020 11/04/19   Moshe Cipro, NP  promethazine-dextromethorphan (PROMETHAZINE-DM) 6.25-15 MG/5ML syrup Take 5 mLs by mouth at bedtime as needed for cough. Patient not taking: Reported on 05/23/2020 11/20/19   Wallis Bamberg, PA-C  SUMAtriptan (IMITREX) 25 MG tablet Take 1 tablet (25 mg total) by mouth every 2 (two) hours as needed for migraine. Max 2 tabs a day or 3 tabs a week. Patient not taking: Reported on 05/23/2020 01/20/13   Keturah Shavers, MD     Critical care time: 45 minutes   Tim Lair, PA-C [facility] 05/23/20 5:00 PM  Please see [email or web address] for pager details.

## 2020-05-23 NOTE — ED Notes (Signed)
Transporter(Teletracking) ordered @ 1643-per Idalia Needle, RN ordered by Marylene Land

## 2020-05-23 NOTE — Progress Notes (Signed)
ANTICOAGULATION CONSULT NOTE - Initial Consult  Pharmacy Consult for Heparin Indication: Submassive pulmonary embolus  Allergies  Allergen Reactions  . Penicillins Hives  . Cefdinir Hives  . Corn-Containing Products   . Other     Seasonal, Pet Dander, Trees, Mold, Eggs, Corn, Red Dye    Patient Measurements: Height: 6\' 3"  (190.5 cm) Weight: 104.3 kg (230 lb) IBW/kg (Calculated) : 84.5 Heparin Dosing Weight: 104.3 kg  Vital Signs: Temp: 97.7 F (36.5 C) (02/23 1010) Temp Source: Oral (02/23 0829) BP: 110/62 (02/23 1403) Pulse Rate: 123 (02/23 1403)  Labs: Recent Labs    05/23/20 1017  HGB 10.7*  HCT 33.4*  PLT 405*  CREATININE 1.20    Estimated Creatinine Clearance: 127.3 mL/min (by C-G formula based on SCr of 1.2 mg/dL).   Medical History: Past Medical History:  Diagnosis Date  . Asthma   . 05/25/20)     Assessment: 20 yo M presents with submassive PE with R heart strain (RV/LV ratio 1.7). Hgb low but stable around baseline at 10.7. Pltc 405. No AC noted PTA.  Goal of Therapy:  Heparin level 0.3-0.7 units/ml Monitor platelets by anticoagulation protocol: Yes   Plan:  Give heparin 6300 unit IV bolus Start heparin infusion at 1700 units/hr Check 6-hr HL Monitor daily HL, CBC, s/sx bleeding  36, PharmD, BCPS PGY2 Cardiology Pharmacy Resident Phone: [phone] 05/23/2020  3:12 PM  Please check [email or web address] for unit-specific pharmacy phone numbers.

## 2020-05-23 NOTE — ED Triage Notes (Signed)
Patient complains of exertional shortness of breath, lower abdominal pain over the last several days and significant weight loss over the last year. Patient alert, oriented, and in no apparent distress at this time.

## 2020-05-24 DIAGNOSIS — R570 Cardiogenic shock: Secondary | ICD-10-CM

## 2020-05-24 DIAGNOSIS — I313 Pericardial effusion (noninflammatory): Secondary | ICD-10-CM | POA: Diagnosis not present

## 2020-05-24 DIAGNOSIS — I2609 Other pulmonary embolism with acute cor pulmonale: Secondary | ICD-10-CM | POA: Diagnosis not present

## 2020-05-24 LAB — CBC
HCT: 31 % — ABNORMAL LOW (ref 39.0–52.0)
Hemoglobin: 9.7 g/dL — ABNORMAL LOW (ref 13.0–17.0)
MCH: 21.3 pg — ABNORMAL LOW (ref 26.0–34.0)
MCHC: 31.3 g/dL (ref 30.0–36.0)
MCV: 68 fL — ABNORMAL LOW (ref 80.0–100.0)
Platelets: 363 K/uL (ref 150–400)
RBC: 4.56 MIL/uL (ref 4.22–5.81)
RDW: 19.1 % — ABNORMAL HIGH (ref 11.5–15.5)
WBC: 6.4 K/uL (ref 4.0–10.5)
nRBC: 0 % (ref 0.0–0.2)

## 2020-05-24 LAB — BASIC METABOLIC PANEL WITH GFR
Anion gap: 8 (ref 5–15)
BUN: 10 mg/dL (ref 6–20)
CO2: 25 mmol/L (ref 22–32)
Calcium: 7.6 mg/dL — ABNORMAL LOW (ref 8.9–10.3)
Chloride: 101 mmol/L (ref 98–111)
Creatinine, Ser: 1.16 mg/dL (ref 0.61–1.24)
GFR, Estimated: >60 mL/min
Glucose, Bld: 102 mg/dL — ABNORMAL HIGH (ref 70–99)
Potassium: 4.2 mmol/L (ref 3.5–5.1)
Sodium: 134 mmol/L — ABNORMAL LOW (ref 135–145)

## 2020-05-24 LAB — MRSA PCR SCREENING: MRSA by PCR: NEGATIVE

## 2020-05-24 LAB — PHOSPHORUS: Phosphorus: 3.9 mg/dL (ref 2.5–4.6)

## 2020-05-24 LAB — HEPARIN LEVEL (UNFRACTIONATED)
Heparin Unfractionated: 0.2 IU/mL — ABNORMAL LOW (ref 0.30–0.70)
Heparin Unfractionated: 0.27 IU/mL — ABNORMAL LOW (ref 0.30–0.70)
Heparin Unfractionated: 0.37 [IU]/mL (ref 0.30–0.70)

## 2020-05-24 LAB — HIV ANTIBODY (ROUTINE TESTING W REFLEX): HIV Screen 4th Generation wRfx: NONREACTIVE

## 2020-05-24 LAB — MAGNESIUM: Magnesium: 1.7 mg/dL (ref 1.7–2.4)

## 2020-05-24 MED ORDER — HEPARIN BOLUS VIA INFUSION
2000.0000 [IU] | Freq: Once | INTRAVENOUS | Status: AC
  Administered 2020-05-24: 2000 [IU] via INTRAVENOUS
  Filled 2020-05-24: qty 2000

## 2020-05-24 MED ORDER — PANTOPRAZOLE SODIUM 40 MG PO TBEC
40.0000 mg | DELAYED_RELEASE_TABLET | Freq: Every day | ORAL | Status: DC
  Administered 2020-05-24 – 2020-05-29 (×6): 40 mg via ORAL
  Filled 2020-05-24 (×6): qty 1

## 2020-05-24 MED ORDER — MAGNESIUM SULFATE 2 GM/50ML IV SOLN
2.0000 g | Freq: Once | INTRAVENOUS | Status: AC
  Administered 2020-05-24: 2 g via INTRAVENOUS
  Filled 2020-05-24: qty 50

## 2020-05-24 MED ORDER — MESALAMINE ER 0.375 G PO CP24: 0.3750 g | ORAL_CAPSULE | Freq: Every morning | ORAL | Status: DC

## 2020-05-24 MED ORDER — MESALAMINE 400 MG PO CPDR
400.0000 mg | DELAYED_RELEASE_CAPSULE | Freq: Every day | ORAL | Status: DC
  Administered 2020-05-24 – 2020-05-29 (×6): 400 mg via ORAL
  Filled 2020-05-24 (×6): qty 1

## 2020-05-24 MED ORDER — INFLUENZA VAC SPLIT QUAD 0.5 ML IM SUSY
0.5000 mL | PREFILLED_SYRINGE | INTRAMUSCULAR | Status: AC
  Administered 2020-05-29: 0.5 mL via INTRAMUSCULAR
  Filled 2020-05-24: qty 0.5

## 2020-05-24 NOTE — Progress Notes (Signed)
ANTICOAGULATION CONSULT NOTE   Pharmacy Consult for Heparin Indication: Submassive pulmonary embolus  Labs: Recent Labs    05/23/20 1017 05/23/20 1458 05/23/20 2211 05/23/20 2211 05/24/20 0442 05/24/20 1232 05/24/20 2007  HGB 10.7*  --   --   --  9.7*  --   --   HCT 33.4*  --   --   --  31.0*  --   --   PLT 405*  --   --   --  363  --   --   HEPARINUNFRC  --   --  0.15*   < > 0.20* 0.27* 0.37  CREATININE 1.20  --   --   --  1.16  --   --   TROPONINIHS  --  11 12  --   --   --   --    < > = values in this interval not displayed.   Assessment: 20 yo M presents with submassive PE with R heart strain (RV/LV ratio 1.7). Pt reported weight 230 lb in ED but RN weighed 2/24 and only 173 lb  Hgb low but stable around baseline at 9.7. Pltc 363. No AC noted PTA. Hep lvl now within goal at 0.37 - would like more towards 0.5 d/t high clot burden, continued tachycardia   Goal of Therapy:  Heparin level 0.3-0.7 units/ml Monitor platelets by anticoagulation protocol: Yes   Plan:  Increase heparin infusion to 2350 units/hr Monitor daily HL, CBC, s/sx bleeding  Elmer Sow, PharmD, BCCCP Clinical Pharmacist [phone]  Please check AMION for all [facility] Pharmacy numbers  05/24/2020 8:49 PM

## 2020-05-24 NOTE — Progress Notes (Signed)
[facility] ADULT ICU REPLACEMENT PROTOCOL   The patient does apply for the [facility] Adult ICU Electrolyte Replacment Protocol based on the criteria listed below:   1. Is GFR >/= 30 ml/min? Yes.    Patient's GFR today is >60 2. Is SCr </= 2? Yes.   Patient's SCr is 1.2 ml/kg/hr 3. Did SCr increase >/= 0.5 in 24 hours? No. 4. Abnormal electrolyte(s): Magnesium 1.7  5. Ordered repletion with: Magnesium 2g IV 6. If a panic level lab has been reported, has the CCM MD in charge been notified? no.   Physician:   Danne Harbor 05/24/2020 6:20 AM

## 2020-05-24 NOTE — Progress Notes (Signed)
NAME:  Glenn [name], MRN:  [id number], DOB:  Dec 20, 2000, LOS: 1 ADMISSION DATE:  05/23/2020, CONSULTATION DATE:  05/23/2020 REFERRING MD:  EDP, CHIEF COMPLAINT:  SOB   Brief History:  20 year old male who presented with 5-day history of SOB/exertional dyspnea, found to have bilateral pulmonary emboli with c/f R heart strain (RV/LV 1.7) and acute LLE DVT.  Past Medical History:   Past Medical History:  Diagnosis Date  . Asthma   . Crohn's disease (HCC) 05/23/2020   Diagnosed ~2019  . Deep vein thrombosis (DVT) of left lower extremity (HCC) 05/23/2020   L femoral, L popliteal veins 05/23/2020  . Headache(784.0)   . Pericardial effusion 05/23/2020  . Pulmonary emboli (HCC) 05/23/2020   Bilateral with e/o R heart strain on CTA   Significant Hospital Events:  2/23> admitted to CCU for acute bilateral submassive PE  Consults:  PCCM Cardiology  Procedures:   Significant Diagnostic Tests:   2/23 CXR >> Lungs clear, cardiac silhouette normal.  2/23 CTA Chest >> Extensive acute bilateral pulmonary emboli with e/o R heart strain (RV/LV 1.7) consistent with at least submassive (intermediate risk) PE; small wedge-shaped areas of increased density in the peripheral anterior medial left upper lobe and anterior right lower lobe along the major fissure could reflect small pulmonary infarcts; small pericardial effusion  2/23 CT A/P >> Progressive inflammatory changes of the cecum and appendiceal stump with new severe wall thickening and mucosal enhancement of the distal/terminal ileum, c/f IBD; no perforation or drainable fluid collection, progressive mesenteric lymphadenopathy  2/23 Echo >> EF 30-35%, moderately enlarged RV, elevated PA systolic pressure (RVSP 46.46mmHg). Mild RV diastolic collapse and large pericardial effusion.  2/23 LE Dopplers >> Acute L femoral and popliteal DVTs  Micro Data:  2/23 COVID, Flu A/B >> Neg  Antimicrobials:  None   Interim History / Subjective:   Overnight, no acute events. Breathing is improved. Currently on room air   Objective   Blood pressure 104/75, pulse (!) 122, temperature 98.4 F (36.9 C), temperature source Oral, resp. rate (!) 25, height 6\' 3"  (1.905 m), weight 78.5 kg, SpO2 100 %.        Intake/Output Summary (Last 24 hours) at 05/24/2020 0740 Last data filed at 05/24/2020 0700 Gross per 24 hour  Intake 1020 ml  Output 675 ml  Net 345 ml   Filed Weights   05/23/20 1500 05/24/20 0353  Weight: 104.3 kg 78.5 kg    Examination: General: young generally well appearing male, no acute distress3 HENT: normocephalic, atraumatic, EOMI, moist mucus membranes Lungs: clear to auscultation bilaterally, on room air  Cardiovascular: Tachycardic, no murmurs/rubs/gallops Abdomen: nondistended, soft, nontender, active bowel sounds Extremities: warm/dry, distal pulses intact; no peripheral edema Neuro: alert/orientedx4, spontaneously moving all extremities  Resolved Hospital Problem list    Assessment & Plan:  Acute bilateral pulmonary embolism Acute left lower extremity DVT Presented with 5 day history of exertional dyspnea. Noted to have acute bilateral PE with evidence of R heart strain. LE Dopplers with L femoral/popliteal DVT for which he was started on heparin gtt. He remains tachycardic and slightly hypotensive but currently on room air.  - Continue IV heparin gtt - Monitor respiratory status  Pericardial effusion Initially noted on CT and confirmed via Echo. Echo also noted to have decreased EF to 30-35%.  Concerns for possible hemorrhagic transformation of pericardial effusion with tamponade with thrombolytics. However, has been stable thus far. However, will require continued close monitoring for any hemodynamic compromise in  the ICU - Cardiology consulted, appreciate recommendations - Repeat Echo this AM - Continue cardiac monitoring   Crohn's disease  Initially diagnosed in 2019. Hx of frequent flares.  Currently stable and tolerating diet. - Continue mesalamine   Best practice (evaluated daily)  Diet: Regular Pain/Anxiety/Delirium protocol (if indicated): None VAP protocol (if indicated): None DVT prophylaxis: Heparin gtt GI prophylaxis: None Glucose control: None Mobility: Bedrest Disposition:ICU  Goals of Care:   Code Status: FULL   Critical care time: 30 minutes    Eliezer Bottom, MD Internal Medicine, PGY-2 05/24/20 10:19 AM Pager # [phone]

## 2020-05-24 NOTE — Progress Notes (Signed)
ANTICOAGULATION CONSULT NOTE   Pharmacy Consult for Heparin Indication: Submassive pulmonary embolus  Labs: Recent Labs    05/23/20 1017 05/23/20 1458 05/23/20 2211 05/24/20 0442 05/24/20 1232  HGB 10.7*  --   --  9.7*  --   HCT 33.4*  --   --  31.0*  --   PLT 405*  --   --  363  --   HEPARINUNFRC  --   --  0.15* 0.20* 0.27*  CREATININE 1.20  --   --  1.16  --   TROPONINIHS  --  11 12  --   --    Assessment: 20 yo M presents with submassive PE with R heart strain (RV/LV ratio 1.7). Pt reported weight 230 lb in ED but RN weighed 2/24 and only 173 lb  Hgb low but stable around baseline at 9.7. Pltc 363. No AC noted PTA. 6 hour HL slightly subtherapeutic at 0.27.  Rebolus and increase rate to treat aggressively.  No issues noted with infusion.  No bleeding reported  Goal of Therapy:  Heparin level 0.3-0.7 units/ml Monitor platelets by anticoagulation protocol: Yes   Plan:  Give heparin 2000 unit IV bolus Increase heparin infusion to 2300 units/hr Check 6-hr HL Monitor daily HL, CBC, s/sx bleeding  Thanks for allowing pharmacy to be a part of this patient's care.  Rexford Maus, PharmD PGY-1 Acute Care Pharmacy Resident Office: [phone] 05/24/2020 2:16 PM

## 2020-05-24 NOTE — Plan of Care (Signed)

## 2020-05-24 NOTE — Progress Notes (Addendum)
Progress Note  Patient Name: Glenn [name] Date of Encounter: 05/24/2020  [facility] Cardiologist: No primary care provider on file.   Subjective   Breathing is improving.  He was able to sleep and take a deep breath for the first time in a long time last night.   Inpatient Medications    Scheduled Meds: . Chlorhexidine Gluconate Cloth  6 each Topical Daily   Continuous Infusions: . heparin 2,100 Units/hr (05/24/20 0603)  . lactated ringers 100 mL/hr at 05/24/20 0353   PRN Meds:    Vital Signs    Vitals:   05/24/20 0500 05/24/20 0600 05/24/20 0700 05/24/20 0725  BP: 96/70 95/60 104/75   Pulse: (!) 122 (!) 119 (!) 122   Resp: (!) 29 11 (!) 25   Temp:    98.4 F (36.9 C)  TempSrc:    Oral  SpO2: 98% 96% 100%   Weight:      Height:        Intake/Output Summary (Last 24 hours) at 05/24/2020 0959 Last data filed at 05/24/2020 0700 Gross per 24 hour  Intake 1020 ml  Output 675 ml  Net 345 ml   Last 3 Weights 05/24/2020 05/23/2020 10/14/2017  Weight (lbs) 173 lb 1 oz 230 lb 250 lb  Weight (kg) 78.5 kg 104.327 kg 113.399 kg      Telemetry    Sinus tachycardia.  Rates improved from 140s to 110s.  - Personally Reviewed  ECG    Sinus tachycardia.  Rate 140 bpm. - Personally Reviewed  Physical Exam   VS:  BP 104/75   Pulse (!) 122   Temp 98.4 F (36.9 C) (Oral)   Resp (!) 25   Ht  (1.905 m)   Wt 78.5 kg   SpO2 100%   BMI 21.63 kg/m  , BMI Body mass index is 21.63 kg/m. GENERAL:  Well appearing HEENT: Pupils equal round and reactive, fundi not visualized, oral mucosa unremarkable NECK:  No jugular venous distention, waveform within normal limits, carotid upstroke brisk and symmetric, no bruits LUNGS:  Clear to auscultation bilaterally HEART:  Tachycardic.  Regular rhtyhm.  PMI not displaced or sustained,S1 and S2 within normal limits, + S3, no S4, no clicks, no rubs, no murmurs ABD:  Flat, positive bowel sounds normal in frequency in pitch,  no bruits, no rebound, no guarding, no midline pulsatile mass, no hepatomegaly, no splenomegaly EXT:  2 plus pulses throughout, no edema, no cyanosis no clubbing SKIN:  No rashes no nodules NEURO:  Cranial nerves II through XII grossly intact, motor grossly intact throughout PSYCH:  Cognitively intact, oriented to person place and time   Labs    High Sensitivity Troponin:   Recent Labs  Lab 05/23/20 1458 05/23/20 2211  TROPONINIHS 11 12      Chemistry Recent Labs  Lab 05/23/20 1017 05/24/20 0442  NA 133* 134*  K 4.2 4.2  CL 100 101  CO2 23 25  GLUCOSE 110* 102*  BUN 10 10  CREATININE 1.20 1.16  CALCIUM 7.8* 7.6*  PROT 6.1*  --   ALBUMIN 1.6*  --   AST 11*  --   ALT 11  --   ALKPHOS 101  --   BILITOT 0.4  --   GFRNONAA >60 >60  ANIONGAP 10 8     Hematology Recent Labs  Lab 05/23/20 1017 05/24/20 0442  WBC 7.5 6.4  RBC 4.88 4.56  HGB 10.7* 9.7*  HCT 33.4* 31.0*  MCV  68.4* 68.0*  MCH 21.9* 21.3*  MCHC 32.0 31.3  RDW 19.2* 19.1*  PLT 405* 363    BNP Recent Labs  Lab 05/23/20 1307  BNP 355.4*     DDimer No results for input(s): DDIMER in the last 168 hours.   Radiology    DG Chest 2 View  Result Date: 05/23/2020 CLINICAL DATA:  Shortness of breath EXAM: CHEST - 2 VIEW COMPARISON:  None. FINDINGS: The lungs are clear. The heart size and pulmonary vascularity are normal. No adenopathy. There is lower thoracic dextroscoliosis. IMPRESSION: Lungs clear.  Cardiac silhouette normal. Electronically Signed   By: Bretta Bang [name] M.D.   On: 05/23/2020 09:32   CT Angio Chest PE W and/or Wo Contrast  Result Date: 05/23/2020 CLINICAL DATA:  Exertion ule shortness of breath for the past 3 days. Right lower quadrant abdominal pain. EXAM: CT ANGIOGRAPHY CHEST CT ABDOMEN AND PELVIS WITH CONTRAST TECHNIQUE: Multidetector CT imaging of the chest was performed using the standard protocol during bolus administration of intravenous contrast. Multiplanar CT image  reconstructions and MIPs were obtained to evaluate the vascular anatomy. Multidetector CT imaging of the abdomen and pelvis was performed using the standard protocol during bolus administration of intravenous contrast. CONTRAST:  100 mL Omnipaque 350 intravenous contrast. COMPARISON:  Chest x-ray from same day. CT abdomen pelvis dated October 15, 2017. FINDINGS: CTA CHEST FINDINGS Cardiovascular: Satisfactory opacification of the pulmonary arteries to the segmental level. Extensive acute bilateral pulmonary emboli involving the distal right and left pulmonary arteries as well as the lobar and segmental pulmonary arteries of all lobes in both lungs. Thin, tiny embolism at the main pulmonary artery bifurcation (series 5, image 59). Right heart enlargement with elevated RV/LV ratio measuring 1.7. Small pericardial effusion. No thoracic aortic aneurysm. Mediastinum/Nodes: No enlarged mediastinal, hilar, or axillary lymph nodes. Thyroid gland, trachea, and esophagus demonstrate no significant findings. Lungs/Pleura: Small wedge-shaped areas of increased density in the peripheral anterior medial left upper lobe and anterior right lower lobe along the major fissure. No focal consolidation, pleural effusion, or pneumothorax. Musculoskeletal: No chest wall abnormality. No acute or significant osseous findings. Review of the MIP images confirms the above findings. CT ABDOMEN AND PELVIS FINDINGS Hepatobiliary: Diffusely heterogeneous liver parenchyma, likely perfusion related. No focal liver abnormality. The gallbladder is unremarkable. No biliary dilatation. Pancreas: Unremarkable. No pancreatic ductal dilatation or surrounding inflammatory changes. Spleen: Normal in size without focal abnormality. Adrenals/Urinary Tract: Adrenal glands are unremarkable. Kidneys are normal, without renal calculi, focal lesion, or hydronephrosis. Bladder is unremarkable. Stomach/Bowel: Inflammatory changes of the cecum and appendiceal stump with  wall thickening and mucosal enhancement, mildly progressed since the prior study. There is new severe wall thickening and mucosal enhancement of the distal and terminal ileum. The stomach and remaining small bowel are unremarkable. No obstruction. Vascular/Lymphatic: No significant vascular findings. There are multiple mildly enlarged mesenteric lymph nodes measuring up to 1.3 cm in short axis, increased since the prior study. Reproductive: Prostate is unremarkable. Other: Unchanged trace free fluid in the pelvis. No pneumoperitoneum. Musculoskeletal: No acute or significant osseous findings. Review of the MIP images confirms the above findings. IMPRESSION: CHEST: 1. Extensive acute bilateral pulmonary emboli with CT evidence of right heart strain (RV/LV Ratio 1.7) consistent with at least submassive (intermediate risk) PE. The presence of right heart strain has been associated with an increased risk of morbidity and mortality. 2. Small wedge-shaped areas of increased density in the peripheral anterior medial left upper lobe and anterior right lower lobe  along the major fissure could reflect small pulmonary infarcts. 3. Small pericardial effusion. ABDOMEN AND PELVIS: 1. Progressive inflammatory changes of the cecum and appendiceal stump with new severe wall thickening and mucosal enhancement of the distal and terminal ileum, concerning for inflammatory bowel disease. No perforation or drainable fluid collection. 2. Progressive mesenteric lymphadenopathy, likely reactive. Critical Value/emergent results were called by telephone at the time of interpretation on 05/23/2020 at 2:51 pm to provider [facility] , who verbally acknowledged these results. Electronically Signed   By: Obie Dredge M.D.   On: 05/23/2020 14:54   CT ABDOMEN PELVIS W CONTRAST  Result Date: 05/23/2020 CLINICAL DATA:  Exertion ule shortness of breath for the past 3 days. Right lower quadrant abdominal pain. EXAM: CT ANGIOGRAPHY CHEST CT  ABDOMEN AND PELVIS WITH CONTRAST TECHNIQUE: Multidetector CT imaging of the chest was performed using the standard protocol during bolus administration of intravenous contrast. Multiplanar CT image reconstructions and MIPs were obtained to evaluate the vascular anatomy. Multidetector CT imaging of the abdomen and pelvis was performed using the standard protocol during bolus administration of intravenous contrast. CONTRAST:  100 mL Omnipaque 350 intravenous contrast. COMPARISON:  Chest x-ray from same day. CT abdomen pelvis dated October 15, 2017. FINDINGS: CTA CHEST FINDINGS Cardiovascular: Satisfactory opacification of the pulmonary arteries to the segmental level. Extensive acute bilateral pulmonary emboli involving the distal right and left pulmonary arteries as well as the lobar and segmental pulmonary arteries of all lobes in both lungs. Thin, tiny embolism at the main pulmonary artery bifurcation (series 5, image 59). Right heart enlargement with elevated RV/LV ratio measuring 1.7. Small pericardial effusion. No thoracic aortic aneurysm. Mediastinum/Nodes: No enlarged mediastinal, hilar, or axillary lymph nodes. Thyroid gland, trachea, and esophagus demonstrate no significant findings. Lungs/Pleura: Small wedge-shaped areas of increased density in the peripheral anterior medial left upper lobe and anterior right lower lobe along the major fissure. No focal consolidation, pleural effusion, or pneumothorax. Musculoskeletal: No chest wall abnormality. No acute or significant osseous findings. Review of the MIP images confirms the above findings. CT ABDOMEN AND PELVIS FINDINGS Hepatobiliary: Diffusely heterogeneous liver parenchyma, likely perfusion related. No focal liver abnormality. The gallbladder is unremarkable. No biliary dilatation. Pancreas: Unremarkable. No pancreatic ductal dilatation or surrounding inflammatory changes. Spleen: Normal in size without focal abnormality. Adrenals/Urinary Tract: Adrenal  glands are unremarkable. Kidneys are normal, without renal calculi, focal lesion, or hydronephrosis. Bladder is unremarkable. Stomach/Bowel: Inflammatory changes of the cecum and appendiceal stump with wall thickening and mucosal enhancement, mildly progressed since the prior study. There is new severe wall thickening and mucosal enhancement of the distal and terminal ileum. The stomach and remaining small bowel are unremarkable. No obstruction. Vascular/Lymphatic: No significant vascular findings. There are multiple mildly enlarged mesenteric lymph nodes measuring up to 1.3 cm in short axis, increased since the prior study. Reproductive: Prostate is unremarkable. Other: Unchanged trace free fluid in the pelvis. No pneumoperitoneum. Musculoskeletal: No acute or significant osseous findings. Review of the MIP images confirms the above findings. IMPRESSION: CHEST: 1. Extensive acute bilateral pulmonary emboli with CT evidence of right heart strain (RV/LV Ratio 1.7) consistent with at least submassive (intermediate risk) PE. The presence of right heart strain has been associated with an increased risk of morbidity and mortality. 2. Small wedge-shaped areas of increased density in the peripheral anterior medial left upper lobe and anterior right lower lobe along the major fissure could reflect small pulmonary infarcts. 3. Small pericardial effusion. ABDOMEN AND PELVIS: 1. Progressive inflammatory changes of  the cecum and appendiceal stump with new severe wall thickening and mucosal enhancement of the distal and terminal ileum, concerning for inflammatory bowel disease. No perforation or drainable fluid collection. 2. Progressive mesenteric lymphadenopathy, likely reactive. Critical Value/emergent results were called by telephone at the time of interpretation on 05/23/2020 at 2:51 pm to provider [facility] , who verbally acknowledged these results. Electronically Signed   By: Obie Dredge M.D.   On: 05/23/2020  14:54   VAS [place] LOWER EXTREMITY VENOUS (DVT)  Result Date: 05/23/2020  Lower Venous DVT Study Indications: Pulmonary embolism, and SOB.  Comparison Study: No previous exams Performing Technologist: Ernestene Mention  Examination Guidelines: A complete evaluation includes B-mode imaging, spectral Doppler, color Doppler, and power Doppler as needed of all accessible portions of each vessel. Bilateral testing is considered an integral part of a complete examination. Limited examinations for reoccurring indications may be performed as noted. The reflux portion of the exam is performed with the patient in reverse Trendelenburg.  +---------+---------------+---------+-----------+----------+--------------+ RIGHT    CompressibilityPhasicitySpontaneityPropertiesThrombus Aging +---------+---------------+---------+-----------+----------+--------------+ CFV      Full           Yes      Yes                                 +---------+---------------+---------+-----------+----------+--------------+ SFJ      Full                                                        +---------+---------------+---------+-----------+----------+--------------+ FV Prox  Full           Yes      Yes                                 +---------+---------------+---------+-----------+----------+--------------+ FV Mid   Full           Yes      Yes                                 +---------+---------------+---------+-----------+----------+--------------+ FV DistalFull           Yes      Yes                                 +---------+---------------+---------+-----------+----------+--------------+ PFV      Full                                                        +---------+---------------+---------+-----------+----------+--------------+ POP      Full           Yes      Yes                                 +---------+---------------+---------+-----------+----------+--------------+ PTV      Full                                                         +---------+---------------+---------+-----------+----------+--------------+  PERO     Full                                                        +---------+---------------+---------+-----------+----------+--------------+   +---------+---------------+---------+-----------+----------+--------------+ LEFT     CompressibilityPhasicitySpontaneityPropertiesThrombus Aging +---------+---------------+---------+-----------+----------+--------------+ CFV      Full           Yes      Yes                                 +---------+---------------+---------+-----------+----------+--------------+ SFJ      Full                                                        +---------+---------------+---------+-----------+----------+--------------+ FV Prox  Full           Yes      Yes                                 +---------+---------------+---------+-----------+----------+--------------+ FV Mid   Full           Yes      Yes                                 +---------+---------------+---------+-----------+----------+--------------+ FV DistalNone           No       No                   Acute          +---------+---------------+---------+-----------+----------+--------------+ PFV      Full                                                        +---------+---------------+---------+-----------+----------+--------------+ POP      None           No       No                   Acute          +---------+---------------+---------+-----------+----------+--------------+ PTV      Full                                                        +---------+---------------+---------+-----------+----------+--------------+ PERO     Full                                                        +---------+---------------+---------+-----------+----------+--------------+     Summary: BILATERAL: -No  evidence of popliteal cyst, bilaterally. RIGHT: -  There is no evidence of deep vein thrombosis in the lower extremity. - There is no evidence of superficial venous thrombosis.  LEFT: - Findings consistent with acute deep vein thrombosis involving the left popliteal vein, and left femoral vein. - There is no evidence of superficial venous thrombosis.  *See table(s) above for measurements and observations.    Preliminary    ECHOCARDIOGRAM LIMITED  Result Date: 05/23/2020    ECHOCARDIOGRAM REPORT   Patient Name:   RAYBON CONARD [name] Date of Exam: 05/23/2020 Medical Rec #:  [id number]          Height:       75.0 in Accession #:    [id number]         Weight:       230.0 lb Date of Birth:  09-Aug-2000          BSA:          2.329 m Patient Age:    20 years           BP:           114/68 mmHg Patient Gender: M                  HR:           127 bpm. Exam Location:  Inpatient Procedure: Limited Echo, Color Doppler and Cardiac Doppler STAT ECHO Indications:    I26.02 Pulmonary embolus  History:        Patient has no prior history of Echocardiogram examinations.                 Risk Factors:COVID+ 10/2019.  Sonographer:    Irving Burton Senior RDCS Referring Phys: [id number] Elenore Paddy REESE  Sonographer Comments: Suspected COVID at time of study, limited to assess RV strain and check for thrombus in setting of PE. Myrla Halsted MD present for exam. IMPRESSIONS  1. Left ventricular ejection fraction, by estimation, is 30 to 35%. The left ventricle has moderately decreased function. The left ventricle demonstrates global hypokinesis. Left ventricular diastolic function could not be evaluated.  2. Right ventricular systolic function is moderately reduced. The right ventricular size is moderately enlarged. There is moderately elevated pulmonary artery systolic pressure. The estimated right ventricular systolic pressure is 46.2 mmHg.  3. Right atrial size was moderately dilated.  4. The mitral valve is normal in structure. No evidence of mitral valve regurgitation. No evidence of mitral  stenosis.  5. The TV leaflets are mildly thickened with no evidence of vegetation. . The tricuspid valve is abnormal. Tricuspid valve regurgitation is moderate.  6. The aortic valve is normal in structure. Aortic valve regurgitation is not visualized. No aortic stenosis is present.  7. The inferior vena cava is normal in size with <50% respiratory variability, suggesting right atrial pressure of 8 mmHg.  8. There is a large pericardial effusion mainly at the apex of the LV and RV. Apically there appears to be mild RV diastolic collapse. The RA is mild to moderately dilated and there is no IVC respiratory variability. FINDINGS  Left Ventricle: Left ventricular ejection fraction, by estimation, is 30 to 35%. The left ventricle has moderately decreased function. The left ventricle demonstrates global hypokinesis. The left ventricular internal cavity size was normal in size. There is no left ventricular hypertrophy. Left ventricular diastolic function could not be evaluated. Right Ventricle: The right ventricular size is moderately enlarged. No increase in right ventricular wall thickness. Right  ventricular systolic function is moderately reduced. There is moderately elevated pulmonary artery systolic pressure. The tricuspid  regurgitant velocity is 3.09 m/s, and with an assumed right atrial pressure of 8 mmHg, the estimated right ventricular systolic pressure is 46.2 mmHg. Left Atrium: Left atrial size was normal in size. Right Atrium: Right atrial size was moderately dilated. Pericardium: There is a large pericardial effusion mainly at the apex of the LV and RV. Apically there appears to be mild RV diastolic collapse. The RA is mild to moderately dilated and there is no IVC respiratory variability. A large pericardial effusion is present. The pericardial effusion is circumferential and surrounding the apex. There is diastolic collapse of the right ventricular free wall. Mitral Valve: The mitral valve is normal in  structure. No evidence of mitral valve regurgitation. No evidence of mitral valve stenosis. Tricuspid Valve: The TV leaflets are mildly thickened with no evidence of vegetation. The tricuspid valve is abnormal. Tricuspid valve regurgitation is moderate . No evidence of tricuspid stenosis. Aortic Valve: The aortic valve is normal in structure. Aortic valve regurgitation is not visualized. No aortic stenosis is present. Pulmonic Valve: The pulmonic valve was normal in structure. Pulmonic valve regurgitation is not visualized. No evidence of pulmonic stenosis. Aorta: The aortic root is normal in size and structure. Venous: The inferior vena cava is normal in size with less than 50% respiratory variability, suggesting right atrial pressure of 8 mmHg. IAS/Shunts: No atrial level shunt detected by color flow Doppler.  LEFT VENTRICLE PLAX 2D LVOT diam:     2.10 cm LV SV:         29 LV SV Index:   12 LVOT Area:     3.46 cm  LV Volumes (MOD) LV vol d, MOD A2C: 111.0 ml LV vol d, MOD A4C: 96.4 ml LV vol s, MOD A2C: 78.4 ml LV vol s, MOD A4C: 64.9 ml LV SV MOD A2C:     32.6 ml LV SV MOD A4C:     96.4 ml LV SV MOD BP:      30.5 ml AORTIC VALVE LVOT Vmax:   53.50 cm/s LVOT Vmean:  37.200 cm/s LVOT VTI:    0.083 m TRICUSPID VALVE TR Peak grad:   38.2 mmHg TR Vmax:        309.00 cm/s  SHUNTS Systemic VTI:  0.08 m Systemic Diam: 2.10 cm Armanda Magic MD Electronically signed by Armanda Magic MD Signature Date/Time: 05/23/2020/4:36:52 PM    Final     Cardiac Studies   Echo 05/23/20:   1. Left ventricular ejection fraction, by estimation, is 30 to 35%. The  left ventricle has moderately decreased function. The left ventricle  demonstrates global hypokinesis. Left ventricular diastolic function could  not be evaluated.  2. Right ventricular systolic function is moderately reduced. The right  ventricular size is moderately enlarged. There is moderately elevated  pulmonary artery systolic pressure. The estimated right  ventricular  systolic pressure is 46.2 mmHg.  3. Right atrial size was moderately dilated.  4. The mitral valve is normal in structure. No evidence of mitral valve  regurgitation. No evidence of mitral stenosis.  5. The TV leaflets are mildly thickened with no evidence of vegetation. .  The tricuspid valve is abnormal. Tricuspid valve regurgitation is  moderate.  6. The aortic valve is normal in structure. Aortic valve regurgitation is  not visualized. No aortic stenosis is present.  7. The inferior vena cava is normal in size with <50% respiratory  variability, suggesting  right atrial pressure of 8 mmHg.  8. There is a large pericardial effusion mainly at the apex of the LV and  RV. Apically there appears to be mild RV diastolic collapse. The RA is  mild to moderately dilated and there is no IVC respiratory variability.   Patient Profile     20 y.o. male with Crohn's disease, asthma, and migraines admitted with pericardial effusion and pulmonary emboli after recent Covid-19 infection.  Assessment & Plan    #Submassive pulmonary mbolism: #Right heart strain: #Pericardial fusion: Complicated situation.  Yesterday he was very critically ill and tachycardic to the 140s.  His heart rate has improved with IV heparin, suggesting that there is not extension or hemorrhagic conversion of his pericardial effusion with tamponade.  He is lying flat in the bed and was able to rest more comfortably than he has in a while.  Continue with IV heparin and bedrest today.  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  We will keep him in the ICU and continue to monitor closely for any signs of hemodynamic compromise.  Thus far he seems to be tolerating treatment well.  Recommend repeating his echocardiogram prior to discharge to ensure there is no change in the size of his pericardial effusion or evidence of progression towards  tamponade.   Critically ill patient with complex medical decision making.   CT and echo images re-evaluated personally.  Time spent: 40 minutes-Greater than 50% of this time was spent in counseling, explanation of diagnosis, planning of further management, and coordination of care.   For questions or updates, please contact [facility] Please consult [email or web address] for contact info under        Signed, Chilton Si, MD  05/24/2020, 9:59 AM

## 2020-05-24 NOTE — Progress Notes (Signed)
   05/24/20 1115  Clinical Encounter Type  Visited With Other (Comment) (Spoke with nurse Shanda Bumps)  Visit Type Other (Comment) (Advanced Directive education)  Referral From Nurse  Consult/Referral To Chaplain  Chaplain responded. Nurse Shanda Bumps paged stating the patient's family has filled out Advanced Directive and requested notary. Chaplain explained notary is not available until tomorrow; however, if they have another hospital notary Chaplain will be available. This note was prepared by Deneen Harts, M.Div..  For questions please contact by phone [phone].

## 2020-05-25 ENCOUNTER — Encounter (HOSPITAL_COMMUNITY): Payer: Self-pay | Admitting: Pulmonary Disease

## 2020-05-25 ENCOUNTER — Inpatient Hospital Stay: Payer: Self-pay

## 2020-05-25 ENCOUNTER — Inpatient Hospital Stay (HOSPITAL_COMMUNITY): Payer: BC Managed Care – PPO

## 2020-05-25 DIAGNOSIS — I2602 Saddle embolus of pulmonary artery with acute cor pulmonale: Secondary | ICD-10-CM | POA: Diagnosis not present

## 2020-05-25 DIAGNOSIS — I824Y2 Acute embolism and thrombosis of unspecified deep veins of left proximal lower extremity: Secondary | ICD-10-CM

## 2020-05-25 DIAGNOSIS — I313 Pericardial effusion (noninflammatory): Secondary | ICD-10-CM | POA: Diagnosis not present

## 2020-05-25 DIAGNOSIS — I5082 Biventricular heart failure: Secondary | ICD-10-CM | POA: Diagnosis not present

## 2020-05-25 DIAGNOSIS — I5021 Acute systolic (congestive) heart failure: Secondary | ICD-10-CM | POA: Diagnosis not present

## 2020-05-25 LAB — ECHOCARDIOGRAM LIMITED
Calc EF: 30.2 %
Height: 75 in
S' Lateral: 4.2 cm
Single Plane A2C EF: 36.9 %
Single Plane A4C EF: 23.3 %
Weight: 2938.29 oz

## 2020-05-25 LAB — BASIC METABOLIC PANEL
Anion gap: 8 (ref 5–15)
BUN: 10 mg/dL (ref 6–20)
CO2: 22 mmol/L (ref 22–32)
Calcium: 7 mg/dL — ABNORMAL LOW (ref 8.9–10.3)
Chloride: 103 mmol/L (ref 98–111)
Creatinine, Ser: 1.13 mg/dL (ref 0.61–1.24)
GFR, Estimated: >60 mL/min (ref >60)
Glucose, Bld: 92 mg/dL (ref 70–99)
Potassium: 4.1 mmol/L (ref 3.5–5.1)
Sodium: 133 mmol/L — ABNORMAL LOW (ref 135–145)

## 2020-05-25 LAB — CBC
HCT: 28.8 % — ABNORMAL LOW (ref 39.0–52.0)
Hemoglobin: 9 g/dL — ABNORMAL LOW (ref 13.0–17.0)
MCH: 21.2 pg — ABNORMAL LOW (ref 26.0–34.0)
MCHC: 31.3 g/dL (ref 30.0–36.0)
MCV: 67.9 fL — ABNORMAL LOW (ref 80.0–100.0)
Platelets: 331 10*3/uL (ref 150–400)
RBC: 4.24 MIL/uL (ref 4.22–5.81)
RDW: 18.9 % — ABNORMAL HIGH (ref 11.5–15.5)
WBC: 4.9 10*3/uL (ref 4.0–10.5)
nRBC: 0 % (ref 0.0–0.2)

## 2020-05-25 LAB — COOXEMETRY PANEL
Carboxyhemoglobin: 0 % — ABNORMAL LOW (ref 0.5–1.5)
Methemoglobin: 2.4 % — ABNORMAL HIGH (ref 0.0–1.5)
O2 Saturation: 55 %
Total hemoglobin: 9.4 g/dL — ABNORMAL LOW (ref 12.0–16.0)

## 2020-05-25 LAB — HEPARIN LEVEL (UNFRACTIONATED)
Heparin Unfractionated: 0.3 IU/mL (ref 0.30–0.70)
Heparin Unfractionated: 0.38 IU/mL (ref 0.30–0.70)

## 2020-05-25 MED ORDER — SODIUM CHLORIDE 0.9% FLUSH: 10.0000 mL | INTRAVENOUS | Status: DC | PRN

## 2020-05-25 MED ORDER — POTASSIUM CHLORIDE CRYS ER 10 MEQ PO TBCR
40.0000 meq | EXTENDED_RELEASE_TABLET | Freq: Once | ORAL | Status: AC
  Administered 2020-05-25: 40 meq via ORAL
  Filled 2020-05-25: qty 4

## 2020-05-25 MED ORDER — POTASSIUM CHLORIDE CRYS ER 20 MEQ PO TBCR
40.0000 meq | EXTENDED_RELEASE_TABLET | Freq: Once | ORAL | Status: DC
  Filled 2020-05-25 (×2): qty 2

## 2020-05-25 MED ORDER — SODIUM CHLORIDE 0.9% FLUSH
10.0000 mL | Freq: Two times a day (BID) | INTRAVENOUS | Status: DC
  Administered 2020-05-25 – 2020-05-27 (×4): 10 mL
  Administered 2020-05-27: 20 mL
  Administered 2020-05-28 – 2020-05-29 (×3): 10 mL

## 2020-05-25 MED ORDER — FUROSEMIDE 10 MG/ML IJ SOLN
40.0000 mg | Freq: Two times a day (BID) | INTRAMUSCULAR | Status: AC
  Administered 2020-05-25 – 2020-05-26 (×2): 40 mg via INTRAVENOUS
  Filled 2020-05-25 (×2): qty 4

## 2020-05-25 NOTE — Progress Notes (Signed)
ANTICOAGULATION CONSULT NOTE   Pharmacy Consult for Heparin Indication: Submassive pulmonary embolus  Labs: Recent Labs    05/23/20 1017 05/23/20 1458 05/23/20 2211 05/23/20 2211 05/24/20 0442 05/24/20 1232 05/24/20 2007 05/25/20 0034 05/25/20 1114  HGB 10.7*  --   --   --  9.7*  --   --  9.0*  --   HCT 33.4*  --   --   --  31.0*  --   --  28.8*  --   PLT 405*  --   --   --  363  --   --  331  --   HEPARINUNFRC  --   --  0.15*   < > 0.20*   < > 0.37 0.30 0.38  CREATININE 1.20  --   --   --  1.16  --   --  1.13  --   TROPONINIHS  --  11 12  --   --   --   --   --   --    < > = values in this interval not displayed.   Assessment: 20 yo M presents with submassive PE with R heart strain (RV/LV ratio 1.7). He is note with a pericardial effusion (risk for hemorrhagic transformation  -heparin level at goal on 2450 units/hr -hg= 9 (trend down)   Goal of Therapy:  Heparin level 0.3-0.7 units/ml Monitor platelets by anticoagulation protocol: Yes   Plan:  Continue heparin infusion  2450 units/hr Monitor daily HL, CBC, s/sx bleeding  Harland German, PharmD Clinical Pharmacist **Pharmacist phone directory can now be found on [email or web address] ([email or web address]).  Listed under [facility].

## 2020-05-25 NOTE — Consult Note (Addendum)
Advanced Heart Failure Team Consult Note   Primary Physician: Ihor Gully, MD PCP-Cardiologist:  New    Reason for Consultation: Acute Biventricular Heart Failure/ RV Strain in Setting of Acute Bilateral PE   HPI:    Glenn [name] is seen today for evaluation of acute Biventricular Heart Failure/ RV strain in the setting of acute bilateral PE at the request of Dr. Duke Salvia, Cardiology.   20 y/o male w/ Crohn's disease, asthma, migraine headaches and COVID infection 10/2019, who presented to ED 2/23 w/ complaints of SOB, cough and dizziness and found to have extensive acute bilateral PE w/ evidence of RV strain as well as moderate sized pericardial effusion. LE venous dopplers + for Lt sided DVT. 2D echo showed BiV dysfunction. LVEF 30-35% w/ global hypokensis. RV moderately enlarged w/ moderately reduced systolic function. Moderate TR. No prior studies for comparison.  Cardiology initially consulted. Given hemodynamic stability, there was no immediate indication for TPA nor pericardiocentesis. He was placed on IV heparin.   AHF team now asked to see given increasing volume overload and persistent tachycardia and hypotension. HR on admission was in the 140s but had improved down to the 110s. HR higher today in the 120s. Had f/u limited echo today and pericardial effusion seems slightly smaller. PICC line has been ordered for co-ox measurement.    He is AF. WBC 4.9. Hgb 9.0 (10.7 on admit). Na 133. K 4.1, SCr 1.13  Overall feels much better. Remains hemodynamically stable. BP 102/65. Sinus tach 110s-120. Dyspnea resolved. O2 sats 98% on RA. Denies CP/ pressure. Was able to lay flat last PM w/o orthopnea/ PND.  No leg pain. Denies recent LE trauma. No recent prolonged travel or immobility. No prior personal nor family h/o blood clots. Use to Vape but quit 2 months ago.    Echo 05/23/20 1. Left ventricular ejection fraction, by estimation, is 30 to 35%. The left ventricle  has moderately decreased function. The left ventricle demonstrates global hypokinesis. Left ventricular diastolic function could not be evaluated. 2. Right ventricular systolic function is moderately reduced. The right ventricular size is moderately enlarged. There is moderately elevated pulmonary artery systolic pressure. The estimated right ventricular systolic pressure is 46.2 mmHg. 3. Right atrial size was moderately dilated. 4. The mitral valve is normal in structure. No evidence of mitral valve regurgitation. No evidence of mitral stenosis. 5. The TV leaflets are mildly thickened with no evidence of vegetation. . The tricuspid valve is abnormal. Tricuspid valve regurgitation is moderate. 6. The aortic valve is normal in structure. Aortic valve regurgitation is not visualized. No aortic stenosis is present. 7. The inferior vena cava is normal in size with <50% respiratory variability, suggesting right atrial pressure of 8 mmHg. 8. There is a large pericardial effusion mainly at the apex of the LV and RV. Apically there appears to be mild RV diastolic collapse. The RA is mild to moderately dilated and there is no IVC respiratory variability.  Review of Systems: [y] = yes,  = no   . General: Weight gain ; Weight loss ; Anorexia ; Fatigue ; Fever ; Chills ; Weakness   . Cardiac: Chest pain/pressure ; Resting SOB [Y ]; Exertional SOB [ Y]; Orthopnea ; Pedal Edema ; Palpitations ; Syncope ; Presyncope ; Paroxysmal nocturnal dyspnea[ ]   . Pulmonary: Cough ; Wheezing[ ] ; Hemoptysis[ ] ; Sputum ; Snoring   .  GI: Vomiting[ ] ; Dysphagia[ ] ; Melena[ ] ; Hematochezia ; Heartburn[ ] ; Abdominal pain ; Constipation ; Diarrhea ; BRBPR   . GU: Hematuria[ ] ; Dysuria ; Nocturia[ ]   . Vascular: Pain in legs with walking ; Pain in feet with lying flat ; Non-healing sores ; Stroke ; TIA ; Slurred speech ;  . Neuro:  Headaches[ ] ; Vertigo[ ] ; Seizures[ ] ; Paresthesias[ ] ;Blurred vision ; Diplopia ; Vision changes   . Ortho/Skin: Arthritis ; Joint pain ; Muscle pain ; Joint swelling ; Back Pain ; Rash   . Psych: Depression[ ] ; Anxiety[ ]   . Heme: Bleeding problems ; Clotting disorders ; Anemia   . Endocrine: Diabetes ; Thyroid dysfunction[ ]   Home Medications Prior to Admission medications   Medication Sig Start Date End Date Taking? Authorizing Provider  mesalamine (APRISO) 0.375 g 24 hr capsule Take 0.375 g by mouth every morning. 11/29/19  Yes [provider]  acetaminophen (TYLENOL) 325 MG tablet Take 2 tablets (650 mg total) by mouth every 6 (six) hours as needed. Patient not taking: Reported on 05/23/2020 10/16/17   Sherrie George, PA-C  benzonatate (TESSALON) 100 MG capsule Take 1-2 capsules (100-200 mg total) by mouth 3 (three) times daily as needed. Patient not taking: Reported on 05/23/2020 11/20/19   Wallis Bamberg, PA-C  ondansetron (ZOFRAN) 4 MG tablet Take 1 tablet (4 mg total) by mouth every 6 (six) hours. Patient not taking: Reported on 05/23/2020 11/04/19   Moshe Cipro, NP  promethazine-dextromethorphan (PROMETHAZINE-DM) 6.25-15 MG/5ML syrup Take 5 mLs by mouth at bedtime as needed for cough. Patient not taking: Reported on 05/23/2020 11/20/19   Wallis Bamberg, PA-C  SUMAtriptan (IMITREX) 25 MG tablet Take 1 tablet (25 mg total) by mouth every 2 (two) hours as needed for migraine. Max 2 tabs a day or 3 tabs a week. Patient not taking: Reported on 05/23/2020 01/20/13   Keturah Shavers, MD    Past Medical History: Past Medical History:  Diagnosis Date  . Asthma   . Crohn's disease (HCC) 05/23/2020   Diagnosed ~2019  . Deep vein thrombosis (DVT) of left lower extremity (HCC) 05/23/2020   L femoral, L popliteal veins 05/23/2020  . Headache(784.0)   . Pericardial effusion 05/23/2020  . Pulmonary emboli (HCC) 05/23/2020   Bilateral with e/o R heart  strain on CTA    Past Surgical History: Past Surgical History:  Procedure Laterality Date  . CIRCUMCISION      Family History: Family History  Problem Relation Age of Onset  . Seizures Cousin        Epilepsy    Social History: Social History   Socioeconomic History  . Marital status: Single    Spouse name: Not on file  . Number of children: Not on file  . Years of education: Not on file  . Highest education level: Not on file  Occupational History  . Not on file  Tobacco Use  . Smoking status: Current Some Day Smoker    Types: E-cigarettes  . Smokeless tobacco: Never Used  Vaping Use  . Vaping Use: Some days  Substance and Sexual Activity  . Alcohol use: No  . Drug use: Never  . Sexual activity: Not on file  Other Topics Concern  . Not on file  Social History Narrative  . Not on file   Social Determinants of Health  Financial Resource Strain: Not on file  Food Insecurity: Not on file  Transportation Needs: Not on file  Physical Activity: Not on file  Stress: Not on file  Social Connections: Not on file    Allergies:  Allergies  Allergen Reactions  . Penicillins Hives  . Cefdinir Hives  . Other     Seasonal, Pet Dander, Trees, Mold    Objective:    Vital Signs:   Temp:  [98.5 F (36.9 C)-99.6 F (37.6 C)] 99.2 F (37.3 C) (02/25 0700) Pulse Rate:  [106-242] 117 (02/25 0700) Resp:  [0-31] 28 (02/25 0700) BP: (86-111)/(58-82) 92/63 (02/25 0700) SpO2:  [95 %-100 %] 95 % (02/25 0700) Weight:  [83.3 kg] 83.3 kg (02/25 0338) Last BM Date: 05/24/20  Weight change: Filed Weights   05/23/20 1500 05/24/20 0353 05/25/20 0338  Weight: 104.3 kg 78.5 kg 83.3 kg    Intake/Output:   Intake/Output Summary (Last 24 hours) at 05/25/2020 0940 Last data filed at 05/25/2020 0800 Gross per 24 hour  Intake 2872.06 ml  Output 2400 ml  Net 472.06 ml      Physical Exam    General:  Well appearing young male No resp difficulty HEENT: normal Neck:  supple. JVP ~9 cm . Carotids 2+ bilat; no bruits. No lymphadenopathy or thyromegaly appreciated. Cor: PMI nondisplaced. Regular rhythm, tachy rate. + gallop  Lungs: clear Abdomen: soft, nontender, nondistended. No hepatosplenomegaly. No bruits or masses. Good bowel sounds. Extremities: no cyanosis, clubbing, rash, Lt LE swollen and warm  Neuro: alert & orientedx3, cranial nerves grossly intact. moves all 4 extremities w/o difficulty. Affect pleasant   Telemetry   Sinus tach 120 bpm   EKG    Admit EKG, sinus tach 140 bpm   Labs   Basic Metabolic Panel: Recent Labs  Lab 05/23/20 1017 05/24/20 0442 05/25/20 0034  NA 133* 134* 133*  K 4.2 4.2 4.1  CL 100 101 103  CO2 23 25 22   GLUCOSE 110* 102* 92  BUN 10 10 10   CREATININE 1.20 1.16 1.13  CALCIUM 7.8* 7.6* 7.0*  MG  --  1.7  --   PHOS  --  3.9  --     Liver Function Tests: Recent Labs  Lab 05/23/20 1017  AST 11*  ALT 11  ALKPHOS 101  BILITOT 0.4  PROT 6.1*  ALBUMIN 1.6*   Recent Labs  Lab 05/23/20 1017  LIPASE 18   No results for input(s): AMMONIA in the last 168 hours.  CBC: Recent Labs  Lab 05/23/20 1017 05/24/20 0442 05/25/20 0034  WBC 7.5 6.4 4.9  HGB 10.7* 9.7* 9.0*  HCT 33.4* 31.0* 28.8*  MCV 68.4* 68.0* 67.9*  PLT 405* 363 331    Cardiac Enzymes: No results for input(s): CKTOTAL, CKMB, CKMBINDEX, TROPONINI in the last 168 hours.  BNP: BNP (last 3 results) Recent Labs    05/23/20 1307  BNP 355.4*    ProBNP (last 3 results) No results for input(s): PROBNP in the last 8760 hours.   CBG: No results for input(s): GLUCAP in the last 168 hours.  Coagulation Studies: No results for input(s): LABPROT, INR in the last 72 hours.   Imaging   [place] EKG SITE RITE  Result Date: 05/25/2020 If [facility] image not attached, placement could not be confirmed due to current cardiac rhythm.     Medications:     Current Medications: . Chlorhexidine Gluconate Cloth  6 each Topical Daily   . influenza vac split quadrivalent PF  0.5  mL Intramuscular Tomorrow-1000  . Mesalamine  400 mg Oral Daily  . pantoprazole  40 mg Oral Daily     Infusions: . heparin 2,450 Units/hr (05/25/20 0814)  . lactated ringers 100 mL/hr at 05/25/20 4818      Assessment/Plan   1. Acute Bilateral PE + Lt LE DVT - unprovoked  - CT evidence of RV strain. RV moderately dilated w/ moderately reduced systolic function by echo  - No TPA given moderate sized pericardial effusion and risk for hemorrhagic transformation and fact that he has remained relatively hemodynamically stable  - on IV heparin w/ interval improvement of symptoms. Dyspnea resolved and O2 sats 98% on RA    2. Acute Biventricular Heart Failure  - 2D echo w/ LVEF 30-35%. RV moderately enlarged w/ moderately reduced systolic function. No prior study for comparison. Suspect RV dysfunction is 2/2 strain from bilateral PE. ? If LV dysfunction is stress related vs ? Component of viral CM (h/o COVID infection 8/21). Doubt ischemic heart disease given young age and no risk factors.  - Agree w/ placement of PICC to check Co-ox    - GDMT currently limited by soft BP  - Set up CVP monitoring to guide diuresis  - Add digoxin 0.125 for BiV dysfunction and HR control  - if BP stabilizes, may be able to tolerate low dose spiro 12.5 qhs - will need cMRI   3. Pericardial Effusion  - Moderate sized effusion on initial TTE - repeat limited echo pending to assesses for interval change in size of effusion   4. Tricuspid Regurgitation  - Moderate, likely functional from RV dilation   5. Crohn's Disease  - on Mesalamine  6. H/o COVID 19 Infection  - Diagnosed 8/21 - only symptoms was loss of taste   Length of Stay: 2  Robbie Lis, PA-C  05/25/2020, 9:40 AM  Advanced Heart Failure Team Pager [phone] (M-F; 7a - 4p)  Please contact [facility] for night-coverage after hours (4p -7a ) and weekends on [email or web address]  Patient seen with  PA, agree with the above note.   He has a history of Crohns Disease on mesalamine for about 2 years.  He had COVID-19 in 8/21, was not particularly ill and had full recovery.    He had severe dyspnea for about 3-4 days prior to admission.  Also reports that his Crohns disease seemed to be flaring for about 1 week as well.  He came to ER, was found to have submassive extensive bilateral PEs.  He was started on heparin gtt.   Echo showed severe RV dysfunction/moderate dilation.  LV EF was also low at 30% (appeared worse at base better at apex).  Moderate pericardial effusion without tamponade.    Patient did not get thrombolysis because of concern for pericardial effusion and risk of hemorrhagic conversion.   Today, HR still 110s-120s with SBP 100s.  He feels much better today, now on room air. No dyspnea or chest pain at rest. PICC placed, CVP 11-12.   General: NAD Neck: JVP 8-9 cm, no thyromegaly or thyroid nodule.  Lungs: Clear to auscultation bilaterally with normal respiratory effort. CV: Nondisplaced PMI.  Heart tachy, regular S1/S2, right-sided S3, no murmur.  1+ ankle edema.  No carotid bruit.  Normal pedal pulses.  Abdomen: Soft, nontender, no hepatosplenomegaly, no distention.  Skin: Intact without lesions or rashes.  Neurologic: Alert and oriented x 3.  Psych: Normal affect. Extremities: No clubbing or cyanosis.  HEENT: Normal.   1. Acute  submassive PE: Bilateral PEs on CTA, possible component of pulmonary infarction.  DVT in left leg.  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  Has had recent Crohns flare, ?related to inflammation from Crohns.   - Continue heparin gtt, eventually transition to DOAC.  - He is doing better symptomatically today and is on room air.  Would hold off on thrombolysis given clinical improvement and moderate pericardial effusion with some risk for hemorrhagic conversion.  2.  Pericardial effusion: Moderate on today's echo without tamponade.  Cause uncertain, ?related to inflammation from Crohns, ?due to large PE.   - Monitor for now, repeat echo on Sunday or Monday to make sure not progressing.  - Gentle diuresis.  3. Acute on chronic biventricular failure: I reviewed today's echo.  He has severe RV dysfunction with McConnell's sign and moderate RV enlargement, this is from the large PE.  However, he also has decreased LV systolic function, EF appears to be around 30%.  ?Pre-existing cardiomyopathy, perhaps viral myocarditis-related from prior COVID-19 infection.  However, the pattern of wall motion abnormality with relatively preserved apical function and more hypokinetic towards the base raises concern for stress cardiomyopathy variant (reverse Takotsubo). He is volume overloaded with puffy extremities and CVP 11-12.  - Gentle Lasix, will give 40 mg IV bid today and follow response.  - He will need a cardiac MRI to evaluate cardiomyopathy when he can lie flat.  4. Crohns disease: Recent flare symptomatically.  He is on mesalamine.   Marca Ancona 05/25/2020 12:18 PM

## 2020-05-25 NOTE — Progress Notes (Signed)
Peripherally Inserted Central Catheter Placement  The IV Nurse has discussed with the patient and/or persons authorized to consent for the patient, the purpose of this procedure and the potential benefits and risks involved with this procedure.  The benefits include less needle sticks, lab draws from the catheter, and the patient may be discharged home with the catheter. Risks include, but not limited to, infection, bleeding, blood clot (thrombus formation), and puncture of an artery; nerve damage and irregular heartbeat and possibility to perform a PICC exchange if needed/ordered by physician.  Alternatives to this procedure were also discussed.  Bard Power PICC patient education guide, fact sheet on infection prevention and patient information card has been provided to patient /or left at bedside.    PICC Placement Documentation  PICC Double Lumen 05/25/20 PICC Right Basilic 39 cm 0 cm (Active)  Indication for Insertion or Continuance of Line Limited venous access - need for IV therapy >5 days (PICC only) 05/25/20 1000  Exposed Catheter (cm) 0 cm 05/25/20 1000  Site Assessment Clean;Dry;Intact 05/25/20 1000  Lumen #1 Status Flushed;Saline locked;Blood return noted 05/25/20 1000  Lumen #2 Status Flushed;Saline locked;Blood return noted 05/25/20 1000  Dressing Type Transparent;Securing device 05/25/20 1000  Dressing Status Clean;Dry;Intact 05/25/20 1000  Antimicrobial disc in place? Yes 05/25/20 1000  Safety Lock Not Applicable 05/25/20 1000  Line Care Connections checked and tightened 05/25/20 1000  Line Adjustment (NICU/IV Team Only) No 05/25/20 1000  Dressing Intervention New dressing 05/25/20 1000  Dressing Change Due 06/01/20 05/25/20 1000       Franne Grip Renee 05/25/2020, 10:58 AM

## 2020-05-25 NOTE — Progress Notes (Signed)
°  Echocardiogram 2D Echocardiogram has been performed.  Janalyn Harder 05/25/2020, 8:33 AM

## 2020-05-25 NOTE — Progress Notes (Signed)
Progress Note  Patient Name: Glenn [name] Date of Encounter: 05/25/2020  [facility] Cardiologist: No primary care provider on file.   Subjective   Breathing is improving.  He was able to get good sleep and slept laying flat.    Inpatient Medications    Scheduled Meds: . Chlorhexidine Gluconate Cloth  6 each Topical Daily  . influenza vac split quadrivalent PF  0.5 mL Intramuscular Tomorrow-1000  . Mesalamine  400 mg Oral Daily  . pantoprazole  40 mg Oral Daily   Continuous Infusions: . heparin 2,450 Units/hr (05/25/20 2841)  . lactated ringers 100 mL/hr at 05/25/20 0635   PRN Meds:    Vital Signs    Vitals:   05/25/20 0400 05/25/20 0500 05/25/20 0600 05/25/20 0700  BP: 97/66 (!) 86/60 102/65 92/63  Pulse: (!) 120 (!) 123 (!) 121 (!) 117  Resp: (!) 31 19 (!) 25 (!) 28  Temp:    99.2 F (37.3 C)  TempSrc:      SpO2: 97% 98% 98% 95%  Weight:      Height:        Intake/Output Summary (Last 24 hours) at 05/25/2020 0913 Last data filed at 05/25/2020 3244 Gross per 24 hour  Intake 2872.06 ml  Output 1500 ml  Net 1372.06 ml   Last 3 Weights 05/25/2020 05/24/2020 05/23/2020  Weight (lbs) 183 lb 10.3 oz 173 lb 1 oz 230 lb  Weight (kg) 83.3 kg 78.5 kg 104.327 kg      Telemetry    Sinus tachycardia.  Rates improved from 140s to 110s.  - Personally Reviewed  ECG    Sinus tachycardia.  Rate 140 bpm. - Personally Reviewed  Physical Exam   VS:  BP 92/63   Pulse (!) 117   Temp 99.2 F (37.3 C)   Resp (!) 28   Ht 6\' 3"  (1.905 m)   Wt 83.3 kg   SpO2 95%   BMI 22.95 kg/m  , BMI Body mass index is 22.95 kg/m. GENERAL:  Well appearing HEENT: Pupils equal round and reactive, fundi not visualized, oral mucosa unremarkable NECK:  No jugular venous distention, waveform within normal limits, carotid upstroke brisk and symmetric, no bruits LUNGS:  Clear to auscultation bilaterally HEART:  Tachycardic.  Regular rhtyhm.  PMI not displaced or sustained,S1 and S2  within normal limits, + S3, no S4, no clicks, no rubs, no murmurs ABD:  Flat, positive bowel sounds normal in frequency in pitch, no bruits, no rebound, no guarding, no midline pulsatile mass, no hepatomegaly, no splenomegaly EXT:  2 plus pulses throughout, 1+ LE edema bilaterally, no cyanosis no clubbing SKIN:  No rashes no nodules NEURO:  Cranial nerves II through XII grossly intact, motor grossly intact throughout [facility]:  Cognitively intact, oriented to person place and time   Labs    High Sensitivity Troponin:   Recent Labs  Lab 05/23/20 1458 05/23/20 2211  TROPONINIHS 11 12      Chemistry Recent Labs  Lab 05/23/20 1017 05/24/20 0442 05/25/20 0034  NA 133* 134* 133*  K 4.2 4.2 4.1  CL 100 101 103  CO2 23 25 22   GLUCOSE 110* 102* 92  BUN 10 10 10   CREATININE 1.20 1.16 1.13  CALCIUM 7.8* 7.6* 7.0*  PROT 6.1*  --   --   ALBUMIN 1.6*  --   --   AST 11*  --   --   ALT 11  --   --   ALKPHOS 101  --   --  BILITOT 0.4  --   --   GFRNONAA >60 >60 >60  ANIONGAP 10 8 8      Hematology Recent Labs  Lab 05/23/20 1017 05/24/20 0442 05/25/20 0034  WBC 7.5 6.4 4.9  RBC 4.88 4.56 4.24  HGB 10.7* 9.7* 9.0*  HCT 33.4* 31.0* 28.8*  MCV 68.4* 68.0* 67.9*  MCH 21.9* 21.3* 21.2*  MCHC 32.0 31.3 31.3  RDW 19.2* 19.1* 18.9*  PLT 405* 363 331    BNP Recent Labs  Lab 05/23/20 1307  BNP 355.4*     DDimer No results for input(s): DDIMER in the last 168 hours.   Radiology    DG Chest 2 View  Result Date: 05/23/2020 CLINICAL DATA:  Shortness of breath EXAM: CHEST - 2 VIEW COMPARISON:  None. FINDINGS: The lungs are clear. The heart size and pulmonary vascularity are normal. No adenopathy. There is lower thoracic dextroscoliosis. IMPRESSION: Lungs clear.  Cardiac silhouette normal. Electronically Signed   By: 05/25/2020 [name] M.D.   On: 05/23/2020 09:32   CT Angio Chest PE W and/or Wo Contrast  Result Date: 05/23/2020 CLINICAL DATA:  Exertion ule shortness of  breath for the past 3 days. Right lower quadrant abdominal pain. EXAM: CT ANGIOGRAPHY CHEST CT ABDOMEN AND PELVIS WITH CONTRAST TECHNIQUE: Multidetector CT imaging of the chest was performed using the standard protocol during bolus administration of intravenous contrast. Multiplanar CT image reconstructions and MIPs were obtained to evaluate the vascular anatomy. Multidetector CT imaging of the abdomen and pelvis was performed using the standard protocol during bolus administration of intravenous contrast. CONTRAST:  100 mL Omnipaque 350 intravenous contrast. COMPARISON:  Chest x-ray from same day. CT abdomen pelvis dated October 15, 2017. FINDINGS: CTA CHEST FINDINGS Cardiovascular: Satisfactory opacification of the pulmonary arteries to the segmental level. Extensive acute bilateral pulmonary emboli involving the distal right and left pulmonary arteries as well as the lobar and segmental pulmonary arteries of all lobes in both lungs. Thin, tiny embolism at the main pulmonary artery bifurcation (series 5, image 59). Right heart enlargement with elevated RV/LV ratio measuring 1.7. Small pericardial effusion. No thoracic aortic aneurysm. Mediastinum/Nodes: No enlarged mediastinal, hilar, or axillary lymph nodes. Thyroid gland, trachea, and esophagus demonstrate no significant findings. Lungs/Pleura: Small wedge-shaped areas of increased density in the peripheral anterior medial left upper lobe and anterior right lower lobe along the major fissure. No focal consolidation, pleural effusion, or pneumothorax. Musculoskeletal: No chest wall abnormality. No acute or significant osseous findings. Review of the MIP images confirms the above findings. CT ABDOMEN AND PELVIS FINDINGS Hepatobiliary: Diffusely heterogeneous liver parenchyma, likely perfusion related. No focal liver abnormality. The gallbladder is unremarkable. No biliary dilatation. Pancreas: Unremarkable. No pancreatic ductal dilatation or surrounding inflammatory  changes. Spleen: Normal in size without focal abnormality. Adrenals/Urinary Tract: Adrenal glands are unremarkable. Kidneys are normal, without renal calculi, focal lesion, or hydronephrosis. Bladder is unremarkable. Stomach/Bowel: Inflammatory changes of the cecum and appendiceal stump with wall thickening and mucosal enhancement, mildly progressed since the prior study. There is new severe wall thickening and mucosal enhancement of the distal and terminal ileum. The stomach and remaining small bowel are unremarkable. No obstruction. Vascular/Lymphatic: No significant vascular findings. There are multiple mildly enlarged mesenteric lymph nodes measuring up to 1.3 cm in short axis, increased since the prior study. Reproductive: Prostate is unremarkable. Other: Unchanged trace free fluid in the pelvis. No pneumoperitoneum. Musculoskeletal: No acute or significant osseous findings. Review of the MIP images confirms the above findings. IMPRESSION: CHEST: 1.  Extensive acute bilateral pulmonary emboli with CT evidence of right heart strain (RV/LV Ratio 1.7) consistent with at least submassive (intermediate risk) PE. The presence of right heart strain has been associated with an increased risk of morbidity and mortality. 2. Small wedge-shaped areas of increased density in the peripheral anterior medial left upper lobe and anterior right lower lobe along the major fissure could reflect small pulmonary infarcts. 3. Small pericardial effusion. ABDOMEN AND PELVIS: 1. Progressive inflammatory changes of the cecum and appendiceal stump with new severe wall thickening and mucosal enhancement of the distal and terminal ileum, concerning for inflammatory bowel disease. No perforation or drainable fluid collection. 2. Progressive mesenteric lymphadenopathy, likely reactive. Critical Value/emergent results were called by telephone at the time of interpretation on 05/23/2020 at 2:51 pm to provider [facility] , who verbally  acknowledged these results. Electronically Signed   By: Obie Dredge M.D.   On: 05/23/2020 14:54   CT ABDOMEN PELVIS W CONTRAST  Result Date: 05/23/2020 CLINICAL DATA:  Exertion ule shortness of breath for the past 3 days. Right lower quadrant abdominal pain. EXAM: CT ANGIOGRAPHY CHEST CT ABDOMEN AND PELVIS WITH CONTRAST TECHNIQUE: Multidetector CT imaging of the chest was performed using the standard protocol during bolus administration of intravenous contrast. Multiplanar CT image reconstructions and MIPs were obtained to evaluate the vascular anatomy. Multidetector CT imaging of the abdomen and pelvis was performed using the standard protocol during bolus administration of intravenous contrast. CONTRAST:  100 mL Omnipaque 350 intravenous contrast. COMPARISON:  Chest x-ray from same day. CT abdomen pelvis dated October 15, 2017. FINDINGS: CTA CHEST FINDINGS Cardiovascular: Satisfactory opacification of the pulmonary arteries to the segmental level. Extensive acute bilateral pulmonary emboli involving the distal right and left pulmonary arteries as well as the lobar and segmental pulmonary arteries of all lobes in both lungs. Thin, tiny embolism at the main pulmonary artery bifurcation (series 5, image 59). Right heart enlargement with elevated RV/LV ratio measuring 1.7. Small pericardial effusion. No thoracic aortic aneurysm. Mediastinum/Nodes: No enlarged mediastinal, hilar, or axillary lymph nodes. Thyroid gland, trachea, and esophagus demonstrate no significant findings. Lungs/Pleura: Small wedge-shaped areas of increased density in the peripheral anterior medial left upper lobe and anterior right lower lobe along the major fissure. No focal consolidation, pleural effusion, or pneumothorax. Musculoskeletal: No chest wall abnormality. No acute or significant osseous findings. Review of the MIP images confirms the above findings. CT ABDOMEN AND PELVIS FINDINGS Hepatobiliary: Diffusely heterogeneous liver  parenchyma, likely perfusion related. No focal liver abnormality. The gallbladder is unremarkable. No biliary dilatation. Pancreas: Unremarkable. No pancreatic ductal dilatation or surrounding inflammatory changes. Spleen: Normal in size without focal abnormality. Adrenals/Urinary Tract: Adrenal glands are unremarkable. Kidneys are normal, without renal calculi, focal lesion, or hydronephrosis. Bladder is unremarkable. Stomach/Bowel: Inflammatory changes of the cecum and appendiceal stump with wall thickening and mucosal enhancement, mildly progressed since the prior study. There is new severe wall thickening and mucosal enhancement of the distal and terminal ileum. The stomach and remaining small bowel are unremarkable. No obstruction. Vascular/Lymphatic: No significant vascular findings. There are multiple mildly enlarged mesenteric lymph nodes measuring up to 1.3 cm in short axis, increased since the prior study. Reproductive: Prostate is unremarkable. Other: Unchanged trace free fluid in the pelvis. No pneumoperitoneum. Musculoskeletal: No acute or significant osseous findings. Review of the MIP images confirms the above findings. IMPRESSION: CHEST: 1. Extensive acute bilateral pulmonary emboli with CT evidence of right heart strain (RV/LV Ratio 1.7) consistent with at least submassive (intermediate  risk) PE. The presence of right heart strain has been associated with an increased risk of morbidity and mortality. 2. Small wedge-shaped areas of increased density in the peripheral anterior medial left upper lobe and anterior right lower lobe along the major fissure could reflect small pulmonary infarcts. 3. Small pericardial effusion. ABDOMEN AND PELVIS: 1. Progressive inflammatory changes of the cecum and appendiceal stump with new severe wall thickening and mucosal enhancement of the distal and terminal ileum, concerning for inflammatory bowel disease. No perforation or drainable fluid collection. 2. Progressive  mesenteric lymphadenopathy, likely reactive. Critical Value/emergent results were called by telephone at the time of interpretation on 05/23/2020 at 2:51 pm to provider [facility] , who verbally acknowledged these results. Electronically Signed   By: Obie Dredge M.D.   On: 05/23/2020 14:54   VAS [place] LOWER EXTREMITY VENOUS (DVT)  Result Date: 05/24/2020  Lower Venous DVT Study Indications: Pulmonary embolism, and SOB.  Comparison Study: No previous exams Performing Technologist: Ernestene Mention  Examination Guidelines: A complete evaluation includes B-mode imaging, spectral Doppler, color Doppler, and power Doppler as needed of all accessible portions of each vessel. Bilateral testing is considered an integral part of a complete examination. Limited examinations for reoccurring indications may be performed as noted. The reflux portion of the exam is performed with the patient in reverse Trendelenburg.  +---------+---------------+---------+-----------+----------+--------------+ RIGHT    CompressibilityPhasicitySpontaneityPropertiesThrombus Aging +---------+---------------+---------+-----------+----------+--------------+ CFV      Full           Yes      Yes                                 +---------+---------------+---------+-----------+----------+--------------+ SFJ      Full                                                        +---------+---------------+---------+-----------+----------+--------------+ FV Prox  Full           Yes      Yes                                 +---------+---------------+---------+-----------+----------+--------------+ FV Mid   Full           Yes      Yes                                 +---------+---------------+---------+-----------+----------+--------------+ FV DistalFull           Yes      Yes                                 +---------+---------------+---------+-----------+----------+--------------+ PFV      Full                                                         +---------+---------------+---------+-----------+----------+--------------+ POP      Full  Yes      Yes                                 +---------+---------------+---------+-----------+----------+--------------+ PTV      Full                                                        +---------+---------------+---------+-----------+----------+--------------+ PERO     Full                                                        +---------+---------------+---------+-----------+----------+--------------+   +---------+---------------+---------+-----------+----------+--------------+ LEFT     CompressibilityPhasicitySpontaneityPropertiesThrombus Aging +---------+---------------+---------+-----------+----------+--------------+ CFV      Full           Yes      Yes                                 +---------+---------------+---------+-----------+----------+--------------+ SFJ      Full                                                        +---------+---------------+---------+-----------+----------+--------------+ FV Prox  Full           Yes      Yes                                 +---------+---------------+---------+-----------+----------+--------------+ FV Mid   Full           Yes      Yes                                 +---------+---------------+---------+-----------+----------+--------------+ FV DistalNone           No       No                   Acute          +---------+---------------+---------+-----------+----------+--------------+ PFV      Full                                                        +---------+---------------+---------+-----------+----------+--------------+ POP      None           No       No                   Acute          +---------+---------------+---------+-----------+----------+--------------+ PTV      Full                                                         +---------+---------------+---------+-----------+----------+--------------+  PERO     Full                                                        +---------+---------------+---------+-----------+----------+--------------+     Summary: BILATERAL: -No evidence of popliteal cyst, bilaterally. RIGHT: - There is no evidence of deep vein thrombosis in the lower extremity. - There is no evidence of superficial venous thrombosis.  LEFT: - Findings consistent with acute deep vein thrombosis involving the left popliteal vein, and left femoral vein. - There is no evidence of superficial venous thrombosis.  *See table(s) above for measurements and observations. Electronically signed by Heath Lark on 05/24/2020 at 8:10:05 PM.    Final    ECHOCARDIOGRAM LIMITED  Result Date: 05/23/2020    ECHOCARDIOGRAM REPORT   Patient Name:   Cecile Sheerer [name] Date of Exam: 05/23/2020 Medical Rec #:  [id number]          Height:       75.0 in Accession #:    [id number]         Weight:       230.0 lb Date of Birth:  08-06-2000          BSA:          2.329 m Patient Age:    20 years           BP:           114/68 mmHg Patient Gender: M                  HR:           127 bpm. Exam Location:  Inpatient Procedure: Limited Echo, Color Doppler and Cardiac Doppler STAT ECHO Indications:    I26.02 Pulmonary embolus  History:        Patient has no prior history of Echocardiogram examinations.                 Risk Factors:COVID+ 10/2019.  Sonographer:    Irving Burton Senior RDCS Referring Phys: [id number] Elenore Paddy REESE  Sonographer Comments: Suspected COVID at time of study, limited to assess RV strain and check for thrombus in setting of PE. Myrla Halsted MD present for exam. IMPRESSIONS  1. Left ventricular ejection fraction, by estimation, is 30 to 35%. The left ventricle has moderately decreased function. The left ventricle demonstrates global hypokinesis. Left ventricular diastolic function could not be evaluated.  2. Right ventricular systolic  function is moderately reduced. The right ventricular size is moderately enlarged. There is moderately elevated pulmonary artery systolic pressure. The estimated right ventricular systolic pressure is 46.2 mmHg.  3. Right atrial size was moderately dilated.  4. The mitral valve is normal in structure. No evidence of mitral valve regurgitation. No evidence of mitral stenosis.  5. The TV leaflets are mildly thickened with no evidence of vegetation. . The tricuspid valve is abnormal. Tricuspid valve regurgitation is moderate.  6. The aortic valve is normal in structure. Aortic valve regurgitation is not visualized. No aortic stenosis is present.  7. The inferior vena cava is normal in size with <50% respiratory variability, suggesting right atrial pressure of 8 mmHg.  8. There is a large pericardial effusion mainly at the apex of the LV and RV. Apically there appears to be mild RV diastolic collapse. The RA is mild  to moderately dilated and there is no IVC respiratory variability. FINDINGS  Left Ventricle: Left ventricular ejection fraction, by estimation, is 30 to 35%. The left ventricle has moderately decreased function. The left ventricle demonstrates global hypokinesis. The left ventricular internal cavity size was normal in size. There is no left ventricular hypertrophy. Left ventricular diastolic function could not be evaluated. Right Ventricle: The right ventricular size is moderately enlarged. No increase in right ventricular wall thickness. Right ventricular systolic function is moderately reduced. There is moderately elevated pulmonary artery systolic pressure. The tricuspid  regurgitant velocity is 3.09 m/s, and with an assumed right atrial pressure of 8 mmHg, the estimated right ventricular systolic pressure is 46.2 mmHg. Left Atrium: Left atrial size was normal in size. Right Atrium: Right atrial size was moderately dilated. Pericardium: There is a large pericardial effusion mainly at the apex of the LV  and RV. Apically there appears to be mild RV diastolic collapse. The RA is mild to moderately dilated and there is no IVC respiratory variability. A large pericardial effusion is present. The pericardial effusion is circumferential and surrounding the apex. There is diastolic collapse of the right ventricular free wall. Mitral Valve: The mitral valve is normal in structure. No evidence of mitral valve regurgitation. No evidence of mitral valve stenosis. Tricuspid Valve: The TV leaflets are mildly thickened with no evidence of vegetation. The tricuspid valve is abnormal. Tricuspid valve regurgitation is moderate . No evidence of tricuspid stenosis. Aortic Valve: The aortic valve is normal in structure. Aortic valve regurgitation is not visualized. No aortic stenosis is present. Pulmonic Valve: The pulmonic valve was normal in structure. Pulmonic valve regurgitation is not visualized. No evidence of pulmonic stenosis. Aorta: The aortic root is normal in size and structure. Venous: The inferior vena cava is normal in size with less than 50% respiratory variability, suggesting right atrial pressure of 8 mmHg. IAS/Shunts: No atrial level shunt detected by color flow Doppler.  LEFT VENTRICLE PLAX 2D LVOT diam:     2.10 cm LV SV:         29 LV SV Index:   12 LVOT Area:     3.46 cm  LV Volumes (MOD) LV vol d, MOD A2C: 111.0 ml LV vol d, MOD A4C: 96.4 ml LV vol s, MOD A2C: 78.4 ml LV vol s, MOD A4C: 64.9 ml LV SV MOD A2C:     32.6 ml LV SV MOD A4C:     96.4 ml LV SV MOD BP:      30.5 ml AORTIC VALVE LVOT Vmax:   53.50 cm/s LVOT Vmean:  37.200 cm/s LVOT VTI:    0.083 m TRICUSPID VALVE TR Peak grad:   38.2 mmHg TR Vmax:        309.00 cm/s  SHUNTS Systemic VTI:  0.08 m Systemic Diam: 2.10 cm Armanda Magic MD Electronically signed by Armanda Magic MD Signature Date/Time: 05/23/2020/4:36:52 PM    Final    [place] EKG SITE RITE  Result Date: 05/25/2020 If Site Rite image not attached, placement could not be confirmed due to current  cardiac rhythm.   Cardiac Studies   Echo 05/23/20:  1. Left ventricular ejection fraction, by estimation, is 30 to 35%. The  left ventricle has moderately decreased function. The left ventricle  demonstrates global hypokinesis. Left ventricular diastolic function could  not be evaluated.  2. Right ventricular systolic function is moderately reduced. The right  ventricular size is moderately enlarged. There is moderately elevated  pulmonary artery systolic pressure.  The estimated right ventricular  systolic pressure is 46.2 mmHg.  3. Right atrial size was moderately dilated.  4. The mitral valve is normal in structure. No evidence of mitral valve  regurgitation. No evidence of mitral stenosis.  5. The TV leaflets are mildly thickened with no evidence of vegetation. .  The tricuspid valve is abnormal. Tricuspid valve regurgitation is  moderate.  6. The aortic valve is normal in structure. Aortic valve regurgitation is  not visualized. No aortic stenosis is present.  7. The inferior vena cava is normal in size with <50% respiratory  variability, suggesting right atrial pressure of 8 mmHg.  8. There is a large pericardial effusion mainly at the apex of the LV and  RV. Apically there appears to be mild RV diastolic collapse. The RA is  mild to moderately dilated and there is no IVC respiratory variability.   Patient Profile     20 y.o. male with Crohn's disease, asthma, and migraines admitted with pericardial effusion and pulmonary emboli after recent Covid-19 infection.  Assessment & Plan    #Submassive pulmonary mbolism: #Right heart strain: #Pericardial fusion: Complicated situation.  On admission he was tachycardic to the 140s.  Yesterday his heart rates improved to the 110s with IV heparin.  Today he is tachycardic back up to the 120s.  He is feeling better and his breathing has improved.  However he is becoming increasingly volume overloaded.  He was net +3 L.  I  reviewed his echocardiogram.  Overall appears unchanged.  He has a severely dilated right ventricle with severe hypokinesis and akinesis.  LVEF appears still around 30%.  His pericardial effusion seems slightly smaller.  He remains hypotensive and tachycardic.  We will put in a PICC to be able to check CVP/CO-ox.  I have asked our Advanced HF team to see him and will touch base with critical care regarding lytics understanding that there is a risk of hemorraghic pericardial effusion.  If this is deemed too risky would add levophed for RV support while the heparin is working.   Total critical care time: 45 minutes. Critical care time was exclusive of separately billable procedures and treating other patients. Critical care was necessary to treat or prevent imminent or life-threatening deterioration. Critical care was time spent personally by me on the following activities: development of treatment plan with patient and/or surrogate as well as nursing, discussions with consultants, evaluation of patient's response to treatment, examination of patient, obtaining history from patient or surrogate, ordering and performing treatments and interventions, ordering and review of laboratory studies, ordering and review of radiographic studies, pulse oximetry and re-evaluation of patient's condition.   For questions or updates, please contact [facility] Please consult [email or web address] for contact info under        Signed, Chilton Si, MD  05/25/2020, 9:13 AM

## 2020-05-25 NOTE — Progress Notes (Addendum)
NAME:  Glenn [name], MRN:  [id number], DOB:  [birth year], LOS: 2 ADMISSION DATE:  05/23/2020, CONSULTATION DATE:  05/23/2020 REFERRING MD:  EDP, CHIEF COMPLAINT:  SOB   Brief History:  20 year old male who presented with 5-day history of SOB/exertional dyspnea, found to have bilateral pulmonary emboli with c/f R heart strain (RV/LV 1.7) and acute LLE DVT in setting of recent COVID-19 infection.   Past Medical History:   Past Medical History:  Diagnosis Date  . Asthma   . Crohn's disease (HCC) 05/23/2020   Diagnosed ~2019  . Deep vein thrombosis (DVT) of left lower extremity (HCC) 05/23/2020   L femoral, L popliteal veins 05/23/2020  . Headache(784.0)   . Pericardial effusion 05/23/2020  . Pulmonary emboli (HCC) 05/23/2020   Bilateral with e/o R heart strain on CTA   Significant Hospital Events:  2/23> admitted to CCU for acute bilateral submassive PE  Consults:  PCCM Cardiology  Procedures:   Significant Diagnostic Tests:   2/23 CXR >> Lungs clear, cardiac silhouette normal.  2/23 CTA Chest >> Extensive acute bilateral pulmonary emboli with e/o R heart strain (RV/LV 1.7) consistent with at least submassive (intermediate risk) PE; small wedge-shaped areas of increased density in the peripheral anterior medial left upper lobe and anterior right lower lobe along the major fissure could reflect small pulmonary infarcts; small pericardial effusion  2/23 CT A/P >> Progressive inflammatory changes of the cecum and appendiceal stump with new severe wall thickening and mucosal enhancement of the distal/terminal ileum, c/f IBD; no perforation or drainable fluid collection, progressive mesenteric lymphadenopathy  2/23 Echo >> EF 30-35%, moderately enlarged RV, elevated PA systolic pressure (RVSP 46.65mmHg). Mild RV diastolic collapse and large pericardial effusion.  2/23 LE Dopplers >> Acute L femoral and popliteal DVTs  2/25 Echo >>   Micro Data:  2/23 COVID, Flu A/B >>  Neg  Antimicrobials:  None   Interim History / Subjective:  Overnight, no acute events. Breathing is improved. Currently on room air and notes that he was able to sleep lying flat. No pain at this time.   Objective   Blood pressure 92/63, pulse (!) 117, temperature 99.2 F (37.3 C), resp. rate (!) 28, height 6\' 3"  (1.905 m), weight 83.3 kg, SpO2 95 %.        Intake/Output Summary (Last 24 hours) at 05/25/2020 1012 Last data filed at 05/25/2020 0800 Gross per 24 hour  Intake 2751.16 ml  Output 2400 ml  Net 351.16 ml   Filed Weights   05/23/20 1500 05/24/20 0353 05/25/20 0338  Weight: 104.3 kg 78.5 kg 83.3 kg    Examination: General: young generally well appearing male, no acute distress3 HENT: normocephalic, atraumatic, EOMI, moist mucus membranes Lungs: clear to auscultation bilaterally, on room air  Cardiovascular: Tachycardic, no murmurs/rubs/gallops Abdomen: nondistended, soft, nontender, active bowel sounds Extremities: warm/dry, distal pulses intact; trace bilateral lower extremity pitting edema Neuro: alert/orientedx4, spontaneously moving all extremities  Resolved Hospital Problem list    Assessment & Plan:  Acute bilateral pulmonary embolism Acute left lower extremity DVT Presented with 5 day history of exertional dyspnea. Noted to have acute bilateral PE with evidence of R heart strain. LE Dopplers with L femoral/popliteal DVT for which he was started on heparin gtt. He remains tachycardic and slightly hypotensive but currently on room air.  - Continue IV heparin gtt - Monitor respiratory status  Pericardial effusion Initially noted on CT and confirmed via Echo. Echo also noted to have decreased EF to  30-35%. Concerns for possible hemorrhagic transformation of pericardial effusion with tamponade with thrombolytics. Will continue to require close monitoring.  - Cardiology consulted, appreciate recommendations - Repeat Echo this AM - PICC line for CVP/co-ox  monitoring  - Continue cardiac monitoring   Crohn's disease  Initially diagnosed in 2019. Hx of frequent flares. Currently stable and tolerating diet. - Continue mesalamine   Best practice (evaluated daily)  Diet: Regular Pain/Anxiety/Delirium protocol (if indicated): None VAP protocol (if indicated): None DVT prophylaxis: Heparin gtt GI prophylaxis: None Glucose control: None Mobility: Bedrest Disposition:ICU  Goals of Care:  Code Status: FULL  Critical care time: 30 minutes    Eliezer Bottom, MD Internal Medicine, PGY-2 05/25/20 10:12 AM Pager # [phone]    Pulmonary critical care attending:  This is a 20 year old gentleman with bilateral pulmonary emboli, right heart strain recent COVID-19 infection. Patient had a echocardiogram with a new depressed ejection fraction, moderate sized pericardial effusion.  BP 100/66   Pulse (!) 121   Temp 98.4 F (36.9 C)   Resp (!) 28   Ht 6\' 3"  (1.905 m)   Wt 83.3 kg   SpO2 100%   BMI 22.95 kg/m   General: Young male HEENT: Tracking appropriately, NCAT Heart: Tachycardic, regular Lungs: Clear to auscultation bilaterally no crackles no wheeze Abdomen: Soft nontender nondistended  Labs: Reviewed Echo results reviewed, depressed ejection fraction CT imaging: reviewed  Assessment: Acute bilateral pulmonary embolism Lower extremity DVT Pericardial effusion Acute biventricular heart failure Crohn's disease History of COVID-19  Plan: Continue heparin alone Hemodynamics remain stable. Continue to advance diet as tolerated Okay to get up to use bathroom. If patient decompensates meaning has any hemodynamic instability or worsening of oxygen requirements would have low threshold for consideration of IV TPA Case discussed with heart failure service.  This patient is critically ill with multiple organ system failure; which, requires frequent high complexity decision making, assessment, support, evaluation, and titration  of therapies. This was completed through the application of advanced monitoring technologies and extensive interpretation of multiple databases. During this encounter critical care time was devoted to patient care services described in this note for 32 minutes.  , DO [facility] 05/25/2020 4:52 PM

## 2020-05-25 NOTE — Progress Notes (Signed)
ANTICOAGULATION CONSULT NOTE   Pharmacy Consult for Heparin Indication: Submassive pulmonary embolus  Labs: Recent Labs    05/23/20 1017 05/23/20 1458 05/23/20 2211 05/23/20 2211 05/24/20 0442 05/24/20 1232 05/24/20 2007 05/25/20 0034  HGB 10.7*  --   --   --  9.7*  --   --  9.0*  HCT 33.4*  --   --   --  31.0*  --   --  28.8*  PLT 405*  --   --   --  363  --   --  331  HEPARINUNFRC  --   --  0.15*   < > 0.20* 0.27* 0.37 0.30  CREATININE 1.20  --   --   --  1.16  --   --   --   TROPONINIHS  --  11 12  --   --   --   --   --    < > = values in this interval not displayed.   Assessment: 20 yo M presents with submassive PE with R heart strain (RV/LV ratio 1.7). Pt reported weight 230 lb in ED but RN weighed 2/24 and only 173 lb  Hgb low 9.0, PTLC 331 No bleeding reported.  Goal of Therapy:  Heparin level 0.3-0.7 units/ml Monitor platelets by anticoagulation protocol: Yes   Plan:  Increase heparin infusion to 2450 units/hr Monitor daily HL, CBC, s/sx bleeding  Thanks for allowing pharmacy to be a part of this patient's care.  Talbert Cage, PharmD Clinical Pharmacist  05/25/2020 1:49 AM

## 2020-05-26 ENCOUNTER — Encounter (HOSPITAL_COMMUNITY): Payer: Self-pay | Admitting: Pulmonary Disease

## 2020-05-26 DIAGNOSIS — I313 Pericardial effusion (noninflammatory): Secondary | ICD-10-CM | POA: Diagnosis not present

## 2020-05-26 DIAGNOSIS — K50919 Crohn's disease, unspecified, with unspecified complications: Secondary | ICD-10-CM | POA: Diagnosis not present

## 2020-05-26 DIAGNOSIS — I2609 Other pulmonary embolism with acute cor pulmonale: Secondary | ICD-10-CM | POA: Diagnosis not present

## 2020-05-26 DIAGNOSIS — I420 Dilated cardiomyopathy: Secondary | ICD-10-CM

## 2020-05-26 DIAGNOSIS — I824Y2 Acute embolism and thrombosis of unspecified deep veins of left proximal lower extremity: Secondary | ICD-10-CM | POA: Diagnosis not present

## 2020-05-26 LAB — BASIC METABOLIC PANEL
Anion gap: 7 (ref 5–15)
BUN: 10 mg/dL (ref 6–20)
CO2: 25 mmol/L (ref 22–32)
Calcium: 7.1 mg/dL — ABNORMAL LOW (ref 8.9–10.3)
Chloride: 101 mmol/L (ref 98–111)
Creatinine, Ser: 1.14 mg/dL (ref 0.61–1.24)
GFR, Estimated: >60 mL/min (ref >60)
Glucose, Bld: 84 mg/dL (ref 70–99)
Potassium: 4.2 mmol/L (ref 3.5–5.1)
Sodium: 133 mmol/L — ABNORMAL LOW (ref 135–145)

## 2020-05-26 LAB — CBC
HCT: 25.9 % — ABNORMAL LOW (ref 39.0–52.0)
Hemoglobin: 8.1 g/dL — ABNORMAL LOW (ref 13.0–17.0)
MCH: 21.4 pg — ABNORMAL LOW (ref 26.0–34.0)
MCHC: 31.3 g/dL (ref 30.0–36.0)
MCV: 68.5 fL — ABNORMAL LOW (ref 80.0–100.0)
Platelets: 277 10*3/uL (ref 150–400)
RBC: 3.78 MIL/uL — ABNORMAL LOW (ref 4.22–5.81)
RDW: 18.8 % — ABNORMAL HIGH (ref 11.5–15.5)
WBC: 5.3 10*3/uL (ref 4.0–10.5)
nRBC: 0 % (ref 0.0–0.2)

## 2020-05-26 LAB — COOXEMETRY PANEL
Carboxyhemoglobin: 0 % — ABNORMAL LOW (ref 0.5–1.5)
Methemoglobin: 2.3 % — ABNORMAL HIGH (ref 0.0–1.5)
O2 Saturation: 61.8 %
Total hemoglobin: 8.5 g/dL — ABNORMAL LOW (ref 12.0–16.0)

## 2020-05-26 LAB — HEPARIN LEVEL (UNFRACTIONATED): Heparin Unfractionated: 0.31 IU/mL (ref 0.30–0.70)

## 2020-05-26 NOTE — Progress Notes (Signed)
ANTICOAGULATION CONSULT NOTE   Pharmacy Consult for Heparin Indication: Submassive pulmonary embolus  Labs: Recent Labs    05/23/20 1458 05/23/20 2211 05/23/20 2211 05/24/20 0442 05/24/20 1232 05/25/20 0034 05/25/20 1114 05/26/20 0413  HGB  --   --    < > 9.7*  --  9.0*  --  8.1*  HCT  --   --   --  31.0*  --  28.8*  --  25.9*  PLT  --   --   --  363  --  331  --  277  HEPARINUNFRC  --  0.15*   < > 0.20*   < > 0.30 0.38 0.31  CREATININE  --   --   --  1.16  --  1.13  --  1.14  TROPONINIHS 11 12  --   --   --   --   --   --    < > = values in this interval not displayed.   Assessment: 20 yo M presents with LLE DVT +  submassive PE with R heart strain (RV/LV ratio 1.7) new EF 25% and pericardial effusion.  At risk for hemorrhagic transformation therefore no lytic used.   -heparin level 0.31 at low end of  goal on 2450 units/hr No bleeding noted, h/h slight drop watch pltc stable 277   Goal of Therapy:  Heparin level 0.3-0.7 units/ml Monitor platelets by anticoagulation protocol: Yes   Plan:  Increase heparin infusion  2550 units/hr to keep HL from dropping too low Monitor daily HL, CBC, s/sx bleeding   Leota Sauers Pharm.D. CPP, BCPS Clinical Pharmacist [phone] 05/26/2020 12:44 PM    **Pharmacist phone directory can now be found on [email or web address] ([email or web address]).  Listed under [facility].

## 2020-05-26 NOTE — Progress Notes (Signed)
Progress Note  Patient Name: Glenn [name] Date of Encounter: 05/26/2020  [facility] Cardiologist: Dr Jacques Navy  Subjective   Pt denies CP or dyspnea  Inpatient Medications    Scheduled Meds: . Chlorhexidine Gluconate Cloth  6 each Topical Daily  . furosemide  40 mg Intravenous BID  . influenza vac split quadrivalent PF  0.5 mL Intramuscular Tomorrow-1000  . Mesalamine  400 mg Oral Daily  . pantoprazole  40 mg Oral Daily  . potassium chloride  40 mEq Oral Once  . sodium chloride flush  10-40 mL Intracatheter Q12H   Continuous Infusions: . heparin 2,450 Units/hr (05/26/20 0600)  . lactated ringers 100 mL/hr at 05/26/20 0600   PRN Meds: sodium chloride flush   Vital Signs    Vitals:   05/26/20 0427 05/26/20 0457 05/26/20 0500 05/26/20 0600  BP:   98/60 (!) 96/53  Pulse:   (!) 111 (!) 106  Resp:      Temp:  98.7 F (37.1 C)    TempSrc:  Oral    SpO2:   96% 96%  Weight: 80.4 kg     Height:        Intake/Output Summary (Last 24 hours) at 05/26/2020 0723 Last data filed at 05/26/2020 0600 Gross per 24 hour  Intake 4420.01 ml  Output 3750 ml  Net 670.01 ml   Last 3 Weights 05/26/2020 05/25/2020 05/24/2020  Weight (lbs) 177 lb 4 oz 183 lb 10.3 oz 173 lb 1 oz  Weight (kg) 80.4 kg 83.3 kg 78.5 kg      Telemetry    Sinus tachycardia- Personally Reviewed   Physical Exam   GEN: No acute distress.   Neck: No JVD Cardiac: regular, tachycardic, +gallop Respiratory: Clear to auscultation bilaterally. GI: Soft, nontender, non-distended  MS: Trace edema Neuro:  Nonfocal  Psych: Normal affect   Labs    High Sensitivity Troponin:   Recent Labs  Lab 05/23/20 1458 05/23/20 2211  TROPONINIHS 11 12      Chemistry Recent Labs  Lab 05/23/20 1017 05/24/20 0442 05/25/20 0034 05/26/20 0413  NA 133* 134* 133* 133*  K 4.2 4.2 4.1 4.2  CL 100 101 103 101  CO2 23 25 22 25   GLUCOSE 110* 102* 92 84  BUN 10 10 10 10   CREATININE 1.20 1.16 1.13 1.14   CALCIUM 7.8* 7.6* 7.0* 7.1*  PROT 6.1*  --   --   --   ALBUMIN 1.6*  --   --   --   AST 11*  --   --   --   ALT 11  --   --   --   ALKPHOS 101  --   --   --   BILITOT 0.4  --   --   --   GFRNONAA >60 >60 >60 >60  ANIONGAP 10 8 8 7      Hematology Recent Labs  Lab 05/24/20 0442 05/25/20 0034 05/26/20 0413  WBC 6.4 4.9 5.3  RBC 4.56 4.24 3.78*  HGB 9.7* 9.0* 8.1*  HCT 31.0* 28.8* 25.9*  MCV 68.0* 67.9* 68.5*  MCH 21.3* 21.2* 21.4*  MCHC 31.3 31.3 31.3  RDW 19.1* 18.9* 18.8*  PLT 363 331 277    BNP Recent Labs  Lab 05/23/20 1307  BNP 355.4*     Radiology    ECHOCARDIOGRAM LIMITED  Result Date: 05/25/2020    ECHOCARDIOGRAM LIMITED REPORT   Patient Name:   Glenn [name] Date of Exam: 05/25/2020 Medical Rec #:  [id number]          Height:       75.0 in Accession #:    [id number]         Weight:       183.6 lb Date of Birth:  [birth year]          BSA:          2.116 m Patient Age:    20 years           BP:           92/63 mmHg Patient Gender: M                  HR:           117 bpm. Exam Location:  Inpatient Procedure: 3D Echo, Limited Echo, Cardiac Doppler and Color Doppler Indications:    I50.9* Heart failure (unspecified)  History:        Patient has prior history of Echocardiogram examinations, most                 recent 05/23/2020. Abnormal ECG. Pulmonary embolus. Pericardial                 effusion.  Sonographer:    Sheralyn Boatman RDCS Referring Phys: [id number] GRACE E BOWSER IMPRESSIONS  1. Left ventricular ejection fraction, by estimation, is 25 to 30%. The left ventricle has severely decreased function. The left ventricle demonstrates global hypokinesis. There is mild left ventricular hypertrophy.  2. McConnell's sign is present - apical sparing of contraction seen in PE. Right ventricular systolic function is moderately reduced. The right ventricular size is moderately enlarged. There is moderately elevated pulmonary artery systolic pressure. The  estimated right ventricular  systolic pressure is 49.6 mmHg.  3. Right atrial size was severely dilated.  4. Moderate pericardial effusion. The pericardial effusion is circumferential. There is no evidence of cardiac tamponade.  5. Tricuspid valve regurgitation is mild to moderate. FINDINGS  Left Ventricle: Left ventricular ejection fraction, by estimation, is 25 to 30%. The left ventricle has severely decreased function. The left ventricle demonstrates global hypokinesis. There is mild left ventricular hypertrophy. Right Ventricle: McConnell's sign is present - apical sparing of contraction seen in PE. The right ventricular size is moderately enlarged. Right ventricular systolic function is moderately reduced. There is moderately elevated pulmonary artery systolic pressure. The tricuspid regurgitant velocity is 2.94 m/s, and with an assumed right atrial pressure of 15 mmHg, the estimated right ventricular systolic pressure is 49.6 mmHg. Right Atrium: Right atrial size was severely dilated. Pericardium: A moderately sized pericardial effusion is present. The pericardial effusion is circumferential. There is no evidence of cardiac tamponade. Tricuspid Valve: Tricuspid valve regurgitation is mild to moderate. LEFT VENTRICLE PLAX 2D LVIDd:         4.30 cm LVIDs:         4.20 cm LV PW:         1.30 cm LV IVS:        1.20 cm  LV Volumes (MOD) LV vol d, MOD A2C: 116.5 ml LV vol d, MOD A4C: 70.8 ml LV vol s, MOD A2C: 73.5 ml LV vol s, MOD A4C: 54.3 ml LV SV MOD A2C:     43.0 ml LV SV MOD A4C:     70.8 ml LV SV MOD BP:      29.6 ml IVC IVC diam: 2.10 cm LEFT ATRIUM         Index LA diam:  3.40 cm 1.61 cm/m   AORTA Ao Root diam: 2.90 cm Ao Asc diam:  2.30 cm TRICUSPID VALVE TR Peak grad:   34.6 mmHg TR Vmax:        294.00 cm/s Donato Schultz MD Electronically signed by Donato Schultz MD Signature Date/Time: 05/25/2020/11:33:53 AM    Final    [place] EKG SITE RITE  Result Date: 05/25/2020 If Site Rite image not attached, placement could not be confirmed due  to current cardiac rhythm.   Patient Profile     20 y.o. male with past medical history of Crohn's, asthma and migraines admitted with pulmonary embolus, cardiomyopathy and pericardial effusion.  Assessment & Plan    1 status post pulmonary embolus-Patient with severe RV dysfunction on echocardiogram.  Felt not to be a candidate for thrombolytic therapy due to pericardial effusion and risk of hemorrhagic transformation.  Much improved this morning.  Saturating at 96% on room air.  Continue IV heparin.  Will ultimately need to be transition to apixaban.  2 cardiomyopathy-etiology unclear.  Unlikely to be ischemic given patient's age and no risk factors.  Question stress-induced cardiomyopathy.  No history of alcohol use.  Patient remains mildly volume overloaded today with CVP 13.  We will continue Lasix at present dose.  Cannot add ARB or Entresto at this point as blood pressure is borderline.  Note coox 61.8.  Patient will ultimately need cardiac MRI when he improves.  3 pericardial effusion-we will plan repeat limited echocardiogram on Monday to reassess.  4 Crohn's disease-continue mesalamine.  Remains anemic.  Follow hemoglobin.  For questions or updates, please contact [facility] Please consult [email or web address] for contact info under        Signed, Olga Millers, MD  05/26/2020, 7:23 AM

## 2020-05-26 NOTE — Significant Event (Signed)
Patient arrived safely to 6E25, transported via wheelchair with RN and patient's father. All personal belongings taken to new room (cellphone, clothes, shoes, balloons). Patient settled in bed; receiving staff in room prior to RN leaving. Report given to receiving RN.

## 2020-05-26 NOTE — Plan of Care (Signed)
  Problem: Activity: Goal: Risk for activity intolerance will decrease Outcome: Progressing   Problem: Nutrition: Goal: Adequate nutrition will be maintained Outcome: Progressing   

## 2020-05-26 NOTE — Progress Notes (Addendum)
NAME:  Glenn [name], MRN:  [id number], DOB:  May 22, 2000, LOS: 3 ADMISSION DATE:  05/23/2020, CONSULTATION DATE:  05/23/2020 REFERRING MD:  EDP, CHIEF COMPLAINT:  SOB   Brief History:  20 year old male who presented with 5-day history of SOB/exertional dyspnea, found to have bilateral pulmonary emboli with c/f R heart strain (RV/LV 1.7) and acute LLE DVT in setting of recent COVID-19 infection.   Past Medical History:   Past Medical History:  Diagnosis Date  . Asthma   . Crohn's disease (HCC) 05/23/2020   Diagnosed ~2019  . Deep vein thrombosis (DVT) of left lower extremity (HCC) 05/23/2020   L femoral, L popliteal veins 05/23/2020  . Headache(784.0)   . Pericardial effusion 05/23/2020  . Pulmonary emboli (HCC) 05/23/2020   Bilateral with e/o R heart strain on CTA  . Sickle cell trait (HCC) September 01, 1999   Significant Hospital Events:  2/23> admitted to CCU for acute bilateral submassive PE  Consults:  PCCM Cardiology  Procedures:   Significant Diagnostic Tests:   2/23 CXR >> Lungs clear, cardiac silhouette normal.  2/23 CTA Chest >> Extensive acute bilateral pulmonary emboli with e/o R heart strain (RV/LV 1.7) consistent with at least submassive (intermediate risk) PE; small wedge-shaped areas of increased density in the peripheral anterior medial left upper lobe and anterior right lower lobe along the major fissure could reflect small pulmonary infarcts; small pericardial effusion  2/23 CT A/P >> Progressive inflammatory changes of the cecum and appendiceal stump with new severe wall thickening and mucosal enhancement of the distal/terminal ileum, c/f IBD; no perforation or drainable fluid collection, progressive mesenteric lymphadenopathy  2/23 Echo >> EF 30-35%, moderately enlarged RV, elevated PA systolic pressure (RVSP 46.1mmHg). Mild RV diastolic collapse and large pericardial effusion.  2/23 LE Dopplers >> Acute L femoral and popliteal DVTs  2/25 Echo >>   Micro  Data:  2/23 COVID, Flu A/B >> Neg  Antimicrobials:  None   Interim History / Subjective:  Sitting up comfortable on room air  Objective   Blood pressure (!) 96/53, pulse (!) 106, temperature 98.4 F (36.9 C), temperature source Oral, resp. rate (!) 28, height 6\' 3"  (1.905 m), weight 80.4 kg, SpO2 96 %. CVP:  [5 mmHg-13 mmHg] 5 mmHg      Intake/Output Summary (Last 24 hours) at 05/26/2020 1036 Last data filed at 05/26/2020 0800 Gross per 24 hour  Intake 4288.05 ml  Output 3450 ml  Net 838.05 ml   Filed Weights   05/24/20 0353 05/25/20 0338 05/26/20 0427  Weight: 78.5 kg 83.3 kg 80.4 kg    Examination: General: Young male resting in bed HENT: NCAT tracking appropriately Lungs: Clear to auscultation bilaterally Cardiovascular: Tachycardic, regular Abdomen: Soft nontender nondistended Extremities: Trace dependent edema Neuro: Alert oriented following commands  Resolved Hospital Problem list    Assessment & Plan:   Acute bilateral pulmonary embolism Acute left lower extremity DVT Plan: Continue IV heparin Ultimately will need transition to DOAC  Heart failure with reduced ejection fraction Biventricular failure Pericardial effusion Due to pericardial effusion initial thoughts regarding TPA were held. Plan: Goal-directed heart failure management per cardiology services. Lasix as tolerated per cardiology services Will need cardiac MRI Repeat limited 2D echo pending  Crohn's disease  Initially diagnosed in 2019. Hx of frequent flares. Currently stable and tolerating diet. Plan: Continue mesalamine  Hyponatremia  Anemia No overt sign of bleeding Continue to observe  Stable for transfer from the intensive care unit.  Will request transfer to  [facility] hospitalist service.  Dr. Robb Matar has agreed to pick up patient for tomorrow.  Best practice (evaluated daily)  Diet: Regular Pain/Anxiety/Delirium protocol (if indicated): None VAP protocol (if indicated):  None DVT prophylaxis: Heparin gtt GI prophylaxis: None Glucose control: None Mobility: Bedrest Disposition:ICU  Goals of Care:  Code Status: FULL   Josephine Igo, DO [facility] 05/26/2020 10:36 AM

## 2020-05-27 LAB — HEPARIN LEVEL (UNFRACTIONATED)
Heparin Unfractionated: 0.29 IU/mL — ABNORMAL LOW (ref 0.30–0.70)
Heparin Unfractionated: 0.39 IU/mL (ref 0.30–0.70)

## 2020-05-27 LAB — BASIC METABOLIC PANEL
Anion gap: 7 (ref 5–15)
BUN: 11 mg/dL (ref 6–20)
CO2: 25 mmol/L (ref 22–32)
Calcium: 7 mg/dL — ABNORMAL LOW (ref 8.9–10.3)
Chloride: 102 mmol/L (ref 98–111)
Creatinine, Ser: 1.23 mg/dL (ref 0.61–1.24)
GFR, Estimated: >60 mL/min (ref >60)
Glucose, Bld: 115 mg/dL — ABNORMAL HIGH (ref 70–99)
Potassium: 3.9 mmol/L (ref 3.5–5.1)
Sodium: 134 mmol/L — ABNORMAL LOW (ref 135–145)

## 2020-05-27 LAB — CBC
HCT: 27 % — ABNORMAL LOW (ref 39.0–52.0)
Hemoglobin: 8.7 g/dL — ABNORMAL LOW (ref 13.0–17.0)
MCH: 21.8 pg — ABNORMAL LOW (ref 26.0–34.0)
MCHC: 32.2 g/dL (ref 30.0–36.0)
MCV: 67.5 fL — ABNORMAL LOW (ref 80.0–100.0)
Platelets: 297 10*3/uL (ref 150–400)
RBC: 4 MIL/uL — ABNORMAL LOW (ref 4.22–5.81)
RDW: 18.7 % — ABNORMAL HIGH (ref 11.5–15.5)
WBC: 5.1 10*3/uL (ref 4.0–10.5)
nRBC: 0 % (ref 0.0–0.2)

## 2020-05-27 LAB — COOXEMETRY PANEL
Carboxyhemoglobin: 0 % — ABNORMAL LOW (ref 0.5–1.5)
Methemoglobin: 2.4 % — ABNORMAL HIGH (ref 0.0–1.5)
O2 Saturation: 60.1 %
Total hemoglobin: 8.9 g/dL — ABNORMAL LOW (ref 12.0–16.0)

## 2020-05-27 MED ORDER — LOSARTAN POTASSIUM 25 MG PO TABS
25.0000 mg | ORAL_TABLET | Freq: Every day | ORAL | Status: DC
  Administered 2020-05-27 – 2020-05-29 (×3): 25 mg via ORAL
  Filled 2020-05-27 (×3): qty 1

## 2020-05-27 NOTE — Progress Notes (Signed)
Progress Note  Patient Name: Glenn [name] Date of Encounter: 05/27/2020  [facility] Cardiologist: Dr Jacques Navy  Subjective   Dyspnea resolved; no CP  Inpatient Medications    Scheduled Meds: . Chlorhexidine Gluconate Cloth  6 each Topical Daily  . influenza vac split quadrivalent PF  0.5 mL Intramuscular Tomorrow-1000  . Mesalamine  400 mg Oral Daily  . pantoprazole  40 mg Oral Daily  . potassium chloride  40 mEq Oral Once  . sodium chloride flush  10-40 mL Intracatheter Q12H   Continuous Infusions: . heparin 2,700 Units/hr (05/27/20 0724)  . lactated ringers Stopped (05/26/20 0855)   PRN Meds: sodium chloride flush   Vital Signs    Vitals:   05/26/20 2340 05/27/20 0008 05/27/20 0408 05/27/20 0410  BP:  107/69  (!) 100/57  Pulse:  (!) 123    Resp: 15 18  18   Temp: 99.7 F (37.6 C) 99.1 F (37.3 C) 98 F (36.7 C)   TempSrc: Oral Oral Oral   SpO2: 99% 100%    Weight:      Height:        Intake/Output Summary (Last 24 hours) at 05/27/2020 0740 Last data filed at 05/27/2020 5945 Gross per 24 hour  Intake 1268.18 ml  Output 1650 ml  Net -381.82 ml   Last 3 Weights 05/26/2020 05/25/2020 05/24/2020  Weight (lbs) 177 lb 4 oz 183 lb 10.3 oz 173 lb 1 oz  Weight (kg) 80.4 kg 83.3 kg 78.5 kg      Telemetry    Sinus tachycardia- Personally Reviewed   Physical Exam   GEN: WD/WN NAD.   Neck: supple Cardiac: Tachycardic, + gallop Respiratory: CTA GI: Soft, NT/ND MS: no edema Neuro:  Grossly intact Psych: Normal affect   Labs    High Sensitivity Troponin:   Recent Labs  Lab 05/23/20 1458 05/23/20 2211  TROPONINIHS 11 12      Chemistry Recent Labs  Lab 05/23/20 1017 05/24/20 0442 05/25/20 0034 05/26/20 0413 05/27/20 0450  NA 133*   < > 133* 133* 134*  K 4.2   < > 4.1 4.2 3.9  CL 100   < > 103 101 102  CO2 23   < > 22 25 25   GLUCOSE 110*   < > 92 84 115*  BUN 10   < > 10 10 11   CREATININE 1.20   < > 1.13 1.14 1.23  CALCIUM 7.8*   <  > 7.0* 7.1* 7.0*  PROT 6.1*  --   --   --   --   ALBUMIN 1.6*  --   --   --   --   AST 11*  --   --   --   --   ALT 11  --   --   --   --   ALKPHOS 101  --   --   --   --   BILITOT 0.4  --   --   --   --   GFRNONAA >60   < > >60 >60 >60  ANIONGAP 10   < > 8 7 7    < > = values in this interval not displayed.     Hematology Recent Labs  Lab 05/25/20 0034 05/26/20 0413 05/27/20 0450  WBC 4.9 5.3 5.1  RBC 4.24 3.78* 4.00*  HGB 9.0* 8.1* 8.7*  HCT 28.8* 25.9* 27.0*  MCV 67.9* 68.5* 67.5*  MCH 21.2* 21.4* 21.8*  MCHC 31.3 31.3 32.2  RDW  18.9* 18.8* 18.7*  PLT 331 277 297    BNP Recent Labs  Lab 05/23/20 1307  BNP 355.4*     Radiology    ECHOCARDIOGRAM LIMITED  Result Date: 05/25/2020    ECHOCARDIOGRAM LIMITED REPORT   Patient Name:   Glenn [name] Date of Exam: 05/25/2020 Medical Rec #:  [id number]          Height:       75.0 in Accession #:    [id number]         Weight:       183.6 lb Date of Birth:  Nov 08, 2000          BSA:          2.116 m Patient Age:    20 years           BP:           92/63 mmHg Patient Gender: M                  HR:           117 bpm. Exam Location:  Inpatient Procedure: 3D Echo, Limited Echo, Cardiac Doppler and Color Doppler Indications:    I50.9* Heart failure (unspecified)  History:        Patient has prior history of Echocardiogram examinations, most                 recent 05/23/2020. Abnormal ECG. Pulmonary embolus. Pericardial                 effusion.  Sonographer:    Sheralyn Boatman RDCS Referring Phys: [id number] GRACE E BOWSER IMPRESSIONS  1. Left ventricular ejection fraction, by estimation, is 25 to 30%. The left ventricle has severely decreased function. The left ventricle demonstrates global hypokinesis. There is mild left ventricular hypertrophy.  2. McConnell's sign is present - apical sparing of contraction seen in PE. Right ventricular systolic function is moderately reduced. The right ventricular size is moderately enlarged. There is moderately  elevated pulmonary artery systolic pressure. The  estimated right ventricular systolic pressure is 49.6 mmHg.  3. Right atrial size was severely dilated.  4. Moderate pericardial effusion. The pericardial effusion is circumferential. There is no evidence of cardiac tamponade.  5. Tricuspid valve regurgitation is mild to moderate. FINDINGS  Left Ventricle: Left ventricular ejection fraction, by estimation, is 25 to 30%. The left ventricle has severely decreased function. The left ventricle demonstrates global hypokinesis. There is mild left ventricular hypertrophy. Right Ventricle: McConnell's sign is present - apical sparing of contraction seen in PE. The right ventricular size is moderately enlarged. Right ventricular systolic function is moderately reduced. There is moderately elevated pulmonary artery systolic pressure. The tricuspid regurgitant velocity is 2.94 m/s, and with an assumed right atrial pressure of 15 mmHg, the estimated right ventricular systolic pressure is 49.6 mmHg. Right Atrium: Right atrial size was severely dilated. Pericardium: A moderately sized pericardial effusion is present. The pericardial effusion is circumferential. There is no evidence of cardiac tamponade. Tricuspid Valve: Tricuspid valve regurgitation is mild to moderate. LEFT VENTRICLE PLAX 2D LVIDd:         4.30 cm LVIDs:         4.20 cm LV PW:         1.30 cm LV IVS:        1.20 cm  LV Volumes (MOD) LV vol d, MOD A2C: 116.5 ml LV vol d, MOD A4C: 70.8 ml LV vol s, MOD A2C:  73.5 ml LV vol s, MOD A4C: 54.3 ml LV SV MOD A2C:     43.0 ml LV SV MOD A4C:     70.8 ml LV SV MOD BP:      29.6 ml IVC IVC diam: 2.10 cm LEFT ATRIUM         Index LA diam:    3.40 cm 1.61 cm/m   AORTA Ao Root diam: 2.90 cm Ao Asc diam:  2.30 cm TRICUSPID VALVE TR Peak grad:   34.6 mmHg TR Vmax:        294.00 cm/s Donato Schultz MD Electronically signed by Donato Schultz MD Signature Date/Time: 05/25/2020/11:33:53 AM    Final    [place] EKG SITE RITE  Result Date:  05/25/2020 If Site Rite image not attached, placement could not be confirmed due to current cardiac rhythm.   Patient Profile     20 y.o. male with past medical history of Crohn's, asthma and migraines admitted with pulmonary embolus, cardiomyopathy and pericardial effusion.  Assessment & Plan    1 status post pulmonary embolus-patient symptomatically improved.  Previous CTA showed bilateral pulmonary emboli with RV strain.  He has severe RV dysfunction on echocardiogram.  He was felt not to be a candidate for thrombolytic therapy due to pericardial effusion and risk of hemorrhagic transformation.  Continue IV heparin.  Will transition to apixaban prior to discharge.  Note etiology unclear as he has not had recent travel or surgery.  2 cardiomyopathy-coox today 60.1.  Symptomatically much improved.  Will not diurese further at this point.  We will add low-dose losartan at 25 mg daily to see if blood pressure tolerates.  Add beta-blockade later if blood pressure allows.  Etiology of CM unclear.  Unlikely to be ischemic given patient's age and no risk factors.  Question stress-induced cardiomyopathy.  No history of alcohol use. Patient will ultimately need cardiac MRI as he improves.  3 pericardial effusion-plan follow-up echocardiogram tomorrow to reassess.  If unchanged will begin anticoagulation at that time.  4 Crohn's disease-continue mesalamine.  Remains anemic.  Follow hemoglobin.  For questions or updates, please contact [facility] Please consult [email or web address] for contact info under        Signed, Olga Millers, MD  05/27/2020, 7:40 AM

## 2020-05-27 NOTE — Progress Notes (Signed)
PROGRESS NOTE    Glenn [name]  ZOX:[id number] DOB: 03-02-2001 DOA: 05/23/2020 PCP: Ihor Gully, MD   Brief Narrative: 20 year old with past medical history significant for Crohn's, asthma who presents with 5 days history of shortness of breath/exertional dyspnea, found to have bilateral pulmonary emboli with c/f Right heart strain and acute left lower extremity DVT in the setting of recent Covid infection and  Crohn's disease.  Patient was a started on IV heparin drip, he was also found to have biventricular heart failure.  Heart failure team consulted and following.  Assessment & Plan:   Active Problems:   Pulmonary emboli (HCC)   Crohn's disease (HCC)   Pericardial effusion   Deep vein thrombosis (DVT) of left lower extremity (HCC)  1-Acute Bilateral Pulmonary Embolism, Acute left lower extremity DVT: Right heart  Strain: -Plan to continue with IV heparin for now.  Plan to repeat echocardiogram to evaluate pericardial effusion 2/28. -Ultimately plan is to transition to DOAC. -Vitals are stable, currently on room air. -He was found not to be a candidate for thrombolytic therapy due to pericardial effusion and risk for hemorrhagic transformation.  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  -Plan to start Cozaar 05/27/2020. -He will need a cardiac MRI to further evaluate for myocarditis or infiltrative disease. -Plan to repeat echo tomorrow -received two doses of lasix.   3-Crohn's disease: Initially diagnosed in 2019, history of frequent flares. On admission he was having some diarrhea and abdominal pain but has since resolved Continue with mesalamine.  4-Hyponatremia: Mild improved  Anemia: Stable 8.7 today. Will check anemia panel tomorrow     Estimated body mass index is 22.15 kg/m as calculated from  the following:   Height as of this encounter: 6\' 3"  (1.905 m).   Weight as of this encounter: 80.4 kg.   DVT prophylaxis:  Code Status: Full code Family Communication: Care discussed with patient Disposition Plan:  Status is: Inpatient  Remains inpatient appropriate because:IV treatments appropriate due to intensity of illness or inability to take PO   Dispo: The patient is from: Home              Anticipated d/c is to: Home              Patient currently is not medically stable to d/c.   Difficult to place patient No        Consultants:   Cardiology  CMM admitted patient  Procedures:  Echo:  Antimicrobials:    Subjective: He is alert and conversant, he denies chest pain, his breathing better.  He is on room air. He had some diarrhea and abdominal pain on presentation but currently he is having 3 bowel movement per day 6 baseline.  He denies abdominal pain.  Objective: Vitals:   05/26/20 2340 05/27/20 0008 05/27/20 0408 05/27/20 0410  BP:  107/69  (!) 100/57  Pulse:  (!) 123    Resp: 15 18  18   Temp: 99.7 F (37.6 C) 99.1 F (37.3 C) 98 F (36.7 C)   TempSrc: Oral Oral Oral   SpO2: 99% 100%    Weight:      Height:        Intake/Output Summary (Last 24 hours) at 05/27/2020 0734 Last data filed at 05/27/2020 8119 Gross per 24 hour  Intake 1268.18 ml  Output 1650 ml  Net -381.82 ml   Ceasar Mons  Weights   05/24/20 0353 05/25/20 0338 05/26/20 0427  Weight: 78.5 kg 83.3 kg 80.4 kg    Examination:  General exam: Appears calm and comfortable  Respiratory system: Clear to auscultation. Respiratory effort normal. Cardiovascular system: S1 & S2 heard, RRR. No JVD, murmurs, rubs, gallops or clicks. No pedal edema. Gastrointestinal system: Abdomen is nondistended, soft and nontender. No organomegaly or masses felt. Normal bowel sounds heard. Central nervous system: Alert and oriented. No focal neurological deficits. Extremities: Symmetric 5 x 5 power. Left with  edema   Data Reviewed: I have personally reviewed following labs and imaging studies  CBC: Recent Labs  Lab 05/23/20 1017 05/24/20 0442 05/25/20 0034 05/26/20 0413 05/27/20 0450  WBC 7.5 6.4 4.9 5.3 5.1  HGB 10.7* 9.7* 9.0* 8.1* 8.7*  HCT 33.4* 31.0* 28.8* 25.9* 27.0*  MCV 68.4* 68.0* 67.9* 68.5* 67.5*  PLT 405* 363 331 277 297   Basic Metabolic Panel: Recent Labs  Lab 05/23/20 1017 05/24/20 0442 05/25/20 0034 05/26/20 0413 05/27/20 0450  NA 133* 134* 133* 133* 134*  K 4.2 4.2 4.1 4.2 3.9  CL 100 101 103 101 102  CO2 23 25 22 25 25   GLUCOSE 110* 102* 92 84 115*  BUN 10 10 10 10 11   CREATININE 1.20 1.16 1.13 1.14 1.23  CALCIUM 7.8* 7.6* 7.0* 7.1* 7.0*  MG  --  1.7  --   --   --   PHOS  --  3.9  --   --   --    GFR: Estimated Creatinine Clearance: 108 mL/min (by C-G formula based on SCr of 1.23 mg/dL). Liver Function Tests: Recent Labs  Lab 05/23/20 1017  AST 11*  ALT 11  ALKPHOS 101  BILITOT 0.4  PROT 6.1*  ALBUMIN 1.6*   Recent Labs  Lab 05/23/20 1017  LIPASE 18   No results for input(s): AMMONIA in the last 168 hours. Coagulation Profile: No results for input(s): INR, PROTIME in the last 168 hours. Cardiac Enzymes: No results for input(s): CKTOTAL, CKMB, CKMBINDEX, TROPONINI in the last 168 hours. BNP (last 3 results) No results for input(s): PROBNP in the last 8760 hours. HbA1C: No results for input(s): HGBA1C in the last 72 hours. CBG: No results for input(s): GLUCAP in the last 168 hours. Lipid Profile: No results for input(s): CHOL, HDL, LDLCALC, TRIG, CHOLHDL, LDLDIRECT in the last 72 hours. Thyroid Function Tests: No results for input(s): TSH, T4TOTAL, FREET4, T3FREE, THYROIDAB in the last 72 hours. Anemia Panel: No results for input(s): VITAMINB12, FOLATE, FERRITIN, TIBC, IRON, RETICCTPCT in the last 72 hours. Sepsis Labs: No results for input(s): PROCALCITON, LATICACIDVEN in the last 168 hours.  Recent Results (from the past 240  hour(s))  Resp Panel by RT-PCR (Flu A&B, Covid) Nasopharyngeal Swab     Status: None   Collection Time: 05/23/20  2:58 PM   Specimen: Nasopharyngeal Swab; Nasopharyngeal(NP) swabs in vial transport medium  Result Value Ref Range Status   SARS Coronavirus 2 by RT PCR NEGATIVE NEGATIVE Final    Comment: (NOTE) SARS-CoV-2 target nucleic acids are NOT DETECTED.  The SARS-CoV-2 RNA is generally detectable in upper respiratory specimens during the acute phase of infection. The lowest concentration of SARS-CoV-2 viral copies this assay can detect is 138 copies/mL. A negative result does not preclude SARS-Cov-2 infection and should not be used as the sole basis for treatment or other patient management decisions. A negative result may occur with  improper specimen collection/handling, submission of specimen other than nasopharyngeal swab,  presence of viral mutation(s) within the areas targeted by this assay, and inadequate number of viral copies(<138 copies/mL). A negative result must be combined with clinical observations, patient history, and epidemiological information. The expected result is Negative.  Fact Sheet for Patients:  BloggerCourse.com  Fact Sheet for Healthcare Providers:  SeriousBroker.it  This test is no t yet approved or cleared by the [place] FDA and  has been authorized for detection and/or diagnosis of SARS-CoV-2 by FDA under an Emergency Use Authorization (EUA). This EUA will remain  in effect (meaning this test can be used) for the duration of the COVID-19 declaration under Section 564(b)(1) of the Act, 21 U.S.C.section 360bbb-3(b)(1), unless the authorization is terminated  or revoked sooner.       Influenza A by PCR NEGATIVE NEGATIVE Final   Influenza B by PCR NEGATIVE NEGATIVE Final    Comment: (NOTE) The Xpert Xpress SARS-CoV-2/FLU/RSV plus assay is intended as an aid in the diagnosis of influenza from  Nasopharyngeal swab specimens and should not be used as a sole basis for treatment. Nasal washings and aspirates are unacceptable for Xpert Xpress SARS-CoV-2/FLU/RSV testing.  Fact Sheet for Patients: BloggerCourse.com  Fact Sheet for Healthcare Providers: SeriousBroker.it  This test is not yet approved or cleared by the [place] FDA and has been authorized for detection and/or diagnosis of SARS-CoV-2 by FDA under an Emergency Use Authorization (EUA). This EUA will remain in effect (meaning this test can be used) for the duration of the COVID-19 declaration under Section 564(b)(1) of the Act, 21 U.S.C. section 360bbb-3(b)(1), unless the authorization is terminated or revoked.  Performed at [facility], [address], [place], [place] [address]   MRSA PCR Screening     Status: None   Collection Time: 05/23/20 11:37 PM   Specimen: Nasopharyngeal  Result Value Ref Range Status   MRSA by PCR NEGATIVE NEGATIVE Final    Comment:        The GeneXpert MRSA Assay (FDA approved for NASAL specimens only), is one component of a comprehensive MRSA colonization surveillance program. It is not intended to diagnose MRSA infection nor to guide or monitor treatment for MRSA infections. Performed at [facility], [address] [place], [place] [address]          Radiology Studies: ECHOCARDIOGRAM LIMITED  Result Date: 05/25/2020    ECHOCARDIOGRAM LIMITED REPORT   Patient Name:   Glenn [name] Date of Exam: 05/25/2020 Medical Rec #:  [id number]          Height:       75.0 in Accession #:    [id number]         Weight:       183.6 lb Date of Birth:  [birth year]          BSA:          2.116 m Patient Age:    20 years           BP:           92/63 mmHg Patient Gender: M                  HR:           117 bpm. Exam Location:  Inpatient Procedure: 3D Echo, Limited Echo, Cardiac Doppler and Color Doppler Indications:     I50.9* Heart failure (unspecified)  History:        Patient has prior history of Echocardiogram examinations, most  recent 05/23/2020. Abnormal ECG. Pulmonary embolus. Pericardial                 effusion.  Sonographer:    Sheralyn Boatman RDCS Referring Phys: [id number] GRACE E BOWSER IMPRESSIONS  1. Left ventricular ejection fraction, by estimation, is 25 to 30%. The left ventricle has severely decreased function. The left ventricle demonstrates global hypokinesis. There is mild left ventricular hypertrophy.  2. McConnell's sign is present - apical sparing of contraction seen in PE. Right ventricular systolic function is moderately reduced. The right ventricular size is moderately enlarged. There is moderately elevated pulmonary artery systolic pressure. The  estimated right ventricular systolic pressure is 49.6 mmHg.  3. Right atrial size was severely dilated.  4. Moderate pericardial effusion. The pericardial effusion is circumferential. There is no evidence of cardiac tamponade.  5. Tricuspid valve regurgitation is mild to moderate. FINDINGS  Left Ventricle: Left ventricular ejection fraction, by estimation, is 25 to 30%. The left ventricle has severely decreased function. The left ventricle demonstrates global hypokinesis. There is mild left ventricular hypertrophy. Right Ventricle: McConnell's sign is present - apical sparing of contraction seen in PE. The right ventricular size is moderately enlarged. Right ventricular systolic function is moderately reduced. There is moderately elevated pulmonary artery systolic pressure. The tricuspid regurgitant velocity is 2.94 m/s, and with an assumed right atrial pressure of 15 mmHg, the estimated right ventricular systolic pressure is 49.6 mmHg. Right Atrium: Right atrial size was severely dilated. Pericardium: A moderately sized pericardial effusion is present. The pericardial effusion is circumferential. There is no evidence of cardiac tamponade. Tricuspid  Valve: Tricuspid valve regurgitation is mild to moderate. LEFT VENTRICLE PLAX 2D LVIDd:         4.30 cm LVIDs:         4.20 cm LV PW:         1.30 cm LV IVS:        1.20 cm  LV Volumes (MOD) LV vol d, MOD A2C: 116.5 ml LV vol d, MOD A4C: 70.8 ml LV vol s, MOD A2C: 73.5 ml LV vol s, MOD A4C: 54.3 ml LV SV MOD A2C:     43.0 ml LV SV MOD A4C:     70.8 ml LV SV MOD BP:      29.6 ml IVC IVC diam: 2.10 cm LEFT ATRIUM         Index LA diam:    3.40 cm 1.61 cm/m   AORTA Ao Root diam: 2.90 cm Ao Asc diam:  2.30 cm TRICUSPID VALVE TR Peak grad:   34.6 mmHg TR Vmax:        294.00 cm/s Donato Schultz MD Electronically signed by Donato Schultz MD Signature Date/Time: 05/25/2020/11:33:53 AM    Final    [place] EKG SITE RITE  Result Date: 05/25/2020 If Site Rite image not attached, placement could not be confirmed due to current cardiac rhythm.       Scheduled Meds: . Chlorhexidine Gluconate Cloth  6 each Topical Daily  . influenza vac split quadrivalent PF  0.5 mL Intramuscular Tomorrow-1000  . Mesalamine  400 mg Oral Daily  . pantoprazole  40 mg Oral Daily  . potassium chloride  40 mEq Oral Once  . sodium chloride flush  10-40 mL Intracatheter Q12H   Continuous Infusions: . heparin 2,700 Units/hr (05/27/20 0724)  . lactated ringers Stopped (05/26/20 0855)     LOS: 4 days    Time spent: 35 minutes.     Kellye Mizner A Donica Derouin,  MD [facility]   If 7PM-7AM, please contact night-coverage [email or web address]  05/27/2020, 7:34 AM

## 2020-05-27 NOTE — Progress Notes (Addendum)
ANTICOAGULATION CONSULT NOTE   Pharmacy Consult for Heparin Indication: Submassive pulmonary embolus  Labs: Recent Labs    05/25/20 0034 05/25/20 1114 05/26/20 0413 05/27/20 0450  HGB 9.0*  --  8.1* 8.7*  HCT 28.8*  --  25.9* 27.0*  PLT 331  --  277 297  HEPARINUNFRC 0.30 0.38 0.31 0.29*  CREATININE 1.13  --  1.14 1.23   Assessment: 20 yo M presents with LLE DVT + submassive PE with R heart strain (RV/LV ratio 1.7) new EF 25% and pericardial effusion. At risk for hemorrhagic transformation therefore no lytic used.    Heparin level slightly subtherapeutic at 0.29 after rate increase to 2550 units/hr yesterday. No bleeding or issues with infusion overnight per RN. H/H slightly improved, plt wnl.   Goal of Therapy:  Heparin level 0.3-0.7 units/ml Monitor platelets by anticoagulation protocol: Yes   Plan:  Increase heparin infusion 2700 units/hr 6h heparin level Monitor daily HL, CBC, s/sx bleeding  Addendum: 2/27 PM Heparin level now therapeutic at 0.39 after rate increase to 2700 units/hr this morning. No bleeding noted. Follow up AM heparin level.   Pervis Hocking, PharmD PGY1 Pharmacy Resident 05/27/2020 2:25 PM  Please check [email or web address] for unit-specific pharmacy phone numbers.

## 2020-05-28 ENCOUNTER — Inpatient Hospital Stay (HOSPITAL_COMMUNITY): Payer: BC Managed Care – PPO

## 2020-05-28 DIAGNOSIS — I5021 Acute systolic (congestive) heart failure: Secondary | ICD-10-CM

## 2020-05-28 DIAGNOSIS — I313 Pericardial effusion (noninflammatory): Secondary | ICD-10-CM

## 2020-05-28 LAB — ECHOCARDIOGRAM LIMITED
Height: 75 in
S' Lateral: 4.5 cm
Weight: 2874.8 oz

## 2020-05-28 LAB — FERRITIN: Ferritin: 105 ng/mL (ref 24–336)

## 2020-05-28 LAB — RETICULOCYTES
Immature Retic Fract: 24.8 % — ABNORMAL HIGH (ref 2.3–15.9)
RBC.: 3.9 MIL/uL — ABNORMAL LOW (ref 4.22–5.81)
Retic Count, Absolute: 61.6 10*3/uL (ref 19.0–186.0)
Retic Ct Pct: 1.6 % (ref 0.4–3.1)

## 2020-05-28 LAB — BASIC METABOLIC PANEL
Anion gap: 6 (ref 5–15)
BUN: 10 mg/dL (ref 6–20)
CO2: 25 mmol/L (ref 22–32)
Calcium: 7.1 mg/dL — ABNORMAL LOW (ref 8.9–10.3)
Chloride: 102 mmol/L (ref 98–111)
Creatinine, Ser: 1.03 mg/dL (ref 0.61–1.24)
GFR, Estimated: >60 mL/min (ref >60)
Glucose, Bld: 86 mg/dL (ref 70–99)
Potassium: 4.2 mmol/L (ref 3.5–5.1)
Sodium: 133 mmol/L — ABNORMAL LOW (ref 135–145)

## 2020-05-28 LAB — FOLATE: Folate: 9 ng/mL (ref >5.9)

## 2020-05-28 LAB — CBC
HCT: 26.6 % — ABNORMAL LOW (ref 39.0–52.0)
Hemoglobin: 8.3 g/dL — ABNORMAL LOW (ref 13.0–17.0)
MCH: 21.4 pg — ABNORMAL LOW (ref 26.0–34.0)
MCHC: 31.2 g/dL (ref 30.0–36.0)
MCV: 68.7 fL — ABNORMAL LOW (ref 80.0–100.0)
Platelets: 280 10*3/uL (ref 150–400)
RBC: 3.87 MIL/uL — ABNORMAL LOW (ref 4.22–5.81)
RDW: 18.7 % — ABNORMAL HIGH (ref 11.5–15.5)
WBC: 4.4 10*3/uL (ref 4.0–10.5)
nRBC: 0 % (ref 0.0–0.2)

## 2020-05-28 LAB — IRON AND TIBC
Iron: 13 ug/dL — ABNORMAL LOW (ref 45–182)
Saturation Ratios: 12 % — ABNORMAL LOW (ref 17.9–39.5)
TIBC: 109 ug/dL — ABNORMAL LOW (ref 250–450)
UIBC: 96 ug/dL

## 2020-05-28 LAB — VITAMIN B12: Vitamin B-12: 261 pg/mL (ref 180–914)

## 2020-05-28 LAB — HEPARIN LEVEL (UNFRACTIONATED): Heparin Unfractionated: 0.29 IU/mL — ABNORMAL LOW (ref 0.30–0.70)

## 2020-05-28 MED ORDER — APIXABAN 5 MG PO TABS: 10.0000 mg | ORAL_TABLET | Freq: Two times a day (BID) | ORAL | Status: DC

## 2020-05-28 MED ORDER — APIXABAN 5 MG PO TABS
10.0000 mg | ORAL_TABLET | Freq: Two times a day (BID) | ORAL | Status: DC
  Administered 2020-05-28 – 2020-05-29 (×3): 10 mg via ORAL
  Filled 2020-05-28 (×3): qty 2

## 2020-05-28 MED ORDER — APIXABAN 5 MG PO TABS: 5.0000 mg | ORAL_TABLET | Freq: Two times a day (BID) | ORAL | Status: DC

## 2020-05-28 MED ORDER — SODIUM CHLORIDE 0.9 % IV SOLN
510.0000 mg | Freq: Once | INTRAVENOUS | Status: AC
  Administered 2020-05-28: 510 mg via INTRAVENOUS
  Filled 2020-05-28: qty 17

## 2020-05-28 MED ORDER — FERROUS SULFATE 325 (65 FE) MG PO TABS
325.0000 mg | ORAL_TABLET | Freq: Every day | ORAL | Status: DC
  Administered 2020-05-29: 325 mg via ORAL
  Filled 2020-05-28 (×2): qty 1

## 2020-05-28 MED ORDER — GADOBUTROL 1 MMOL/ML IV SOLN
10.0000 mL | Freq: Once | INTRAVENOUS | Status: AC | PRN
  Administered 2020-05-28: 10 mL via INTRAVENOUS

## 2020-05-28 MED ORDER — IVABRADINE HCL 5 MG PO TABS
5.0000 mg | ORAL_TABLET | Freq: Two times a day (BID) | ORAL | Status: DC
  Administered 2020-05-28 – 2020-05-29 (×3): 5 mg via ORAL
  Filled 2020-05-28 (×4): qty 1

## 2020-05-28 MED ORDER — VITAMIN B-12 100 MCG PO TABS
100.0000 ug | ORAL_TABLET | Freq: Every day | ORAL | Status: DC
  Administered 2020-05-28 – 2020-05-29 (×2): 100 ug via ORAL
  Filled 2020-05-28 (×2): qty 1

## 2020-05-28 NOTE — Progress Notes (Addendum)
Advanced Heart Failure Rounding Note  PCP-Cardiologist: No primary care provider on file.   Subjective:    Overall feeling better. No dyspnea. O2 sats stable on RA. Able to sleep flat w/o orthopnea/PND. No pleuritic chest pain. Continues in sinus tach 110s at rest.   CVP 5-6.   Hbg low at 8.3 Anemia panel c/w IDA  Reports significant unintentional wt loss over the last year ~70 lb.     Objective:   Weight Range: 81.5 kg Body mass index is 22.46 kg/m.   Vital Signs:   Temp:  [97.9 F (36.6 C)-99 F (37.2 C)] 99 F (37.2 C) (02/28 0738) Pulse Rate:  [103-115] 103 (02/28 0738) Resp:  [16-18] 16 (02/28 0738) BP: (86-107)/(55-65) 103/59 (02/28 0738) SpO2:  [99 %-100 %] 99 % (02/28 0738) Weight:  [80.2 kg-81.5 kg] 81.5 kg (02/28 0500) Last BM Date: 05/27/20  Weight change: Filed Weights   05/26/20 0427 05/27/20 0847 05/28/20 0500  Weight: 80.4 kg 80.2 kg 81.5 kg    Intake/Output:   Intake/Output Summary (Last 24 hours) at 05/28/2020 0757 Last data filed at 05/28/2020 0600 Gross per 24 hour  Intake 1133.32 ml  Output --  Net 1133.32 ml      Physical Exam    CVP 5-6  General: pale appearing young male. No resp difficulty HEENT: Normal Neck: Supple. JVP . Carotids 2+ bilat; no bruits. No lymphadenopathy or thyromegaly appreciated. Cor: PMI nondisplaced. Regular rhythm, tachy rate. No rubs, gallops or murmurs. Lungs: Clear Abdomen: Soft, nontender, nondistended. No hepatosplenomegaly. No bruits or masses. Good bowel sounds. Extremities: No cyanosis, clubbing, rash, edema Neuro: Alert & orientedx3, cranial nerves grossly intact. moves all 4 extremities w/o difficulty. Affect pleasant   Telemetry   Sinus tach 110s  EKG    No new EKG to review today   Labs    CBC Recent Labs    05/27/20 0450 05/28/20 0522  WBC 5.1 4.4  HGB 8.7* 8.3*  HCT 27.0* 26.6*  MCV 67.5* 68.7*  PLT 297 280   Basic Metabolic Panel Recent Labs    16/10/96 0450  05/28/20 0522  NA 134* 133*  K 3.9 4.2  CL 102 102  CO2 25 25  GLUCOSE 115* 86  BUN 11 10  CREATININE 1.23 1.03  CALCIUM 7.0* 7.1*   Liver Function Tests No results for input(s): AST, ALT, ALKPHOS, BILITOT, PROT, ALBUMIN in the last 72 hours. No results for input(s): LIPASE, AMYLASE in the last 72 hours. Cardiac Enzymes No results for input(s): CKTOTAL, CKMB, CKMBINDEX, TROPONINI in the last 72 hours.  BNP: BNP (last 3 results) Recent Labs    05/23/20 1307  BNP 355.4*    ProBNP (last 3 results) No results for input(s): PROBNP in the last 8760 hours.   D-Dimer No results for input(s): DDIMER in the last 72 hours. Hemoglobin A1C No results for input(s): HGBA1C in the last 72 hours. Fasting Lipid Panel No results for input(s): CHOL, HDL, LDLCALC, TRIG, CHOLHDL, LDLDIRECT in the last 72 hours. Thyroid Function Tests No results for input(s): TSH, T4TOTAL, T3FREE, THYROIDAB in the last 72 hours.  Invalid input(s): FREET3  Other results:   Imaging     No results found.   Medications:     Scheduled Medications: . Chlorhexidine Gluconate Cloth  6 each Topical Daily  . influenza vac split quadrivalent PF  0.5 mL Intramuscular Tomorrow-1000  . losartan  25 mg Oral Daily  . Mesalamine  400 mg Oral Daily  . pantoprazole  40 mg Oral Daily  . potassium chloride  40 mEq Oral Once  . sodium chloride flush  10-40 mL Intracatheter Q12H     Infusions: . heparin 2,700 Units/hr (05/28/20 0504)     PRN Medications:  sodium chloride flush    Assessment/Plan    1. Acute submassive PE: Bilateral PEs on CTA, possible component of pulmonary infarction.  DVT in left leg.  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  Has had recent Crohns flare, ?related to inflammation from Crohns.   - Continue heparin gtt, eventually transition to DOAC.  - He is doing better symptomatically today and is on  room air.  No plans for thrombolysis given clinical improvement and moderate pericardial effusion with some risk for hemorrhagic conversion.  2. Pericardial effusion: Moderate on echo 2/25 without tamponade.  Cause uncertain, ?related to inflammation from Crohns, ?due to large PE.   - Monitor for now. Will repeat echo today to make sure not progressing.  3. Acute on chronic biventricular failure: He has severe RV dysfunction with McConnell's sign and moderate RV enlargement, this is from the large PE.  However, he also has decreased LV systolic function, EF appears to be around 30%.  ?Pre-existing cardiomyopathy, perhaps viral myocarditis-related from prior COVID-19 infection.  However, the pattern of wall motion abnormality with relatively preserved apical function and more hypokinetic towards the base raises concern for stress cardiomyopathy variant (reverse Takotsubo).  - Volume status improved w/ diuresis. CVP 5-6 today  - Continue low dose losartan 25 mg daily. BP too soft for Entresto  - Consider digoxin +/- ivabradine for rate control if BP remains too soft for  blocker - He will plan cardiac MRI to evaluate cardiomyopathy  4. Crohns disease: Recent flare symptomatically.  He is on mesalamine.  5. Anemia: Hgb 8.3. Anemia panel c/w IDA - ferrous sulfate has been added  - would benefit from feraheme but would wait to give until after his cMRI as adminstration prior to study can affect imaging  - Given overall clinical picture of unprovoked DVT/PE, pericardial effusion, 70+ lb unintentional wt loss, unexplained IDA in a young male, Crohn's disease w/ higher risk for developing colorectal cancer, consider w/u to exclude malignancy.   Length of Stay: [address] PA-C  05/28/2020, 7:57 AM  Advanced Heart Failure Team Pager [phone] (M-F; 7a - 4p)  Please contact [facility] for night-coverage after hours (4p -7a ) and weekends on [email or web address]  Patient seen with PA, agree with the  above note.   Feeling better as time goes on.  Walked in hall.  Still ST around 110, SBP 90s-110s.  Fe deficiency anemia noted. Still on IV heparin. CVP about 6.   General: NAD Neck: No JVD, no thyromegaly or thyroid nodule.  Lungs: Clear to auscultation bilaterally with normal respiratory effort. CV: Nondisplaced PMI.  Heart tachy, regular S1/S2, no S3/S4, no murmur.  No peripheral edema.   Abdomen: Soft, nontender, no hepatosplenomegaly, no distention.  Skin: Intact without lesions or rashes.  Neurologic: Alert and oriented x 3.  Psych: Normal affect. Extremities: No clubbing or cyanosis.  HEENT: Normal.   Limited echo today.  If pericardial effusion is not larger, will stop heparin gtt and start apixaban.   Stable without overt CHF (CVP 6). Last co-ox 60%.  As above, has RV dysfunction by echo explained by large PE.  LV dysfunction is not explained.  Pattern of  wall motion suggests possibility of reverse Takotsubo. BP too low to titrate BP-active meds.  - Cardiac MRI today.  - Continue losartan 25 mg daily.  - Add ivabradine 5 mg bid.    Marca Ancona 05/28/2020 9:38 AM

## 2020-05-28 NOTE — Progress Notes (Signed)
PROGRESS NOTE    STELIOS MCGHIE III  WUJ:[id number] DOB: 07-18-2000 DOA: 05/23/2020 PCP: Ihor Gully, MD   Brief Narrative: 20 year old with past medical history significant for Crohn's, asthma who presents with 5 days history of shortness of breath/exertional dyspnea, found to have bilateral pulmonary emboli with c/f Right heart strain and acute left lower extremity DVT in the setting of recent Covid infection and  Crohn's disease.  Patient was a started on IV heparin drip, he was also found to have biventricular heart failure.  Heart failure team consulted and following.  Assessment & Plan:   Active Problems:   Pulmonary emboli (HCC)   Crohn's disease (HCC)   Pericardial effusion   Deep vein thrombosis (DVT) of left lower extremity (HCC)   Acute systolic CHF (congestive heart failure) (HCC)  1-Acute Bilateral Pulmonary Embolism, Acute left lower extremity DVT: Right heart  Strain: -Plan to continue with IV heparin for now.  Plan to repeat echocardiogram to evaluate pericardial effusion 2/28. -Ultimately plan is to transition to DOAC. -Vitals continue to be stable.  -He was found not to be a candidate for thrombolytic therapy due to pericardial effusion and risk for hemorrhagic transformation. -plan to transition to Elquis, pericardial effusion Mild by ECHO.    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  -Plan to start Cozaar 05/27/2020. -He will need a cardiac MRI to further evaluate for myocarditis or infiltrative disease. -ECHO Ef 25 %, mild pericardial effusion.  -received two doses of lasix.   3-Crohn's disease: Initially diagnosed in 2019, history of frequent flares. On admission he was having some diarrhea and abdominal pain but has since resolved Continue with mesalamine. I have arrange follow up with Dr Ewing Schlein.    4-Hyponatremia: Mild improved  Anemia: Stable 8.7 today. Iron deficiency. Started on oral iron.  Started B 12 supplement.     Estimated body mass index is 22.46 kg/m as calculated from the following:   Height as of this encounter: 6\' 3"  (1.905 m).   Weight as of this encounter: 81.5 kg.   DVT prophylaxis:  Code Status: Full code Family Communication: Care discussed with patient Disposition Plan:  Status is: Inpatient  Remains inpatient appropriate because:IV treatments appropriate due to intensity of illness or inability to take PO   Dispo: The patient is from: Home              Anticipated d/c is to: Home              Patient currently is not medically stable to d/c.   Difficult to place patient No        Consultants:   Cardiology  CMM admitted patient  Procedures:  Echo:  Antimicrobials:    Subjective: Denies chest pain or dyspnea.   Objective: Vitals:   05/28/20 0000 05/28/20 0012 05/28/20 0500 05/28/20 0738  BP: 107/62 94/65 (!) 99/55 (!) 103/59  Pulse: (!) 115 (!) 105 (!) 108 (!) 103  Resp: 18 18 18 16   Temp: 98 F (36.7 C) 98.1 F (36.7 C) 98.1 F (36.7 C) 99 F (37.2 C)  TempSrc: Oral Oral Oral Oral  SpO2: 100% 100% 100% 99%  Weight:   81.5 kg   Height:        Intake/Output Summary (Last 24 hours) at 05/28/2020 1516 Last data filed at 05/28/2020 0600 Gross per 24 hour  Intake 1113.32 ml  Output --  Net 1113.32 ml   Filed Weights   05/26/20 0427 05/27/20 0847 05/28/20 0500  Weight: 80.4 kg 80.2 kg 81.5 kg    Examination:  General exam: NAD Respiratory system: CTA Cardiovascular system: S 1, S 2 RRR Gastrointestinal system: BS present, soft , nt Central nervous system: alert Extremities: Symmetric 5 x 5 power. Left with edema   Data Reviewed: I have personally reviewed following labs and imaging studies  CBC: Recent Labs  Lab 05/24/20 0442 05/25/20 0034 05/26/20 0413 05/27/20 0450 05/28/20 0522  WBC 6.4 4.9 5.3  5.1 4.4  HGB 9.7* 9.0* 8.1* 8.7* 8.3*  HCT 31.0* 28.8* 25.9* 27.0* 26.6*  MCV 68.0* 67.9* 68.5* 67.5* 68.7*  PLT 363 331 277 297 280   Basic Metabolic Panel: Recent Labs  Lab 05/24/20 0442 05/25/20 0034 05/26/20 0413 05/27/20 0450 05/28/20 0522  NA 134* 133* 133* 134* 133*  K 4.2 4.1 4.2 3.9 4.2  CL 101 103 101 102 102  CO2 25 22 25 25 25   GLUCOSE 102* 92 84 115* 86  BUN 10 10 10 11 10   CREATININE 1.16 1.13 1.14 1.23 1.03  CALCIUM 7.6* 7.0* 7.1* 7.0* 7.1*  MG 1.7  --   --   --   --   PHOS 3.9  --   --   --   --    GFR: Estimated Creatinine Clearance: 130.8 mL/min (by C-G formula based on SCr of 1.03 mg/dL). Liver Function Tests: Recent Labs  Lab 05/23/20 1017  AST 11*  ALT 11  ALKPHOS 101  BILITOT 0.4  PROT 6.1*  ALBUMIN 1.6*   Recent Labs  Lab 05/23/20 1017  LIPASE 18   No results for input(s): AMMONIA in the last 168 hours. Coagulation Profile: No results for input(s): INR, PROTIME in the last 168 hours. Cardiac Enzymes: No results for input(s): CKTOTAL, CKMB, CKMBINDEX, TROPONINI in the last 168 hours. BNP (last 3 results) No results for input(s): PROBNP in the last 8760 hours. HbA1C: No results for input(s): HGBA1C in the last 72 hours. CBG: No results for input(s): GLUCAP in the last 168 hours. Lipid Profile: No results for input(s): CHOL, HDL, LDLCALC, TRIG, CHOLHDL, LDLDIRECT in the last 72 hours. Thyroid Function Tests: No results for input(s): TSH, T4TOTAL, FREET4, T3FREE, THYROIDAB in the last 72 hours. Anemia Panel: Recent Labs    05/28/20 0522  VITAMINB12 261  FOLATE 9.0  FERRITIN 105  TIBC 109*  IRON 13*  RETICCTPCT 1.6   Sepsis Labs: No results for input(s): PROCALCITON, LATICACIDVEN in the last 168 hours.  Recent Results (from the past 240 hour(s))  Resp Panel by RT-PCR (Flu A&B, Covid) Nasopharyngeal Swab     Status: None   Collection Time: 05/23/20  2:58 PM   Specimen: Nasopharyngeal Swab; Nasopharyngeal(NP) swabs in vial  transport medium  Result Value Ref Range Status   SARS Coronavirus 2 by RT PCR NEGATIVE NEGATIVE Final    Comment: (NOTE) SARS-CoV-2 target nucleic acids are NOT DETECTED.  The SARS-CoV-2 RNA is generally detectable in upper respiratory specimens during the acute phase of infection. The lowest concentration of SARS-CoV-2 viral copies this assay can detect is 138 copies/mL. A negative result does not preclude SARS-Cov-2 infection and should not be used as the sole basis for treatment or other patient management decisions. A negative result may occur with  improper specimen collection/handling, submission of specimen other than nasopharyngeal swab, presence of viral mutation(s) within the areas targeted by this assay, and inadequate number of viral copies(<138  copies/mL). A negative result must be combined with clinical observations, patient history, and epidemiological information. The expected result is Negative.  Fact Sheet for Patients:  BloggerCourse.com  Fact Sheet for Healthcare Providers:  SeriousBroker.it  This test is no t yet approved or cleared by the [place] FDA and  has been authorized for detection and/or diagnosis of SARS-CoV-2 by FDA under an Emergency Use Authorization (EUA). This EUA will remain  in effect (meaning this test can be used) for the duration of the COVID-19 declaration under Section 564(b)(1) of the Act, 21 U.S.C.section 360bbb-3(b)(1), unless the authorization is terminated  or revoked sooner.       Influenza A by PCR NEGATIVE NEGATIVE Final   Influenza B by PCR NEGATIVE NEGATIVE Final    Comment: (NOTE) The Xpert Xpress SARS-CoV-2/FLU/RSV plus assay is intended as an aid in the diagnosis of influenza from Nasopharyngeal swab specimens and should not be used as a sole basis for treatment. Nasal washings and aspirates are unacceptable for Xpert Xpress SARS-CoV-2/FLU/RSV testing.  Fact  Sheet for Patients: BloggerCourse.com  Fact Sheet for Healthcare Providers: SeriousBroker.it  This test is not yet approved or cleared by the [place] FDA and has been authorized for detection and/or diagnosis of SARS-CoV-2 by FDA under an Emergency Use Authorization (EUA). This EUA will remain in effect (meaning this test can be used) for the duration of the COVID-19 declaration under Section 564(b)(1) of the Act, 21 U.S.C. section 360bbb-3(b)(1), unless the authorization is terminated or revoked.  Performed at [facility], [address], [place], [place] [address]   MRSA PCR Screening     Status: None   Collection Time: 05/23/20 11:37 PM   Specimen: Nasopharyngeal  Result Value Ref Range Status   MRSA by PCR NEGATIVE NEGATIVE Final    Comment:        The GeneXpert MRSA Assay (FDA approved for NASAL specimens only), is one component of a comprehensive MRSA colonization surveillance program. It is not intended to diagnose MRSA infection nor to guide or monitor treatment for MRSA infections. Performed at [facility], [address], [place], [place] [address]          Radiology Studies: MR CARDIAC MORPHOLOGY W WO CONTRAST  Result Date: 05/28/2020 CLINICAL DATA:  Cardiomyopathy of uncertain etiology EXAM: CARDIAC MRI TECHNIQUE: The patient was scanned on a 1.5 Tesla GE magnet. A dedicated cardiac coil was used. Functional imaging was done using Fiesta sequences. 2,3, and 4 chamber views were done to assess for RWMA's. Modified Simpson's rule using a short axis stack was used to calculate an ejection fraction on a dedicated work [employer]. The patient received 8 cc of Gadavist. After 10 minutes inversion recovery sequences were used to assess for infiltration and scar tissue. FINDINGS: Limited images of the lung fields showed no gross abnormalities. There is a moderate pericardial effusion  noted, primarily apically. No evidence for tamponade. Normal left ventricular size and wall thickness. EF 37%, there is diffuse hypokinesis worse towards the LV base and less towards the LV apex. The right ventricle is moderate to severely dilated with severe systolic dysfunction, EF 20%. Relative preservation of function at RV apex. Normal left and right atrial sizes. No significant mitral regurgitation. Trileaflet aortic valve with no stenosis or regurgitation. Moderate tricuspid regurgitation. On delayed enhancement imaging, no myocardial late gadolinium enhancement (LGE) was noted. Measurements: LVEDV 162 mL LVSV 60 mL LVEF 37% RVEDV 305 mL RVSV 60 mL RVEF 20%  IMPRESSION: 1. Normal LV size with EF 37%, diffuse hypokinesis worse towards the base and less towards the apex. ?Reverse Takotsubo pattern. 2. Moderate to severely dilated RV with severe systolic dysfunction, EF 20%. RV function preserved at the apex. 3. No myocardial LGE, so no definitive evidence for prior MI, infiltrative disease, or myocarditis. 4. Moderate pericardial effusion primarily at the apex, no tamponade. Dalton Mclean Electronically Signed   By: Marca Ancona M.D.   On: 05/28/2020 14:17   ECHOCARDIOGRAM LIMITED  Result Date: 05/28/2020    ECHOCARDIOGRAM LIMITED REPORT   Patient Name:   DORAL WENHOLD III Date of Exam: 05/28/2020 Medical Rec #:  [id number]          Height:       75.0 in Accession #:    [id number]         Weight:       179.7 lb Date of Birth:  12/28/2000          BSA:          2.097 m Patient Age:    20 years           BP:           103/59 mmHg Patient Gender: M                  HR:           110 bpm. Exam Location:  Inpatient Procedure: Cardiac Doppler, Limited Color Doppler and Limited Echo Indications:    I31.3 Pericardial effusion  History:        Patient has prior history of Echocardiogram examinations, most                 recent 05/25/2020. Sickle Cell Trait. Pulmonary Embolism.  Sonographer:    Elmarie Shiley Dance  Referring Phys: [id number] A Trayce Maino IMPRESSIONS  1. Since the last study on 05/25/2020 pericardial effusion has improved, now mild.  2. Left ventricular ejection fraction, by estimation, is 25 to 30%. The left ventricle has severely decreased function. The left ventricle demonstrates global hypokinesis.  3. Right ventricular systolic function is moderately reduced. The right ventricular size is moderately enlarged. There is normal pulmonary artery systolic pressure. The estimated right ventricular systolic pressure is 32.4 mmHg.  4. Left atrial size was mildly dilated.  5. A small pericardial effusion is present. The pericardial effusion is circumferential. There is no evidence of cardiac tamponade.  6. Mild mitral valve regurgitation.  7. Tricuspid valve regurgitation is moderate.  8. Aortic valve regurgitation is trivial. FINDINGS  Left Ventricle: Left ventricular ejection fraction, by estimation, is 25 to 30%. The left ventricle has severely decreased function. The left ventricle demonstrates global hypokinesis. Right Ventricle: The right ventricular size is moderately enlarged. Right ventricular systolic function is moderately reduced. There is normal pulmonary artery systolic pressure. The tricuspid regurgitant velocity is 2.71 m/s, and with an assumed right atrial pressure of 3 mmHg, the estimated right ventricular systolic pressure is 32.4 mmHg. Left Atrium: Left atrial size was mildly dilated. Pericardium: A small pericardial effusion is present. The pericardial effusion is circumferential. There is no evidence of cardiac tamponade. Mitral Valve: Mild mitral valve regurgitation. Tricuspid Valve: Tricuspid valve regurgitation is moderate. Aortic Valve: Aortic valve regurgitation is trivial. LEFT VENTRICLE PLAX 2D LVIDd:         5.20 cm LVIDs:         4.50 cm LV PW:         1.00 cm LV  IVS:        0.80 cm LVOT diam:     2.20 cm LV SV:         44 LV SV Index:   21 LVOT Area:     3.80 cm  IVC IVC diam: 1.80 cm  LEFT ATRIUM         Index LA diam:    4.00 cm 1.91 cm/m  AORTIC VALVE LVOT Vmax:   65.80 cm/s LVOT Vmean:  44.100 cm/s LVOT VTI:    0.117 m  AORTA Ao Root diam: 3.00 cm Ao Asc diam:  2.50 cm TRICUSPID VALVE TR Peak grad:   29.4 mmHg TR Vmax:        271.00 cm/s  SHUNTS Systemic VTI:  0.12 m Systemic Diam: 2.20 cm Tobias Alexander MD Electronically signed by Tobias Alexander MD Signature Date/Time: 05/28/2020/12:49:20 PM    Final         Scheduled Meds: . apixaban  10 mg Oral BID   Followed by  . [START ON 05/30/2020] apixaban  5 mg Oral BID  . Chlorhexidine Gluconate Cloth  6 each Topical Daily  . [START ON 05/29/2020] ferrous sulfate  325 mg Oral Q breakfast  . influenza vac split quadrivalent PF  0.5 mL Intramuscular Tomorrow-1000  . ivabradine  5 mg Oral BID WC  . losartan  25 mg Oral Daily  . Mesalamine  400 mg Oral Daily  . pantoprazole  40 mg Oral Daily  . potassium chloride  40 mEq Oral Once  . sodium chloride flush  10-40 mL Intracatheter Q12H  . vitamin B-12  100 mcg Oral Daily   Continuous Infusions: . ferumoxytol       LOS: 5 days    Time spent: 35 minutes.     Alba Cory, MD [facility]   If 7PM-7AM, please contact night-coverage [email or web address]  05/28/2020, 3:16 PM

## 2020-05-28 NOTE — TOC Progression Note (Signed)
Transition of Care [facility]) - Progression Note    Patient Details  Name: NICKLAS MCSWEENEY MRN: [id number] Date of Birth: [birth year]  Transition of Care [facility]) CM/SW Contact  Huston Foley Jacklynn Ganong, RN Phone Number: 05/28/2020, 4:16 PM  Clinical Narrative:  Case manager submitted request for Benefits check for Eliquis. CM will follow for cost.        Expected Discharge Plan and Services                                                 Social Determinants of Health (SDOH) Interventions    Readmission Risk Interventions No flowsheet data found.

## 2020-05-28 NOTE — Plan of Care (Signed)

## 2020-05-28 NOTE — Progress Notes (Signed)
  Echocardiogram 2D Echocardiogram has been performed.  Glenn [name] 05/28/2020, 12:34 PM

## 2020-05-28 NOTE — Progress Notes (Signed)
Transitions of Care Pharmacist Note  Glenn [name] is a 20 y.o. male that has been diagnosed with PE and LLE DVT and will be prescribed Eliquis (apixaban) at discharge.   Patient Education: I provided the following education on Eliquis to the patient: How to take the medication Described what the medication is Signs of bleeding Signs/symptoms of VTE and stroke  Answered their questions  Discharge Medications Plan: The patient wants to have their discharge medications filled by the Transitions of Care pharmacy rather than their usual pharmacy.  The discharge orders pharmacy has been changed to the Transitions of Care pharmacy, the patient will receive a phone call regarding co-pay, and their medications will be delivered by the Transitions of Care pharmacy.    Thank you,   Laverna Peace, PharmD PGY-1 Pharmacy Resident May 28, 2020

## 2020-05-28 NOTE — Progress Notes (Signed)
Progress Note  Patient Name: Glenn [name] Date of Encounter: 05/28/2020  [facility] Cardiologist: Dr Jacques Navy  Subjective   Denies CP or dyspnea  Inpatient Medications    Scheduled Meds: . Chlorhexidine Gluconate Cloth  6 each Topical Daily  . [START ON 05/29/2020] ferrous sulfate  325 mg Oral Q breakfast  . influenza vac split quadrivalent PF  0.5 mL Intramuscular Tomorrow-1000  . losartan  25 mg Oral Daily  . Mesalamine  400 mg Oral Daily  . pantoprazole  40 mg Oral Daily  . potassium chloride  40 mEq Oral Once  . sodium chloride flush  10-40 mL Intracatheter Q12H  . vitamin B-12  100 mcg Oral Daily   Continuous Infusions: . heparin 2,700 Units/hr (05/28/20 0504)   PRN Meds: sodium chloride flush   Vital Signs    Vitals:   05/28/20 0000 05/28/20 0012 05/28/20 0500 05/28/20 0738  BP: 107/62 94/65 (!) 99/55 (!) 103/59  Pulse: (!) 115 (!) 105 (!) 108 (!) 103  Resp: 18 18 18 16   Temp: 98 F (36.7 C) 98.1 F (36.7 C) 98.1 F (36.7 C) 99 F (37.2 C)  TempSrc: Oral Oral Oral Oral  SpO2: 100% 100% 100% 99%  Weight:   81.5 kg   Height:        Intake/Output Summary (Last 24 hours) at 05/28/2020 0818 Last data filed at 05/28/2020 0600 Gross per 24 hour  Intake 1133.32 ml  Output -  Net 1133.32 ml   Last 3 Weights 05/28/2020 05/27/2020 05/26/2020  Weight (lbs) 179 lb 10.8 oz 176 lb 12.8 oz 177 lb 4 oz  Weight (kg) 81.5 kg 80.196 kg 80.4 kg      Telemetry    Sinus tachycardia- Personally Reviewed   Physical Exam   GEN: NAD  Neck: supple, no JVD Cardiac: Tachycardic, no murmur Respiratory: CTA; no wheeze GI: Soft, NT/ND, no masses MS: no edema Neuro:  No focal findings Psych: Normal affect   Labs    High Sensitivity Troponin:   Recent Labs  Lab 05/23/20 1458 05/23/20 2211  TROPONINIHS 11 12      Chemistry Recent Labs  Lab 05/23/20 1017 05/24/20 0442 05/26/20 0413 05/27/20 0450 05/28/20 0522  NA 133*   < > 133* 134* 133*  K 4.2    < > 4.2 3.9 4.2  CL 100   < > 101 102 102  CO2 23   < > 25 25 25   GLUCOSE 110*   < > 84 115* 86  BUN 10   < > 10 11 10   CREATININE 1.20   < > 1.14 1.23 1.03  CALCIUM 7.8*   < > 7.1* 7.0* 7.1*  PROT 6.1*  --   --   --   --   ALBUMIN 1.6*  --   --   --   --   AST 11*  --   --   --   --   ALT 11  --   --   --   --   ALKPHOS 101  --   --   --   --   BILITOT 0.4  --   --   --   --   GFRNONAA >60   < > >60 >60 >60  ANIONGAP 10   < > 7 7 6    < > = values in this interval not displayed.     Hematology Recent Labs  Lab 05/26/20 0413 05/27/20 0450 05/28/20 0522  WBC  5.3 5.1 4.4  RBC 3.78* 4.00* 3.87*  3.90*  HGB 8.1* 8.7* 8.3*  HCT 25.9* 27.0* 26.6*  MCV 68.5* 67.5* 68.7*  MCH 21.4* 21.8* 21.4*  MCHC 31.3 32.2 31.2  RDW 18.8* 18.7* 18.7*  PLT 277 297 280    BNP Recent Labs  Lab 05/23/20 1307  BNP 355.4*     Radiology    No results found.  Patient Profile     20 y.o. male with past medical history of Crohn's, asthma and migraines admitted with pulmonary embolus, cardiomyopathy and pericardial effusion.  Assessment & Plan    1 status post pulmonary embolus-patient symptoms have resolved.  As outlined previously he was not given thrombolytic therapy due to pericardial effusion and risk of hemorrhagic transformation.  We will continue IV heparin.  Repeat echocardiogram today to reassess pericardial fusion.  If unchanged will transition heparin to apixaban.    2 cardiomyopathy-symptoms have resolved.  We will continue low-dose losartan.  I cannot advance or add beta-blockade due to borderline blood pressure.  This can be considered as an outpatient.  Etiology of cardiomyopathy unclear.  Unlikely to be ischemic given patient's age.  No history of alcohol abuse.  Question stress-induced cardiomyopathy.  We will arrange cardiac MRI today to exclude myocarditis.  Patient will need follow-up in CHF clinic to titrate medications as tolerated.    3 pericardial effusion-plan  follow-up echocardiogram to reassess.  If unchanged will begin anticoagulation.  4 Crohn's disease-continue mesalamine.  Remains anemic.  Follow hemoglobin.  Possible discharge later today pending results of echocardiogram and cardiac MRI.  For questions or updates, please contact [facility] Please consult [email or web address] for contact info under        Signed, Olga Millers, MD  05/28/2020, 8:18 AM

## 2020-05-28 NOTE — Discharge Instructions (Signed)
Information on my medicine - ELIQUIS (apixaban)  This medication education was reviewed with me or my healthcare representative as part of my discharge preparation.    Why was Eliquis prescribed for you? Eliquis was prescribed to treat blood clots that may have been found in the veins of your legs (deep vein thrombosis) or in your lungs (pulmonary embolism) and to reduce the risk of them occurring again.  What do You need to know about Eliquis ? The starting dose is 10 mg (two 5 mg tablets) taken twice daily, then on 05/30/20 the dose is reduced to ONE 5 mg tablet taken TWICE daily.  Eliquis may be taken with or without food.   Try to take the dose about the same time in the morning and in the evening. If you have difficulty swallowing the tablet whole please discuss with your pharmacist how to take the medication safely.  Take Eliquis exactly as prescribed and DO NOT stop taking Eliquis without talking to the doctor who prescribed the medication.  Stopping may increase your risk of developing a new blood clot.  Refill your prescription before you run out.  After discharge, you should have regular check-up appointments with your healthcare provider that is prescribing your Eliquis.    What do you do if you miss a dose? If a dose of ELIQUIS is not taken at the scheduled time, take it as soon as possible on the same day and twice-daily administration should be resumed. The dose should not be doubled to make up for a missed dose.  Important Safety Information A possible side effect of Eliquis is bleeding. You should call your healthcare provider right away if you experience any of the following: ? Bleeding from an injury or your nose that does not stop. ? Unusual colored urine (red or dark brown) or unusual colored stools (red or black). ? Unusual bruising for unknown reasons. ? A serious fall or if you hit your head (even if there is no bleeding).  Some medicines may interact with  Eliquis and might increase your risk of bleeding or clotting while on Eliquis. To help avoid this, consult your healthcare provider or pharmacist prior to using any new prescription or non-prescription medications, including herbals, vitamins, non-steroidal anti-inflammatory drugs (NSAIDs) and supplements.  This website has more information on Eliquis (apixaban): http://www.eliquis.com/eliquis/home

## 2020-05-28 NOTE — Progress Notes (Signed)
Echocardiogram 2D Echocardiogram has been attempted. Patient not in room. Will reattempt at later time.  Amritpal Shropshire G Montford Barg 05/28/2020, 11:11 AM

## 2020-05-28 NOTE — Progress Notes (Signed)
ANTICOAGULATION CONSULT NOTE   Pharmacy Consult for Heparin Indication: Submassive pulmonary embolus  Labs: Recent Labs    05/26/20 0413 05/27/20 0450 05/27/20 1336 05/28/20 0500 05/28/20 0522  HGB 8.1* 8.7*  --   --  8.3*  HCT 25.9* 27.0*  --   --  26.6*  PLT 277 297  --   --  280  HEPARINUNFRC 0.31 0.29* 0.39 0.29*  --   CREATININE 1.14 1.23  --   --  1.03   Assessment: 20 yo M presents with LLE DVT + submassive PE with R heart strain (RV/LV ratio 1.7) new EF 25% and pericardial effusion. At risk for hemorrhagic transformation therefore no lytic used.    Heparin level slightly subtherapeutic at 0.29, on 2700 units/hr. Hgb 8.3, plt 280. No bleeding or issues with infusion.  Goal of Therapy:  Heparin level 0.3-0.7 units/ml Monitor platelets by anticoagulation protocol: Yes   Plan:  Increase heparin infusion 2750 units/hr 6h heparin level Monitor daily HL, CBC, s/sx bleeding  Sherron Monday, PharmD, BCCCP Clinical Pharmacist  Phone: [phone] 05/28/2020 8:39 AM  Please check AMION for all [facility] Pharmacy phone numbers After 10:00 PM, call Main Pharmacy [phone]  ADDENDUM ECHO showing improved, mild pericardial effusion. Discussed with MD, okay. Discussed with MD and okay to switch to apixaban. Will order apixaban 10 mg BID tonight and transition to 5 mg BID after the load. Hgb stable at 8.3, plt 280. Respiratory status stable.  Sherron Monday, PharmD, BCCCP Clinical Pharmacist

## 2020-05-29 ENCOUNTER — Other Ambulatory Visit: Payer: Self-pay | Admitting: Internal Medicine

## 2020-05-29 LAB — CBC
HCT: 25.9 % — ABNORMAL LOW (ref 39.0–52.0)
Hemoglobin: 8.5 g/dL — ABNORMAL LOW (ref 13.0–17.0)
MCH: 22.1 pg — ABNORMAL LOW (ref 26.0–34.0)
MCHC: 32.8 g/dL (ref 30.0–36.0)
MCV: 67.3 fL — ABNORMAL LOW (ref 80.0–100.0)
Platelets: 272 10*3/uL (ref 150–400)
RBC: 3.85 MIL/uL — ABNORMAL LOW (ref 4.22–5.81)
RDW: 18.7 % — ABNORMAL HIGH (ref 11.5–15.5)
WBC: 4.5 10*3/uL (ref 4.0–10.5)
nRBC: 0 % (ref 0.0–0.2)

## 2020-05-29 LAB — BASIC METABOLIC PANEL
Anion gap: 8 (ref 5–15)
BUN: 11 mg/dL (ref 6–20)
CO2: 24 mmol/L (ref 22–32)
Calcium: 7.4 mg/dL — ABNORMAL LOW (ref 8.9–10.3)
Chloride: 102 mmol/L (ref 98–111)
Creatinine, Ser: 1.15 mg/dL (ref 0.61–1.24)
GFR, Estimated: >60 mL/min (ref >60)
Glucose, Bld: 87 mg/dL (ref 70–99)
Potassium: 4.4 mmol/L (ref 3.5–5.1)
Sodium: 134 mmol/L — ABNORMAL LOW (ref 135–145)

## 2020-05-29 LAB — ANTITHROMBIN III: AntiThromb III Func: 58 % — ABNORMAL LOW (ref 75–120)

## 2020-05-29 MED ORDER — FERROUS SULFATE 325 (65 FE) MG PO TABS: 325.0000 mg | ORAL_TABLET | Freq: Every day | ORAL | 3 refills | Status: DC

## 2020-05-29 MED ORDER — PANTOPRAZOLE SODIUM 40 MG PO TBEC: 40.0000 mg | DELAYED_RELEASE_TABLET | Freq: Every day | ORAL | 0 refills | Status: AC

## 2020-05-29 MED ORDER — ONDANSETRON HCL 4 MG PO TABS: 4.0000 mg | ORAL_TABLET | Freq: Four times a day (QID) | ORAL | 0 refills | Status: DC

## 2020-05-29 MED ORDER — MESALAMINE 400 MG PO CPDR: 400.0000 mg | DELAYED_RELEASE_CAPSULE | Freq: Every day | ORAL | 3 refills | Status: AC

## 2020-05-29 MED ORDER — SPIRONOLACTONE 12.5 MG HALF TABLET
12.5000 mg | ORAL_TABLET | Freq: Every day | ORAL | Status: DC
  Administered 2020-05-29: 12.5 mg via ORAL
  Filled 2020-05-29: qty 1

## 2020-05-29 MED ORDER — CYANOCOBALAMIN 100 MCG PO TABS: 100.0000 ug | ORAL_TABLET | Freq: Every day | ORAL | 1 refills | Status: DC

## 2020-05-29 MED ORDER — IVABRADINE HCL 5 MG PO TABS: 5.0000 mg | ORAL_TABLET | Freq: Two times a day (BID) | ORAL | 3 refills | Status: DC

## 2020-05-29 MED ORDER — MESALAMINE 400 MG PO CPDR: 400.0000 mg | DELAYED_RELEASE_CAPSULE | Freq: Every day | ORAL | 3 refills | Status: DC

## 2020-05-29 MED ORDER — APIXABAN 5 MG PO TABS: 5.0000 mg | ORAL_TABLET | Freq: Two times a day (BID) | ORAL | 6 refills | Status: DC

## 2020-05-29 MED ORDER — LOSARTAN POTASSIUM 25 MG PO TABS: 25.0000 mg | ORAL_TABLET | Freq: Every day | ORAL | 0 refills | Status: DC

## 2020-05-29 MED ORDER — SPIRONOLACTONE 25 MG PO TABS: 12.5000 mg | ORAL_TABLET | Freq: Every day | ORAL | 3 refills | Status: DC

## 2020-05-29 MED ORDER — APIXABAN 5 MG PO TABS: 10.0000 mg | ORAL_TABLET | Freq: Two times a day (BID) | ORAL | 0 refills | Status: DC

## 2020-05-29 MED FILL — VITAMIN B-12 1000 MCG TABS: 1000 | 30 days supply | Qty: 30 | Fill #0

## 2020-05-29 MED FILL — MESALAMINE 400 MG CPDR: 400 | 30 days supply | Qty: 30 | Fill #0

## 2020-05-29 MED FILL — ONDANSETRON HCL 4 MG TABLET: 4 | 3 days supply | Qty: 12 | Fill #0

## 2020-05-29 MED FILL — LOSARTAN POTASSIUM 25 MG TA: 25 | 30 days supply | Qty: 30 | Fill #0

## 2020-05-29 MED FILL — FERROUS SULFATE 325 MG TAB: 325 (65 FE) | 30 days supply | Qty: 30 | Fill #0

## 2020-05-29 MED FILL — SPIRONOLACTONE 25 MG TABLET: 25 | 30 days supply | Qty: 15 | Fill #0

## 2020-05-29 MED FILL — ELIQUIS 5 MG TABLET: 5 | 30 days supply | Qty: 64 | Fill #0

## 2020-05-29 MED FILL — PANTOPRAZOLE SOD DR 40 MG T: 40 | 30 days supply | Qty: 30 | Fill #0

## 2020-05-29 NOTE — Progress Notes (Addendum)
Advanced Heart Failure Rounding Note  PCP-Cardiologist: No primary care provider on file.   Subjective:    Repeat limited echo yesterday showed interval improvement in size of pericardial effusion, now mild-moderate. LVEF remains 25-30%. RV moderately reduced.   cMRI showed no myocardial LGE, so no definitive evidence for prior MI, infiltrative disease, or myocarditis. Diffuse HK towards the base and less towards the apex.  ?Reverse Takotsubo pattern. Pericardial effusion on MRI felt to be moderate but no tamponade.    Feels ok. No dyspnea or CP. CVP 5. HR remains elevated despite addition of ivabradine. 110s resting, 120s-130s w/ ambulation. Hgb 8.3>>8.5.    cMRI 05/28/20 IMPRESSION: 1. Normal LV size with EF 37%, diffuse hypokinesis worse towards the base and less towards the apex. ?Reverse Takotsubo pattern.  2. Moderate to severely dilated RV with severe systolic dysfunction, EF 20%. RV function preserved at the apex.  3. No myocardial LGE, so no definitive evidence for prior MI, infiltrative disease, or myocarditis.  4. Moderate pericardial effusion primarily at the apex, no tamponade.    Objective:   Weight Range: 81.5 kg Body mass index is 22.46 kg/m.   Vital Signs:   Temp:  [98.7 F (37.1 C)-100.1 F (37.8 C)] 99.4 F (37.4 C) (03/01 0417) Pulse Rate:  [91-107] 103 (03/01 0417) Resp:  [16-19] 19 (03/01 0417) BP: (91-106)/(54-64) 106/64 (03/01 0417) SpO2:  [96 %-100 %] 96 % (03/01 0417) Last BM Date: 05/28/20  Weight change: Filed Weights   05/26/20 0427 05/27/20 0847 05/28/20 0500  Weight: 80.4 kg 80.2 kg 81.5 kg    Intake/Output:   Intake/Output Summary (Last 24 hours) at 05/29/2020 0711 Last data filed at 05/28/2020 1712 Gross per 24 hour  Intake 555.21 ml  Output --  Net 555.21 ml      Physical Exam    CVP 5 General:  Well appearing. No respiratory difficulty HEENT: normal Neck: supple. no JVD. Carotids 2+ bilat; no bruits. No  lymphadenopathy or thyromegaly appreciated. Cor: PMI nondisplaced. Regular rhythm, tachy rate. No rubs, gallops or murmurs. Lungs: clear Abdomen: soft, nontender, nondistended. No hepatosplenomegaly. No bruits or masses. Good bowel sounds. Extremities: no cyanosis, clubbing, rash, edema Neuro: alert & oriented x 3, cranial nerves grossly intact. moves all 4 extremities w/o difficulty. Affect pleasant.   Telemetry   Sinus tach 110s resting, 120s-130s w/ ambulation   EKG    No new EKG to review today   Labs    CBC Recent Labs    05/28/20 0522 05/29/20 0525  WBC 4.4 4.5  HGB 8.3* 8.5*  HCT 26.6* 25.9*  MCV 68.7* 67.3*  PLT 280 272   Basic Metabolic Panel Recent Labs    95/62/13 0522 05/29/20 0525  NA 133* 134*  K 4.2 4.4  CL 102 102  CO2 25 24  GLUCOSE 86 87  BUN 10 11  CREATININE 1.03 1.15  CALCIUM 7.1* 7.4*   Liver Function Tests No results for input(s): AST, ALT, ALKPHOS, BILITOT, PROT, ALBUMIN in the last 72 hours. No results for input(s): LIPASE, AMYLASE in the last 72 hours. Cardiac Enzymes No results for input(s): CKTOTAL, CKMB, CKMBINDEX, TROPONINI in the last 72 hours.  BNP: BNP (last 3 results) Recent Labs    05/23/20 1307  BNP 355.4*    ProBNP (last 3 results) No results for input(s): PROBNP in the last 8760 hours.   D-Dimer No results for input(s): DDIMER in the last 72 hours. Hemoglobin A1C No results for input(s): HGBA1C in the  last 72 hours. Fasting Lipid Panel No results for input(s): CHOL, HDL, LDLCALC, TRIG, CHOLHDL, LDLDIRECT in the last 72 hours. Thyroid Function Tests No results for input(s): TSH, T4TOTAL, T3FREE, THYROIDAB in the last 72 hours.  Invalid input(s): FREET3  Other results:   Imaging    MR CARDIAC MORPHOLOGY W WO CONTRAST  Result Date: 05/28/2020 CLINICAL DATA:  Cardiomyopathy of uncertain etiology EXAM: CARDIAC MRI TECHNIQUE: The patient was scanned on a 1.5 Tesla GE magnet. A dedicated cardiac coil was  used. Functional imaging was done using Fiesta sequences. 2,3, and 4 chamber views were done to assess for RWMA's. Modified Simpson's rule using a short axis stack was used to calculate an ejection fraction on a dedicated work [employer]. The patient received 8 cc of Gadavist. After 10 minutes inversion recovery sequences were used to assess for infiltration and scar tissue. FINDINGS: Limited images of the lung fields showed no gross abnormalities. There is a moderate pericardial effusion noted, primarily apically. No evidence for tamponade. Normal left ventricular size and wall thickness. EF 37%, there is diffuse hypokinesis worse towards the LV base and less towards the LV apex. The right ventricle is moderate to severely dilated with severe systolic dysfunction, EF 20%. Relative preservation of function at RV apex. Normal left and right atrial sizes. No significant mitral regurgitation. Trileaflet aortic valve with no stenosis or regurgitation. Moderate tricuspid regurgitation. On delayed enhancement imaging, no myocardial late gadolinium enhancement (LGE) was noted. Measurements: LVEDV 162 mL LVSV 60 mL LVEF 37% RVEDV 305 mL RVSV 60 mL RVEF 20% IMPRESSION: 1. Normal LV size with EF 37%, diffuse hypokinesis worse towards the base and less towards the apex. ?Reverse Takotsubo pattern. 2. Moderate to severely dilated RV with severe systolic dysfunction, EF 20%. RV function preserved at the apex. 3. No myocardial LGE, so no definitive evidence for prior MI, infiltrative disease, or myocarditis. 4. Moderate pericardial effusion primarily at the apex, no tamponade. Glenn [name] Electronically Signed   By: Marca Ancona M.D.   On: 05/28/2020 14:17   ECHOCARDIOGRAM LIMITED  Result Date: 05/28/2020    ECHOCARDIOGRAM LIMITED REPORT   Patient Name:   Glenn [name] Date of Exam: 05/28/2020 Medical Rec #:  [id number]          Height:       75.0 in Accession #:    [id number]         Weight:        179.7 lb Date of Birth:  2000/10/15          BSA:          2.097 m Patient Age:    20 years           BP:           103/59 mmHg Patient Gender: M                  HR:           110 bpm. Exam Location:  Inpatient Procedure: Cardiac Doppler, Limited Color Doppler and Limited Echo Indications:    I31.3 Pericardial effusion  History:        Patient has prior history of Echocardiogram examinations, most                 recent 05/25/2020. Sickle Cell Trait. Pulmonary Embolism.  Sonographer:    Elmarie Shiley Dance Referring Phys: [id number] BELKYS A REGALADO IMPRESSIONS  1. Since the last study on 05/25/2020 pericardial effusion has  improved, now mild.  2. Left ventricular ejection fraction, by estimation, is 25 to 30%. The left ventricle has severely decreased function. The left ventricle demonstrates global hypokinesis.  3. Right ventricular systolic function is moderately reduced. The right ventricular size is moderately enlarged. There is normal pulmonary artery systolic pressure. The estimated right ventricular systolic pressure is 32.4 mmHg.  4. Left atrial size was mildly dilated.  5. A small pericardial effusion is present. The pericardial effusion is circumferential. There is no evidence of cardiac tamponade.  6. Mild mitral valve regurgitation.  7. Tricuspid valve regurgitation is moderate.  8. Aortic valve regurgitation is trivial. FINDINGS  Left Ventricle: Left ventricular ejection fraction, by estimation, is 25 to 30%. The left ventricle has severely decreased function. The left ventricle demonstrates global hypokinesis. Right Ventricle: The right ventricular size is moderately enlarged. Right ventricular systolic function is moderately reduced. There is normal pulmonary artery systolic pressure. The tricuspid regurgitant velocity is 2.71 m/s, and with an assumed right atrial pressure of 3 mmHg, the estimated right ventricular systolic pressure is 32.4 mmHg. Left Atrium: Left atrial size was mildly dilated. Pericardium: A  small pericardial effusion is present. The pericardial effusion is circumferential. There is no evidence of cardiac tamponade. Mitral Valve: Mild mitral valve regurgitation. Tricuspid Valve: Tricuspid valve regurgitation is moderate. Aortic Valve: Aortic valve regurgitation is trivial. LEFT VENTRICLE PLAX 2D LVIDd:         5.20 cm LVIDs:         4.50 cm LV PW:         1.00 cm LV IVS:        0.80 cm LVOT diam:     2.20 cm LV SV:         44 LV SV Index:   21 LVOT Area:     3.80 cm  IVC IVC diam: 1.80 cm LEFT ATRIUM         Index LA diam:    4.00 cm 1.91 cm/m  AORTIC VALVE LVOT Vmax:   65.80 cm/s LVOT Vmean:  44.100 cm/s LVOT VTI:    0.117 m  AORTA Ao Root diam: 3.00 cm Ao Asc diam:  2.50 cm TRICUSPID VALVE TR Peak grad:   29.4 mmHg TR Vmax:        271.00 cm/s  SHUNTS Systemic VTI:  0.12 m Systemic Diam: 2.20 cm Tobias Alexander MD Electronically signed by Tobias Alexander MD Signature Date/Time: 05/28/2020/12:49:20 PM    Final      Medications:     Scheduled Medications: . apixaban  10 mg Oral BID   Followed by  . [START ON 05/30/2020] apixaban  5 mg Oral BID  . Chlorhexidine Gluconate Cloth  6 each Topical Daily  . ferrous sulfate  325 mg Oral Q breakfast  . influenza vac split quadrivalent PF  0.5 mL Intramuscular Tomorrow-1000  . ivabradine  5 mg Oral BID WC  . losartan  25 mg Oral Daily  . Mesalamine  400 mg Oral Daily  . pantoprazole  40 mg Oral Daily  . potassium chloride  40 mEq Oral Once  . sodium chloride flush  10-40 mL Intracatheter Q12H  . vitamin B-12  100 mcg Oral Daily    Infusions:   PRN Medications: sodium chloride flush    Assessment/Plan    1. Acute submassive PE: Bilateral PEs on CTA, possible component of pulmonary infarction.  DVT in left leg.  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  Has had recent Crohns flare, ?related to inflammation from Crohns.   - off IV heparin, now on Eliquis   - He is doing better symptomatically today and is on room air.  No plans for thrombolysis given clinical improvement and moderate pericardial effusion with some risk for hemorrhagic conversion.  - Now on Eliquis, think he will need long-term with no evident trigger for PE.  - Hypercoagulable workup sent.  2. Pericardial effusion: Moderate on echo 2/25 without tamponade.  Cause uncertain, ?related to inflammation from Crohns, ?due to large PE.   - Repeat limited echo 2/28 noted interval change, size of effusion small, felt moderate on cMRI but no evidence of tamponade.  3. Acute on chronic biventricular failure: He has severe RV dysfunction with McConnell's sign and moderate RV enlargement, this is from the large PE.  However, he also has decreased LV systolic function, EF appears to be around 30%.  ?Pre-existing cardiomyopathy. H/o recent COVID infection however cMRI w/ no myocardial LGE, so no definitive evidence for myocarditis, prior MI or infiltrative disease. 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  - Volume status improved w/ diuresis. CVP 5  - Continue low dose losartan 25 mg daily. BP too soft for Entresto  - Continue ivabradine. May need increase to 7.5 bid  4. Crohns disease: Recent flare symptomatically.  He is on mesalamine.  5. Anemia: Hgb 8.5. Anemia panel c/w IDA - ferrous sulfate has been added  - would benefit from feraheme   - Given overall clinical picture of unprovoked DVT/PE, pericardial effusion, 70+ lb unintentional wt loss, unexplained IDA in a young male, Crohn's disease w/ higher risk for developing colorectal cancer, consider w/u to exclude malignancy.   Length of Stay: [address], [place]  05/29/2020, 7:11 AM  Advanced Heart Failure Team Pager [phone] (M-F; 7a - 4p)  Please contact [facility] for  night-coverage after hours (4p -7a ) and weekends on [email or web address]  Patient seen with PA, agree with the above note.    He feels good today.  CVP 5.  HR 100s (lower).  SBP 100s.  No dyspnea.   General: NAD Neck: No JVD, no thyromegaly or thyroid nodule.  Lungs: Clear to auscultation bilaterally with normal respiratory effort. CV: Nondisplaced PMI.  Heart regular S1/S2, no S3/S4, no murmur.  No peripheral edema.   Abdomen: Soft, nontender, no hepatosplenomegaly, no distention.  Skin: Intact without lesions or rashes.  Neurologic: Alert and oriented x 3.  Psych: Normal affect. Extremities: No clubbing or cyanosis.  HEENT: Normal.   Cardiac MRI with LVEF 37%, RV EF 20%.  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  He is not volume overloaded by CVP or exam.  - Continue ivabradine 5 mg bid.  - Continue losartan 25 mg daily (can transition to [facility] as outpatient if BP remains stable).  - Add spironolactone 12.5 daily.  - He will need echo again after about 3 months.   Continue Eliquis for large PE.  This will need to be long-term as no trigger found for PE (?related to inflammation from Crohns).  Hypercoagulable workup sent.   I think he could go home today.  Will need CHF clinic followup.  Cardiac meds for discharge: ivabradine 5 mg bid, spironolactone 12.5 daily, losartan 25 mg daily, apixaban.  Marca Ancona 05/29/2020 1:22 PM

## 2020-05-29 NOTE — TOC Benefit Eligibility Note (Signed)
Transition of Care [facility]) Benefit Eligibility Note    Patient Details  Name: Glenn [name] MRN: [id number] Date of Birth: Nov 16, 2000   Medication/Dose: Everlene Balls  10 MG DAILY        Prescription Coverage [facility]  and  [facility] TRANSITIONS OF CARE [facility]              Additional Notes: [facility]  [place] MEDICAID ;EFF-DATE 02-29-2020  CO-PAY- $4.00 FOR EACH PRESCRIPTION    Mardene Sayer Phone Number: 05/29/2020, 9:01 AM

## 2020-05-29 NOTE — Discharge Summary (Addendum)
25 to 30%. The left ventricle has severely decreased function. The left ventricle demonstrates global hypokinesis. There is mild left ventricular hypertrophy. Right Ventricle: McConnell's sign is present - apical sparing of contraction seen in PE. The right ventricular size is moderately enlarged. Right ventricular systolic function is moderately reduced. There is moderately elevated pulmonary artery systolic pressure. The tricuspid regurgitant velocity is 2.94 m/s, and with an assumed right atrial pressure of 15 mmHg, the estimated right ventricular systolic pressure is 49.6 mmHg. Right Atrium: Right atrial size was severely dilated. Pericardium: A moderately sized pericardial effusion is present. The pericardial effusion is circumferential. There is no evidence of cardiac tamponade. Tricuspid Valve: Tricuspid valve regurgitation is mild to moderate. LEFT VENTRICLE PLAX 2D LVIDd:         4.30 cm LVIDs:         4.20 cm LV PW:         1.30 cm LV IVS:        1.20 cm  LV Volumes (MOD) LV vol d, MOD A2C: 116.5 ml LV vol d, MOD A4C: 70.8 ml LV vol s, MOD A2C: 73.5 ml LV vol s, MOD A4C: 54.3 ml LV SV MOD A2C:     43.0 ml LV SV MOD A4C:     70.8 ml LV SV MOD BP:      29.6 ml IVC IVC diam: 2.10 cm LEFT ATRIUM         Index LA diam:    3.40 cm 1.61 cm/m   AORTA Ao Root diam: 2.90 cm Ao Asc diam:  2.30 cm TRICUSPID VALVE TR Peak grad:   34.6 mmHg TR Vmax:        294.00 cm/s Donato Schultz MD Electronically signed by Donato Schultz MD Signature Date/Time: 05/25/2020/11:33:53 AM    Final    ECHOCARDIOGRAM LIMITED  Result Date: 05/23/2020    ECHOCARDIOGRAM REPORT   Patient Name:   Glenn [name]  Date of Exam: 05/23/2020 Medical Rec #:  [id number]          Height:       75.0 in Accession #:    [id number]         Weight:       230.0 lb Date of Birth:  2000/09/08          BSA:          2.329 m Patient Age:    20 years           BP:           114/68 mmHg Patient Gender: M                  HR:           127 bpm. Exam Location:  Inpatient Procedure: Limited Echo, Color Doppler and Cardiac Doppler STAT ECHO Indications:    I26.02 Pulmonary embolus  History:        Patient has no prior history of Echocardiogram examinations.                 Risk Factors:COVID+ 10/2019.  Sonographer:    Irving Burton Senior RDCS Referring Phys: [id number] Elenore Paddy REESE  Sonographer Comments: Suspected COVID at time of study, limited to assess RV strain and check for thrombus in setting of PE. Myrla Halsted MD present for exam. IMPRESSIONS  1. Left ventricular ejection fraction, by estimation, is 30 to 35%. The left ventricle has moderately decreased function. The left ventricle demonstrates global hypokinesis. Left ventricular  Physician Discharge Summary  Glenn [name] ZOX:[id number] DOB: February 26, 2001 DOA: 05/23/2020  PCP: Ihor Gully, MD  Admit date: 05/23/2020 Discharge date: 05/29/2020  Admitted From: Home  Disposition: Home   Recommendations for Outpatient Follow-up:  1. Follow up with PCP in 1-2 weeks 2. Please obtain BMP/CBC in one week 3. Please follow up Hypercoagulable work up.  4. He will need further prescription for Eliquis, I provided 6 months supply. He will need life long anticoagulation.  5. Needs close follow up with cardiology, transition to [facility] if BP allows it.  6. He will need ECHO in 3 months to follow EF and pericardial effusion.    Home Health: none  Discharge Condition: Stable.  CODE STATUS: Full code Diet recommendation: Heart Healthy  Brief/Interim Summary: 20 year old with past medical history significant for Crohn's, asthma who presents with 5 days history of shortness of breath/exertional dyspnea, found to have bilateral pulmonary emboli with c/f Right heart strain and acute left lower extremity DVT in the setting of recent Covid infection and  Crohn's disease.  Patient was a started on IV heparin drip, he was also found to have biventricular heart failure.  Heart failure team consulted and following.    1-Acute Bilateral Pulmonary Embolism, Acute left lower extremity DVT: Right heart  Strain: -Plan to continue with IV heparin for now.  Plan to repeat echocardiogram to evaluate pericardial effusion 2/28. -Ultimately plan is to transition to [facility]. -Vitals continue to be stable.  -He was found not to be a candidate for thrombolytic therapy due to pericardial effusion and risk for hemorrhagic transformation. -Patient was transition to Eliquis 2/28. Pericardial effusion by echo was Mild.  -Hypercoagulable panel sent. Needs to be follow up.  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  -Started Cozaar 05/27/2020. Low dose spironolactone started today. On Ivabradine.   -Cardiac MRI negative for  myocarditis or infiltrative disease. ? Reverse Takotsubo.  -Repeated ECHO 2/28:  Ef 25 %, mild pericardial effusion.  -Received two doses of lasix.   3-Crohn's disease: Initially diagnosed in 2019, history of frequent flares. On admission he was having some diarrhea and abdominal pain but has since resolved Continue with mesalamine. Provided prescriptions.  I have arrange follow up with Dr Ewing Schlein.   4-Hyponatremia: Mild improved  Anemia: Stable 8 Iron deficiency. Started on oral iron.  Started B 12 supplement.     Discharge Diagnoses:  Active Problems:   Pulmonary emboli (HCC)   Crohn's disease (HCC)   Pericardial effusion   Deep vein thrombosis (DVT) of left lower extremity (HCC)   Acute systolic CHF (congestive heart failure) [facility])    Discharge Instructions  Discharge Instructions    Diet - low sodium heart healthy   Complete by: As directed    Increase activity slowly   Complete by: As directed      Allergies as of 05/29/2020      Reactions   Penicillins Hives   Cefdinir Hives      Medication List    STOP taking these medications   acetaminophen 325 MG tablet Commonly known as: Tylenol   benzonatate 100 MG capsule Commonly known as: TESSALON   mesalamine 0.375 g 24 hr capsule Commonly known as: APRISO Replaced by: Mesalamine 400 MG Cpdr DR capsule   promethazine-dextromethorphan 6.25-15 MG/5ML syrup Commonly known as: PROMETHAZINE-DM  Physician Discharge Summary  Glenn [name] ZOX:[id number] DOB: February 26, 2001 DOA: 05/23/2020  PCP: Ihor Gully, MD  Admit date: 05/23/2020 Discharge date: 05/29/2020  Admitted From: Home  Disposition: Home   Recommendations for Outpatient Follow-up:  1. Follow up with PCP in 1-2 weeks 2. Please obtain BMP/CBC in one week 3. Please follow up Hypercoagulable work up.  4. He will need further prescription for Eliquis, I provided 6 months supply. He will need life long anticoagulation.  5. Needs close follow up with cardiology, transition to [facility] if BP allows it.  6. He will need ECHO in 3 months to follow EF and pericardial effusion.    Home Health: none  Discharge Condition: Stable.  CODE STATUS: Full code Diet recommendation: Heart Healthy  Brief/Interim Summary: 20 year old with past medical history significant for Crohn's, asthma who presents with 5 days history of shortness of breath/exertional dyspnea, found to have bilateral pulmonary emboli with c/f Right heart strain and acute left lower extremity DVT in the setting of recent Covid infection and  Crohn's disease.  Patient was a started on IV heparin drip, he was also found to have biventricular heart failure.  Heart failure team consulted and following.    1-Acute Bilateral Pulmonary Embolism, Acute left lower extremity DVT: Right heart  Strain: -Plan to continue with IV heparin for now.  Plan to repeat echocardiogram to evaluate pericardial effusion 2/28. -Ultimately plan is to transition to [facility]. -Vitals continue to be stable.  -He was found not to be a candidate for thrombolytic therapy due to pericardial effusion and risk for hemorrhagic transformation. -Patient was transition to Eliquis 2/28. Pericardial effusion by echo was Mild.  -Hypercoagulable panel sent. Needs to be follow up.  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  -Started Cozaar 05/27/2020. Low dose spironolactone started today. On Ivabradine.   -Cardiac MRI negative for  myocarditis or infiltrative disease. ? Reverse Takotsubo.  -Repeated ECHO 2/28:  Ef 25 %, mild pericardial effusion.  -Received two doses of lasix.   3-Crohn's disease: Initially diagnosed in 2019, history of frequent flares. On admission he was having some diarrhea and abdominal pain but has since resolved Continue with mesalamine. Provided prescriptions.  I have arrange follow up with Dr Ewing Schlein.   4-Hyponatremia: Mild improved  Anemia: Stable 8 Iron deficiency. Started on oral iron.  Started B 12 supplement.     Discharge Diagnoses:  Active Problems:   Pulmonary emboli (HCC)   Crohn's disease (HCC)   Pericardial effusion   Deep vein thrombosis (DVT) of left lower extremity (HCC)   Acute systolic CHF (congestive heart failure) [facility])    Discharge Instructions  Discharge Instructions    Diet - low sodium heart healthy   Complete by: As directed    Increase activity slowly   Complete by: As directed      Allergies as of 05/29/2020      Reactions   Penicillins Hives   Cefdinir Hives      Medication List    STOP taking these medications   acetaminophen 325 MG tablet Commonly known as: Tylenol   benzonatate 100 MG capsule Commonly known as: TESSALON   mesalamine 0.375 g 24 hr capsule Commonly known as: APRISO Replaced by: Mesalamine 400 MG Cpdr DR capsule   promethazine-dextromethorphan 6.25-15 MG/5ML syrup Commonly known as: PROMETHAZINE-DM  PERO     Full                                                        +---------+---------------+---------+-----------+----------+--------------+     Summary: BILATERAL: -No evidence of popliteal cyst, bilaterally. RIGHT: - There is no evidence of deep vein thrombosis in the lower extremity. - There is no evidence of superficial venous thrombosis.  LEFT: - Findings consistent with acute deep vein thrombosis involving the left popliteal vein, and left femoral vein. - There is no evidence of superficial venous thrombosis.  *See table(s) above for measurements and observations. Electronically signed by Heath Lark on 05/24/2020 at 8:10:05 PM.    Final    ECHOCARDIOGRAM LIMITED  Result Date: 05/28/2020    ECHOCARDIOGRAM LIMITED REPORT   Patient Name:   Glenn [name] Date of Exam: 05/28/2020 Medical Rec #:  [id number]          Height:       75.0 in Accession #:    [id number]         Weight:       179.7 lb Date of Birth:  2000/11/08          BSA:          2.097 m Patient Age:    20 years           BP:           103/59 mmHg Patient Gender: M                  HR:           110 bpm. Exam Location:  Inpatient Procedure: Cardiac Doppler, Limited Color Doppler and Limited Echo Indications:    I31.3 Pericardial effusion  History:        Patient  has prior history of Echocardiogram examinations, most                 recent 05/25/2020. Sickle Cell Trait. Pulmonary Embolism.  Sonographer:    Elmarie Shiley Dance Referring Phys: [id number] Dempsey Ahonen A Sakiyah Shur IMPRESSIONS  1. Since the last study on 05/25/2020 pericardial effusion has improved, now mild.  2. Left ventricular ejection fraction, by estimation, is 25 to 30%. The left ventricle has severely decreased function. The left ventricle demonstrates global hypokinesis.  3. Right ventricular systolic function is moderately reduced. The right ventricular size is moderately enlarged. There is normal pulmonary artery systolic pressure. The estimated right ventricular systolic pressure is 32.4 mmHg.  4. Left atrial size was mildly dilated.  5. A small pericardial effusion is present. The pericardial effusion is circumferential. There is no evidence of cardiac tamponade.  6. Mild mitral valve regurgitation.  7. Tricuspid valve regurgitation is moderate.  8. Aortic valve regurgitation is trivial. FINDINGS  Left Ventricle: Left ventricular ejection fraction, by estimation, is 25 to 30%. The left ventricle has severely decreased function. The left ventricle demonstrates global hypokinesis. Right Ventricle: The right ventricular size is moderately enlarged. Right ventricular systolic function is moderately reduced. There is normal pulmonary artery systolic pressure. The tricuspid regurgitant velocity is 2.71 m/s, and with an assumed right atrial pressure of 3 mmHg, the estimated right ventricular systolic pressure is 32.4 mmHg. Left Atrium: Left atrial size was mildly dilated. Pericardium: A small pericardial effusion is present. The pericardial effusion is  Physician Discharge Summary  Glenn [name] ZOX:[id number] DOB: February 26, 2001 DOA: 05/23/2020  PCP: Ihor Gully, MD  Admit date: 05/23/2020 Discharge date: 05/29/2020  Admitted From: Home  Disposition: Home   Recommendations for Outpatient Follow-up:  1. Follow up with PCP in 1-2 weeks 2. Please obtain BMP/CBC in one week 3. Please follow up Hypercoagulable work up.  4. He will need further prescription for Eliquis, I provided 6 months supply. He will need life long anticoagulation.  5. Needs close follow up with cardiology, transition to [facility] if BP allows it.  6. He will need ECHO in 3 months to follow EF and pericardial effusion.    Home Health: none  Discharge Condition: Stable.  CODE STATUS: Full code Diet recommendation: Heart Healthy  Brief/Interim Summary: 20 year old with past medical history significant for Crohn's, asthma who presents with 5 days history of shortness of breath/exertional dyspnea, found to have bilateral pulmonary emboli with c/f Right heart strain and acute left lower extremity DVT in the setting of recent Covid infection and  Crohn's disease.  Patient was a started on IV heparin drip, he was also found to have biventricular heart failure.  Heart failure team consulted and following.    1-Acute Bilateral Pulmonary Embolism, Acute left lower extremity DVT: Right heart  Strain: -Plan to continue with IV heparin for now.  Plan to repeat echocardiogram to evaluate pericardial effusion 2/28. -Ultimately plan is to transition to [facility]. -Vitals continue to be stable.  -He was found not to be a candidate for thrombolytic therapy due to pericardial effusion and risk for hemorrhagic transformation. -Patient was transition to Eliquis 2/28. Pericardial effusion by echo was Mild.  -Hypercoagulable panel sent. Needs to be follow up.  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  -Started Cozaar 05/27/2020. Low dose spironolactone started today. On Ivabradine.   -Cardiac MRI negative for  myocarditis or infiltrative disease. ? Reverse Takotsubo.  -Repeated ECHO 2/28:  Ef 25 %, mild pericardial effusion.  -Received two doses of lasix.   3-Crohn's disease: Initially diagnosed in 2019, history of frequent flares. On admission he was having some diarrhea and abdominal pain but has since resolved Continue with mesalamine. Provided prescriptions.  I have arrange follow up with Dr Ewing Schlein.   4-Hyponatremia: Mild improved  Anemia: Stable 8 Iron deficiency. Started on oral iron.  Started B 12 supplement.     Discharge Diagnoses:  Active Problems:   Pulmonary emboli (HCC)   Crohn's disease (HCC)   Pericardial effusion   Deep vein thrombosis (DVT) of left lower extremity (HCC)   Acute systolic CHF (congestive heart failure) [facility])    Discharge Instructions  Discharge Instructions    Diet - low sodium heart healthy   Complete by: As directed    Increase activity slowly   Complete by: As directed      Allergies as of 05/29/2020      Reactions   Penicillins Hives   Cefdinir Hives      Medication List    STOP taking these medications   acetaminophen 325 MG tablet Commonly known as: Tylenol   benzonatate 100 MG capsule Commonly known as: TESSALON   mesalamine 0.375 g 24 hr capsule Commonly known as: APRISO Replaced by: Mesalamine 400 MG Cpdr DR capsule   promethazine-dextromethorphan 6.25-15 MG/5ML syrup Commonly known as: PROMETHAZINE-DM  25 to 30%. The left ventricle has severely decreased function. The left ventricle demonstrates global hypokinesis. There is mild left ventricular hypertrophy. Right Ventricle: McConnell's sign is present - apical sparing of contraction seen in PE. The right ventricular size is moderately enlarged. Right ventricular systolic function is moderately reduced. There is moderately elevated pulmonary artery systolic pressure. The tricuspid regurgitant velocity is 2.94 m/s, and with an assumed right atrial pressure of 15 mmHg, the estimated right ventricular systolic pressure is 49.6 mmHg. Right Atrium: Right atrial size was severely dilated. Pericardium: A moderately sized pericardial effusion is present. The pericardial effusion is circumferential. There is no evidence of cardiac tamponade. Tricuspid Valve: Tricuspid valve regurgitation is mild to moderate. LEFT VENTRICLE PLAX 2D LVIDd:         4.30 cm LVIDs:         4.20 cm LV PW:         1.30 cm LV IVS:        1.20 cm  LV Volumes (MOD) LV vol d, MOD A2C: 116.5 ml LV vol d, MOD A4C: 70.8 ml LV vol s, MOD A2C: 73.5 ml LV vol s, MOD A4C: 54.3 ml LV SV MOD A2C:     43.0 ml LV SV MOD A4C:     70.8 ml LV SV MOD BP:      29.6 ml IVC IVC diam: 2.10 cm LEFT ATRIUM         Index LA diam:    3.40 cm 1.61 cm/m   AORTA Ao Root diam: 2.90 cm Ao Asc diam:  2.30 cm TRICUSPID VALVE TR Peak grad:   34.6 mmHg TR Vmax:        294.00 cm/s Donato Schultz MD Electronically signed by Donato Schultz MD Signature Date/Time: 05/25/2020/11:33:53 AM    Final    ECHOCARDIOGRAM LIMITED  Result Date: 05/23/2020    ECHOCARDIOGRAM REPORT   Patient Name:   Glenn [name]  Date of Exam: 05/23/2020 Medical Rec #:  [id number]          Height:       75.0 in Accession #:    [id number]         Weight:       230.0 lb Date of Birth:  2000/09/08          BSA:          2.329 m Patient Age:    20 years           BP:           114/68 mmHg Patient Gender: M                  HR:           127 bpm. Exam Location:  Inpatient Procedure: Limited Echo, Color Doppler and Cardiac Doppler STAT ECHO Indications:    I26.02 Pulmonary embolus  History:        Patient has no prior history of Echocardiogram examinations.                 Risk Factors:COVID+ 10/2019.  Sonographer:    Irving Burton Senior RDCS Referring Phys: [id number] Elenore Paddy REESE  Sonographer Comments: Suspected COVID at time of study, limited to assess RV strain and check for thrombus in setting of PE. Myrla Halsted MD present for exam. IMPRESSIONS  1. Left ventricular ejection fraction, by estimation, is 30 to 35%. The left ventricle has moderately decreased function. The left ventricle demonstrates global hypokinesis. Left ventricular  Physician Discharge Summary  Glenn [name] ZOX:[id number] DOB: February 26, 2001 DOA: 05/23/2020  PCP: Ihor Gully, MD  Admit date: 05/23/2020 Discharge date: 05/29/2020  Admitted From: Home  Disposition: Home   Recommendations for Outpatient Follow-up:  1. Follow up with PCP in 1-2 weeks 2. Please obtain BMP/CBC in one week 3. Please follow up Hypercoagulable work up.  4. He will need further prescription for Eliquis, I provided 6 months supply. He will need life long anticoagulation.  5. Needs close follow up with cardiology, transition to [facility] if BP allows it.  6. He will need ECHO in 3 months to follow EF and pericardial effusion.    Home Health: none  Discharge Condition: Stable.  CODE STATUS: Full code Diet recommendation: Heart Healthy  Brief/Interim Summary: 20 year old with past medical history significant for Crohn's, asthma who presents with 5 days history of shortness of breath/exertional dyspnea, found to have bilateral pulmonary emboli with c/f Right heart strain and acute left lower extremity DVT in the setting of recent Covid infection and  Crohn's disease.  Patient was a started on IV heparin drip, he was also found to have biventricular heart failure.  Heart failure team consulted and following.    1-Acute Bilateral Pulmonary Embolism, Acute left lower extremity DVT: Right heart  Strain: -Plan to continue with IV heparin for now.  Plan to repeat echocardiogram to evaluate pericardial effusion 2/28. -Ultimately plan is to transition to [facility]. -Vitals continue to be stable.  -He was found not to be a candidate for thrombolytic therapy due to pericardial effusion and risk for hemorrhagic transformation. -Patient was transition to Eliquis 2/28. Pericardial effusion by echo was Mild.  -Hypercoagulable panel sent. Needs to be follow up.  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  -Started Cozaar 05/27/2020. Low dose spironolactone started today. On Ivabradine.   -Cardiac MRI negative for  myocarditis or infiltrative disease. ? Reverse Takotsubo.  -Repeated ECHO 2/28:  Ef 25 %, mild pericardial effusion.  -Received two doses of lasix.   3-Crohn's disease: Initially diagnosed in 2019, history of frequent flares. On admission he was having some diarrhea and abdominal pain but has since resolved Continue with mesalamine. Provided prescriptions.  I have arrange follow up with Dr Ewing Schlein.   4-Hyponatremia: Mild improved  Anemia: Stable 8 Iron deficiency. Started on oral iron.  Started B 12 supplement.     Discharge Diagnoses:  Active Problems:   Pulmonary emboli (HCC)   Crohn's disease (HCC)   Pericardial effusion   Deep vein thrombosis (DVT) of left lower extremity (HCC)   Acute systolic CHF (congestive heart failure) [facility])    Discharge Instructions  Discharge Instructions    Diet - low sodium heart healthy   Complete by: As directed    Increase activity slowly   Complete by: As directed      Allergies as of 05/29/2020      Reactions   Penicillins Hives   Cefdinir Hives      Medication List    STOP taking these medications   acetaminophen 325 MG tablet Commonly known as: Tylenol   benzonatate 100 MG capsule Commonly known as: TESSALON   mesalamine 0.375 g 24 hr capsule Commonly known as: APRISO Replaced by: Mesalamine 400 MG Cpdr DR capsule   promethazine-dextromethorphan 6.25-15 MG/5ML syrup Commonly known as: PROMETHAZINE-DM  PERO     Full                                                        +---------+---------------+---------+-----------+----------+--------------+     Summary: BILATERAL: -No evidence of popliteal cyst, bilaterally. RIGHT: - There is no evidence of deep vein thrombosis in the lower extremity. - There is no evidence of superficial venous thrombosis.  LEFT: - Findings consistent with acute deep vein thrombosis involving the left popliteal vein, and left femoral vein. - There is no evidence of superficial venous thrombosis.  *See table(s) above for measurements and observations. Electronically signed by Heath Lark on 05/24/2020 at 8:10:05 PM.    Final    ECHOCARDIOGRAM LIMITED  Result Date: 05/28/2020    ECHOCARDIOGRAM LIMITED REPORT   Patient Name:   Glenn [name] Date of Exam: 05/28/2020 Medical Rec #:  [id number]          Height:       75.0 in Accession #:    [id number]         Weight:       179.7 lb Date of Birth:  2000/11/08          BSA:          2.097 m Patient Age:    20 years           BP:           103/59 mmHg Patient Gender: M                  HR:           110 bpm. Exam Location:  Inpatient Procedure: Cardiac Doppler, Limited Color Doppler and Limited Echo Indications:    I31.3 Pericardial effusion  History:        Patient  has prior history of Echocardiogram examinations, most                 recent 05/25/2020. Sickle Cell Trait. Pulmonary Embolism.  Sonographer:    Elmarie Shiley Dance Referring Phys: [id number] Dempsey Ahonen A Sakiyah Shur IMPRESSIONS  1. Since the last study on 05/25/2020 pericardial effusion has improved, now mild.  2. Left ventricular ejection fraction, by estimation, is 25 to 30%. The left ventricle has severely decreased function. The left ventricle demonstrates global hypokinesis.  3. Right ventricular systolic function is moderately reduced. The right ventricular size is moderately enlarged. There is normal pulmonary artery systolic pressure. The estimated right ventricular systolic pressure is 32.4 mmHg.  4. Left atrial size was mildly dilated.  5. A small pericardial effusion is present. The pericardial effusion is circumferential. There is no evidence of cardiac tamponade.  6. Mild mitral valve regurgitation.  7. Tricuspid valve regurgitation is moderate.  8. Aortic valve regurgitation is trivial. FINDINGS  Left Ventricle: Left ventricular ejection fraction, by estimation, is 25 to 30%. The left ventricle has severely decreased function. The left ventricle demonstrates global hypokinesis. Right Ventricle: The right ventricular size is moderately enlarged. Right ventricular systolic function is moderately reduced. There is normal pulmonary artery systolic pressure. The tricuspid regurgitant velocity is 2.71 m/s, and with an assumed right atrial pressure of 3 mmHg, the estimated right ventricular systolic pressure is 32.4 mmHg. Left Atrium: Left atrial size was mildly dilated. Pericardium: A small pericardial effusion is present. The pericardial effusion is  PERO     Full                                                        +---------+---------------+---------+-----------+----------+--------------+     Summary: BILATERAL: -No evidence of popliteal cyst, bilaterally. RIGHT: - There is no evidence of deep vein thrombosis in the lower extremity. - There is no evidence of superficial venous thrombosis.  LEFT: - Findings consistent with acute deep vein thrombosis involving the left popliteal vein, and left femoral vein. - There is no evidence of superficial venous thrombosis.  *See table(s) above for measurements and observations. Electronically signed by Heath Lark on 05/24/2020 at 8:10:05 PM.    Final    ECHOCARDIOGRAM LIMITED  Result Date: 05/28/2020    ECHOCARDIOGRAM LIMITED REPORT   Patient Name:   Glenn [name] Date of Exam: 05/28/2020 Medical Rec #:  [id number]          Height:       75.0 in Accession #:    [id number]         Weight:       179.7 lb Date of Birth:  2000/11/08          BSA:          2.097 m Patient Age:    20 years           BP:           103/59 mmHg Patient Gender: M                  HR:           110 bpm. Exam Location:  Inpatient Procedure: Cardiac Doppler, Limited Color Doppler and Limited Echo Indications:    I31.3 Pericardial effusion  History:        Patient  has prior history of Echocardiogram examinations, most                 recent 05/25/2020. Sickle Cell Trait. Pulmonary Embolism.  Sonographer:    Elmarie Shiley Dance Referring Phys: [id number] Dempsey Ahonen A Sakiyah Shur IMPRESSIONS  1. Since the last study on 05/25/2020 pericardial effusion has improved, now mild.  2. Left ventricular ejection fraction, by estimation, is 25 to 30%. The left ventricle has severely decreased function. The left ventricle demonstrates global hypokinesis.  3. Right ventricular systolic function is moderately reduced. The right ventricular size is moderately enlarged. There is normal pulmonary artery systolic pressure. The estimated right ventricular systolic pressure is 32.4 mmHg.  4. Left atrial size was mildly dilated.  5. A small pericardial effusion is present. The pericardial effusion is circumferential. There is no evidence of cardiac tamponade.  6. Mild mitral valve regurgitation.  7. Tricuspid valve regurgitation is moderate.  8. Aortic valve regurgitation is trivial. FINDINGS  Left Ventricle: Left ventricular ejection fraction, by estimation, is 25 to 30%. The left ventricle has severely decreased function. The left ventricle demonstrates global hypokinesis. Right Ventricle: The right ventricular size is moderately enlarged. Right ventricular systolic function is moderately reduced. There is normal pulmonary artery systolic pressure. The tricuspid regurgitant velocity is 2.71 m/s, and with an assumed right atrial pressure of 3 mmHg, the estimated right ventricular systolic pressure is 32.4 mmHg. Left Atrium: Left atrial size was mildly dilated. Pericardium: A small pericardial effusion is present. The pericardial effusion is  Physician Discharge Summary  Glenn [name] ZOX:[id number] DOB: February 26, 2001 DOA: 05/23/2020  PCP: Ihor Gully, MD  Admit date: 05/23/2020 Discharge date: 05/29/2020  Admitted From: Home  Disposition: Home   Recommendations for Outpatient Follow-up:  1. Follow up with PCP in 1-2 weeks 2. Please obtain BMP/CBC in one week 3. Please follow up Hypercoagulable work up.  4. He will need further prescription for Eliquis, I provided 6 months supply. He will need life long anticoagulation.  5. Needs close follow up with cardiology, transition to [facility] if BP allows it.  6. He will need ECHO in 3 months to follow EF and pericardial effusion.    Home Health: none  Discharge Condition: Stable.  CODE STATUS: Full code Diet recommendation: Heart Healthy  Brief/Interim Summary: 20 year old with past medical history significant for Crohn's, asthma who presents with 5 days history of shortness of breath/exertional dyspnea, found to have bilateral pulmonary emboli with c/f Right heart strain and acute left lower extremity DVT in the setting of recent Covid infection and  Crohn's disease.  Patient was a started on IV heparin drip, he was also found to have biventricular heart failure.  Heart failure team consulted and following.    1-Acute Bilateral Pulmonary Embolism, Acute left lower extremity DVT: Right heart  Strain: -Plan to continue with IV heparin for now.  Plan to repeat echocardiogram to evaluate pericardial effusion 2/28. -Ultimately plan is to transition to [facility]. -Vitals continue to be stable.  -He was found not to be a candidate for thrombolytic therapy due to pericardial effusion and risk for hemorrhagic transformation. -Patient was transition to Eliquis 2/28. Pericardial effusion by echo was Mild.  -Hypercoagulable panel sent. Needs to be follow up.  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  -Started Cozaar 05/27/2020. Low dose spironolactone started today. On Ivabradine.   -Cardiac MRI negative for  myocarditis or infiltrative disease. ? Reverse Takotsubo.  -Repeated ECHO 2/28:  Ef 25 %, mild pericardial effusion.  -Received two doses of lasix.   3-Crohn's disease: Initially diagnosed in 2019, history of frequent flares. On admission he was having some diarrhea and abdominal pain but has since resolved Continue with mesalamine. Provided prescriptions.  I have arrange follow up with Dr Ewing Schlein.   4-Hyponatremia: Mild improved  Anemia: Stable 8 Iron deficiency. Started on oral iron.  Started B 12 supplement.     Discharge Diagnoses:  Active Problems:   Pulmonary emboli (HCC)   Crohn's disease (HCC)   Pericardial effusion   Deep vein thrombosis (DVT) of left lower extremity (HCC)   Acute systolic CHF (congestive heart failure) [facility])    Discharge Instructions  Discharge Instructions    Diet - low sodium heart healthy   Complete by: As directed    Increase activity slowly   Complete by: As directed      Allergies as of 05/29/2020      Reactions   Penicillins Hives   Cefdinir Hives      Medication List    STOP taking these medications   acetaminophen 325 MG tablet Commonly known as: Tylenol   benzonatate 100 MG capsule Commonly known as: TESSALON   mesalamine 0.375 g 24 hr capsule Commonly known as: APRISO Replaced by: Mesalamine 400 MG Cpdr DR capsule   promethazine-dextromethorphan 6.25-15 MG/5ML syrup Commonly known as: PROMETHAZINE-DM  Physician Discharge Summary  Glenn [name] ZOX:[id number] DOB: February 26, 2001 DOA: 05/23/2020  PCP: Ihor Gully, MD  Admit date: 05/23/2020 Discharge date: 05/29/2020  Admitted From: Home  Disposition: Home   Recommendations for Outpatient Follow-up:  1. Follow up with PCP in 1-2 weeks 2. Please obtain BMP/CBC in one week 3. Please follow up Hypercoagulable work up.  4. He will need further prescription for Eliquis, I provided 6 months supply. He will need life long anticoagulation.  5. Needs close follow up with cardiology, transition to [facility] if BP allows it.  6. He will need ECHO in 3 months to follow EF and pericardial effusion.    Home Health: none  Discharge Condition: Stable.  CODE STATUS: Full code Diet recommendation: Heart Healthy  Brief/Interim Summary: 20 year old with past medical history significant for Crohn's, asthma who presents with 5 days history of shortness of breath/exertional dyspnea, found to have bilateral pulmonary emboli with c/f Right heart strain and acute left lower extremity DVT in the setting of recent Covid infection and  Crohn's disease.  Patient was a started on IV heparin drip, he was also found to have biventricular heart failure.  Heart failure team consulted and following.    1-Acute Bilateral Pulmonary Embolism, Acute left lower extremity DVT: Right heart  Strain: -Plan to continue with IV heparin for now.  Plan to repeat echocardiogram to evaluate pericardial effusion 2/28. -Ultimately plan is to transition to [facility]. -Vitals continue to be stable.  -He was found not to be a candidate for thrombolytic therapy due to pericardial effusion and risk for hemorrhagic transformation. -Patient was transition to Eliquis 2/28. Pericardial effusion by echo was Mild.  -Hypercoagulable panel sent. Needs to be follow up.  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  -Started Cozaar 05/27/2020. Low dose spironolactone started today. On Ivabradine.   -Cardiac MRI negative for  myocarditis or infiltrative disease. ? Reverse Takotsubo.  -Repeated ECHO 2/28:  Ef 25 %, mild pericardial effusion.  -Received two doses of lasix.   3-Crohn's disease: Initially diagnosed in 2019, history of frequent flares. On admission he was having some diarrhea and abdominal pain but has since resolved Continue with mesalamine. Provided prescriptions.  I have arrange follow up with Dr Ewing Schlein.   4-Hyponatremia: Mild improved  Anemia: Stable 8 Iron deficiency. Started on oral iron.  Started B 12 supplement.     Discharge Diagnoses:  Active Problems:   Pulmonary emboli (HCC)   Crohn's disease (HCC)   Pericardial effusion   Deep vein thrombosis (DVT) of left lower extremity (HCC)   Acute systolic CHF (congestive heart failure) [facility])    Discharge Instructions  Discharge Instructions    Diet - low sodium heart healthy   Complete by: As directed    Increase activity slowly   Complete by: As directed      Allergies as of 05/29/2020      Reactions   Penicillins Hives   Cefdinir Hives      Medication List    STOP taking these medications   acetaminophen 325 MG tablet Commonly known as: Tylenol   benzonatate 100 MG capsule Commonly known as: TESSALON   mesalamine 0.375 g 24 hr capsule Commonly known as: APRISO Replaced by: Mesalamine 400 MG Cpdr DR capsule   promethazine-dextromethorphan 6.25-15 MG/5ML syrup Commonly known as: PROMETHAZINE-DM  25 to 30%. The left ventricle has severely decreased function. The left ventricle demonstrates global hypokinesis. There is mild left ventricular hypertrophy. Right Ventricle: McConnell's sign is present - apical sparing of contraction seen in PE. The right ventricular size is moderately enlarged. Right ventricular systolic function is moderately reduced. There is moderately elevated pulmonary artery systolic pressure. The tricuspid regurgitant velocity is 2.94 m/s, and with an assumed right atrial pressure of 15 mmHg, the estimated right ventricular systolic pressure is 49.6 mmHg. Right Atrium: Right atrial size was severely dilated. Pericardium: A moderately sized pericardial effusion is present. The pericardial effusion is circumferential. There is no evidence of cardiac tamponade. Tricuspid Valve: Tricuspid valve regurgitation is mild to moderate. LEFT VENTRICLE PLAX 2D LVIDd:         4.30 cm LVIDs:         4.20 cm LV PW:         1.30 cm LV IVS:        1.20 cm  LV Volumes (MOD) LV vol d, MOD A2C: 116.5 ml LV vol d, MOD A4C: 70.8 ml LV vol s, MOD A2C: 73.5 ml LV vol s, MOD A4C: 54.3 ml LV SV MOD A2C:     43.0 ml LV SV MOD A4C:     70.8 ml LV SV MOD BP:      29.6 ml IVC IVC diam: 2.10 cm LEFT ATRIUM         Index LA diam:    3.40 cm 1.61 cm/m   AORTA Ao Root diam: 2.90 cm Ao Asc diam:  2.30 cm TRICUSPID VALVE TR Peak grad:   34.6 mmHg TR Vmax:        294.00 cm/s Donato Schultz MD Electronically signed by Donato Schultz MD Signature Date/Time: 05/25/2020/11:33:53 AM    Final    ECHOCARDIOGRAM LIMITED  Result Date: 05/23/2020    ECHOCARDIOGRAM REPORT   Patient Name:   Glenn [name]  Date of Exam: 05/23/2020 Medical Rec #:  [id number]          Height:       75.0 in Accession #:    [id number]         Weight:       230.0 lb Date of Birth:  2000/09/08          BSA:          2.329 m Patient Age:    20 years           BP:           114/68 mmHg Patient Gender: M                  HR:           127 bpm. Exam Location:  Inpatient Procedure: Limited Echo, Color Doppler and Cardiac Doppler STAT ECHO Indications:    I26.02 Pulmonary embolus  History:        Patient has no prior history of Echocardiogram examinations.                 Risk Factors:COVID+ 10/2019.  Sonographer:    Irving Burton Senior RDCS Referring Phys: [id number] Elenore Paddy REESE  Sonographer Comments: Suspected COVID at time of study, limited to assess RV strain and check for thrombus in setting of PE. Myrla Halsted MD present for exam. IMPRESSIONS  1. Left ventricular ejection fraction, by estimation, is 30 to 35%. The left ventricle has moderately decreased function. The left ventricle demonstrates global hypokinesis. Left ventricular  25 to 30%. The left ventricle has severely decreased function. The left ventricle demonstrates global hypokinesis. There is mild left ventricular hypertrophy. Right Ventricle: McConnell's sign is present - apical sparing of contraction seen in PE. The right ventricular size is moderately enlarged. Right ventricular systolic function is moderately reduced. There is moderately elevated pulmonary artery systolic pressure. The tricuspid regurgitant velocity is 2.94 m/s, and with an assumed right atrial pressure of 15 mmHg, the estimated right ventricular systolic pressure is 49.6 mmHg. Right Atrium: Right atrial size was severely dilated. Pericardium: A moderately sized pericardial effusion is present. The pericardial effusion is circumferential. There is no evidence of cardiac tamponade. Tricuspid Valve: Tricuspid valve regurgitation is mild to moderate. LEFT VENTRICLE PLAX 2D LVIDd:         4.30 cm LVIDs:         4.20 cm LV PW:         1.30 cm LV IVS:        1.20 cm  LV Volumes (MOD) LV vol d, MOD A2C: 116.5 ml LV vol d, MOD A4C: 70.8 ml LV vol s, MOD A2C: 73.5 ml LV vol s, MOD A4C: 54.3 ml LV SV MOD A2C:     43.0 ml LV SV MOD A4C:     70.8 ml LV SV MOD BP:      29.6 ml IVC IVC diam: 2.10 cm LEFT ATRIUM         Index LA diam:    3.40 cm 1.61 cm/m   AORTA Ao Root diam: 2.90 cm Ao Asc diam:  2.30 cm TRICUSPID VALVE TR Peak grad:   34.6 mmHg TR Vmax:        294.00 cm/s Donato Schultz MD Electronically signed by Donato Schultz MD Signature Date/Time: 05/25/2020/11:33:53 AM    Final    ECHOCARDIOGRAM LIMITED  Result Date: 05/23/2020    ECHOCARDIOGRAM REPORT   Patient Name:   Glenn [name]  Date of Exam: 05/23/2020 Medical Rec #:  [id number]          Height:       75.0 in Accession #:    [id number]         Weight:       230.0 lb Date of Birth:  2000/09/08          BSA:          2.329 m Patient Age:    20 years           BP:           114/68 mmHg Patient Gender: M                  HR:           127 bpm. Exam Location:  Inpatient Procedure: Limited Echo, Color Doppler and Cardiac Doppler STAT ECHO Indications:    I26.02 Pulmonary embolus  History:        Patient has no prior history of Echocardiogram examinations.                 Risk Factors:COVID+ 10/2019.  Sonographer:    Irving Burton Senior RDCS Referring Phys: [id number] Elenore Paddy REESE  Sonographer Comments: Suspected COVID at time of study, limited to assess RV strain and check for thrombus in setting of PE. Myrla Halsted MD present for exam. IMPRESSIONS  1. Left ventricular ejection fraction, by estimation, is 30 to 35%. The left ventricle has moderately decreased function. The left ventricle demonstrates global hypokinesis. Left ventricular

## 2020-05-29 NOTE — Plan of Care (Signed)
  Problem: Education: Goal: Knowledge of General Education information will improve Description: Including pain rating scale, medication(s)/side effects and non-pharmacologic comfort measures 05/29/2020 1343 by Durward Fortes, RN Outcome: Adequate for Discharge 05/29/2020 0738 by Durward Fortes, RN Outcome: Progressing   Problem: Health Behavior/Discharge Planning: Goal: Ability to manage health-related needs will improve 05/29/2020 1343 by Durward Fortes, RN Outcome: Adequate for Discharge 05/29/2020 0738 by Durward Fortes, RN Outcome: Progressing   Problem: Clinical Measurements: Goal: Ability to maintain clinical measurements within normal limits will improve 05/29/2020 1343 by Durward Fortes, RN Outcome: Adequate for Discharge 05/29/2020 0738 by Durward Fortes, RN Outcome: Progressing Goal: Will remain free from infection 05/29/2020 1343 by Durward Fortes, RN Outcome: Adequate for Discharge 05/29/2020 0738 by Durward Fortes, RN Outcome: Progressing Goal: Diagnostic test results will improve 05/29/2020 1343 by Durward Fortes, RN Outcome: Adequate for Discharge 05/29/2020 0738 by Durward Fortes, RN Outcome: Progressing Goal: Respiratory complications will improve 05/29/2020 1343 by Durward Fortes, RN Outcome: Adequate for Discharge 05/29/2020 0738 by Durward Fortes, RN Outcome: Progressing Goal: Cardiovascular complication will be avoided 05/29/2020 1343 by Durward Fortes, RN Outcome: Adequate for Discharge 05/29/2020 0738 by Durward Fortes, RN Outcome: Progressing   Problem: Activity: Goal: Risk for activity intolerance will decrease 05/29/2020 1343 by Durward Fortes, RN Outcome: Adequate for Discharge 05/29/2020 [phone] by Durward Fortes, RN Outcome: Progressing   Problem: Nutrition: Goal: Adequate nutrition will be maintained 05/29/2020 1343 by Durward Fortes, RN Outcome: Adequate for Discharge 05/29/2020 0738 by Durward Fortes, RN Outcome: Progressing   Problem: Coping: Goal: Level of anxiety  will decrease 05/29/2020 1343 by Durward Fortes, RN Outcome: Adequate for Discharge 05/29/2020 0738 by Durward Fortes, RN Outcome: Progressing   Problem: Elimination: Goal: Will not experience complications related to bowel motility 05/29/2020 1343 by Durward Fortes, RN Outcome: Adequate for Discharge 05/29/2020 0738 by Durward Fortes, RN Outcome: Progressing Goal: Will not experience complications related to urinary retention 05/29/2020 1343 by Durward Fortes, RN Outcome: Adequate for Discharge 05/29/2020 0738 by Durward Fortes, RN Outcome: Progressing   Problem: Pain Managment: Goal: General experience of comfort will improve 05/29/2020 1343 by Durward Fortes, RN Outcome: Adequate for Discharge 05/29/2020 0738 by Durward Fortes, RN Outcome: Progressing   Problem: Safety: Goal: Ability to remain free from injury will improve 05/29/2020 1343 by Durward Fortes, RN Outcome: Adequate for Discharge 05/29/2020 0738 by Durward Fortes, RN Outcome: Progressing   Problem: Skin Integrity: Goal: Risk for impaired skin integrity will decrease 05/29/2020 1343 by Durward Fortes, RN Outcome: Adequate for Discharge 05/29/2020 0738 by Durward Fortes, RN Outcome: Progressing

## 2020-05-29 NOTE — Plan of Care (Signed)
  Problem: Education: Goal: Knowledge of General Education information will improve Description: Including pain rating scale, medication(s)/side effects and non-pharmacologic comfort measures Outcome: Progressing   Problem: Activity: Goal: Risk for activity intolerance will decrease Outcome: Progressing   Problem: Nutrition: Goal: Adequate nutrition will be maintained Outcome: Progressing   

## 2020-05-29 NOTE — Plan of Care (Signed)

## 2020-05-30 ENCOUNTER — Telehealth (HOSPITAL_COMMUNITY): Payer: Self-pay | Admitting: Pharmacy Technician

## 2020-05-30 ENCOUNTER — Other Ambulatory Visit (HOSPITAL_COMMUNITY): Payer: Self-pay | Admitting: *Deleted

## 2020-05-30 LAB — DRVVT MIX: dRVVT Mix: 42.9 s — ABNORMAL HIGH (ref 0.0–40.4)

## 2020-05-30 LAB — DRVVT CONFIRM: dRVVT Confirm: 0.7 ratio — ABNORMAL LOW (ref 0.8–1.2)

## 2020-05-30 LAB — LUPUS ANTICOAGULANT PANEL
DRVVT: 49.2 s — ABNORMAL HIGH (ref 0.0–47.0)
PTT Lupus Anticoagulant: 42.1 s (ref 0.0–51.9)

## 2020-05-30 LAB — HOMOCYSTEINE: Homocysteine: 4.9 umol/L (ref 0.0–14.5)

## 2020-05-30 MED ORDER — IVABRADINE HCL 5 MG PO TABS: 5.0000 mg | ORAL_TABLET | Freq: Two times a day (BID) | ORAL | 3 refills | Status: DC

## 2020-05-30 NOTE — Telephone Encounter (Signed)
Advanced Heart Failure Patient Advocate Encounter  Prior Authorization for [place] has been submitted and approved.    PA# [id number] Effective dates: 05/29/20 through 05/29/21  Asked Jasmine (CMA), to [facility] day [facility]. [facility] is their preferred [facility]. They will not pay anything at our outpatient pharmacies.  Archer Asa, CPhT

## 2020-05-31 LAB — BETA-2-GLYCOPROTEIN I ABS, IGG/M/A
Beta-2 Glyco I IgG: <9 GPI IgG units (ref 0–20)
Beta-2-Glycoprotein I IgA: <9 GPI IgA units (ref 0–25)
Beta-2-Glycoprotein I IgM: <9 GPI IgM units (ref 0–32)

## 2020-05-31 LAB — CARDIOLIPIN ANTIBODIES, IGG, IGM, IGA
Anticardiolipin IgA: <9 APL U/mL (ref 0–11)
Anticardiolipin IgG: <9 GPL U/mL (ref 0–14)
Anticardiolipin IgM: <9 MPL U/mL (ref 0–12)

## 2020-06-05 NOTE — Progress Notes (Signed)
Advanced Heart Failure Clinic Note   PCP: Dr. Anna Genre Primary HF Cardiologist: Dr. Shirlee Latch  HPI: 20 y/o male w/ Crohn's disease, asthma, migraine headaches and COVID infection 10/2019, chronic biventricularr failure (new, ? Reverse Takotsubo), and PE.  Presented to ED 05/23/20 w/ complaints of SOB, cough and dizziness and found to have extensive acute bilateral PE w/ evidence of RV strain as well as moderate sized pericardial effusion. LE venous dopplers + for Lt sided DVT. 2D echo showed BiV dysfunction. LVEF 30-35% w/ global hypokensis. RV moderately enlarged w/ moderately reduced systolic function. Moderate TR. No prior studies for comparison.  Cardiology initially consulted. Given hemodynamic stability, there was no immediate indication for TPA nor pericardiocentesis. He was placed on IV heparin.   AHF team consulted given increasing volume overload and persistent tachycardia and hypotension. HR on admission was in the 140s but had improved down to the 110s. Had f/u limited echo and pericardial effusion seems slightly smaller. cMRI showed no myocardial LGE, so no definitive evidence for prior MI, infiltrative disease, or myocarditis. Diffuse HK towards the base and less towards the apex. ? Reverse Takotsubo pattern. Pericardial effusion on MRI felt to be moderate but no tamponade. He was discharged on GDMT and Eliquis. Discharge weight 179 lbs.  Today he returns for HF post-hospital follow up. Overall feeling fine, some SOB & fatigue with increased activity Denies increasing SOB, CP, dizziness, edema, or PND/Orthopnea. Appetite ok. No fever or chills. Does not check weight at home. Taking all medications.    ROS: All systems negative except as listed in HPI, PMH and Problem List.  SH:  Social History   Socioeconomic History  . Marital status: Single    Spouse name: Not on file  . Number of children: Not on file  . Years of education: Not on file  . Highest education level:  Not on file  Occupational History  . Not on file  Tobacco Use  . Smoking status: Former Smoker    Types: E-cigarettes    Quit date: 02/29/2020    Years since quitting: 0.2  . Smokeless tobacco: Never Used  Vaping Use  . Vaping Use: Some days  Substance and Sexual Activity  . Alcohol use: No  . Drug use: Never  . Sexual activity: Not on file  Other Topics Concern  . Not on file  Social History Narrative  . Not on file   Social Determinants of Health   Financial Resource Strain: Low Risk   . Difficulty of Paying Living Expenses: Not hard at all  Food Insecurity: No Food Insecurity  . Worried About Programme researcher, broadcasting/film/video in the Last Year: Never true  . Ran Out of Food in the Last Year: Never true  Transportation Needs: No Transportation Needs  . Lack of Transportation (Medical): No  . Lack of Transportation (Non-Medical): No  Physical Activity: Inactive  . Days of Exercise per Week: 0 days  . Minutes of Exercise per Session: 0 min  Stress: Not on file  Social Connections: Not on file  Intimate Partner Violence: Not on file    FH:  Family History  Problem Relation Age of Onset  . Seizures Cousin        Epilepsy    Past Medical History:  Diagnosis Date  . Asthma   . Crohn's disease (HCC) 05/23/2020   Diagnosed ~2019  . Deep vein thrombosis (DVT) of left lower extremity (HCC) 05/23/2020   L femoral, L popliteal veins 05/23/2020  .  Headache(784.0)   . Pericardial effusion 05/23/2020  . Pulmonary emboli (HCC) 05/23/2020   Bilateral with e/o R heart strain on CTA  . Sickle cell trait (HCC) 11-27-99    Current Outpatient Medications  Medication Sig Dispense Refill  . apixaban (ELIQUIS) 5 MG TABS tablet Take 1 tablet (5 mg total) by mouth 2 (two) times daily. 60 tablet 6  . ferrous sulfate 325 (65 FE) MG tablet Take 1 tablet (325 mg total) by mouth daily with breakfast. 30 tablet 3  . ivabradine (CORLANOR) 5 MG TABS tablet Take 1 tablet (5 mg total) by mouth 2 (two)  times daily with a meal. 180 tablet 3  . losartan (COZAAR) 25 MG tablet Take 1 tablet (25 mg total) by mouth daily. 30 tablet 0  . Mesalamine (ASACOL) 400 MG CPDR DR capsule Take 1 capsule (400 mg total) by mouth daily. 180 capsule 3  . ondansetron (ZOFRAN) 4 MG tablet Take 1 tablet (4 mg total) by mouth every 6 (six) hours. 12 tablet 0  . pantoprazole (PROTONIX) 40 MG tablet Take 1 tablet (40 mg total) by mouth daily. 30 tablet 0  . spironolactone (ALDACTONE) 25 MG tablet Take 0.5 tablets (12.5 mg total) by mouth daily. 30 tablet 3  . vitamin B-12 100 MCG tablet Take 1 tablet (100 mcg total) by mouth daily. 30 tablet 1  . apixaban (ELIQUIS) 5 MG TABS tablet Take 2 tablets (10 mg total) by mouth 2 (two) times daily for 1 day. 1 tablet 0   No current facility-administered medications for this encounter.   Vitals:   06/06/20 1207  BP: 130/82  Pulse: (!) 119  SpO2: 100%  Weight: 80.2 kg (176 lb 12.8 oz)  Height: 6\' 3"  (1.905 m)    PHYSICAL EXAM: General:  NAD. No resp difficulty HEENT: Normal Neck: Supple. No JVD. Carotids 2+ bilat; no bruits. No lymphadenopathy or thryomegaly appreciated. Cor: PMI nondisplaced. Regular rate & rhythm. No rubs, gallops or murmurs. Lungs: Clear Abdomen: Soft, nontender, nondistended. No hepatosplenomegaly. No bruits or masses. Good bowel sounds. Extremities: No cyanosis, clubbing, rash, edema Neuro: alert & oriented x 3, cranial nerves grossly intact. Moves all 4 extremities w/o difficulty. Affect pleasant.  ECG: ST 132 bpm (personally reviewed).  ASSESSMENT & PLAN: 1. History of submassive PE: Bilateral PEs on CTA, possible component of pulmonary infarction. DVT in left leg. 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 Has had recent Crohns flare, ?related to inflammation from Crohns.  - Now on Eliquis, think he will need long-term with no evident trigger for PE.   - No  plans for thrombolysis given clinical improvement and moderate pericardial effusion with some risk for hemorrhagic conversion.  - Hypercoagulable workup sent.  2. Pericardial effusion: Moderate on echo 2/25 without tamponade. Cause uncertain, ? related to inflammation from Crohns, ? due to large PE.  - Repeat limited echo 2/28 noted interval change, size of effusion small, felt moderate on cMRI but no evidence of tamponade.  3. Chronic biventricular failure:He has severe RV dysfunction with McConnell's sign and moderate RV enlargement, this is from the large PE. However, he also has decreased LV systolic function, EF appears to be around 30%. ?Pre-existing cardiomyopathy. H/o recent COVID infection however cMRI w/ no myocardial LGE, so no definitive evidence for myocarditis, prior MI or infiltrative disease. The pattern of wall motion abnormality with relatively preserved apical function and more hypokinetic towards the base raises  concern for stress cardiomyopathy variant (reverse Takotsubo).  - NYHA II, volume good today, weight stable. - Stop losartan. - Start Entresto 24/26 mg bid. BMET today, repeat in 10 days. - Continue spiro to 12.5 mg daily. - Increase ivabradine to 7.5 mg bid. HR 132 on ECG today. - Instructed to weigh daily. 4. Crohns disease: Recent flare symptomatically. He is on mesalamine, dose recently increased. Has GI follow up with Dr. Ewing Schlein soon. 5. Anemia: Hgb 8.5. Anemia panel c/w IDA - Continue Ferrous sulfate. - Given overall clinical picture of unprovoked DVT/PE, pericardial effusion, 70+ lb unintentional wt loss, unexplained IDA in a young male, Crohn's disease w/ higher risk for developing colorectal cancer, consider w/u to exclude malignancy.  - Has GI follow up with Dr. Ewing Schlein.  - Follow up in 3 weeks with PharmD for further medication titration and in 6 weeks with APP. - Will need repeat echo in 3 months (~6/22).  Prince Rome, FNP-BC 06/06/20

## 2020-06-06 ENCOUNTER — Other Ambulatory Visit: Payer: Self-pay

## 2020-06-06 ENCOUNTER — Telehealth (HOSPITAL_COMMUNITY): Payer: Self-pay | Admitting: Pharmacy Technician

## 2020-06-06 ENCOUNTER — Encounter (HOSPITAL_COMMUNITY): Payer: Self-pay

## 2020-06-06 ENCOUNTER — Ambulatory Visit (HOSPITAL_COMMUNITY)
Admit: 2020-06-06 | Discharge: 2020-06-06 | Disposition: A | Discharge status: Home / Self Care | Payer: BC Managed Care – PPO | Attending: Family Medicine | Admitting: Family Medicine

## 2020-06-06 VITALS — BP 130/82 | HR 119 | Ht 75.0 in | Wt 176.8 lb

## 2020-06-06 DIAGNOSIS — D649 Anemia, unspecified: Secondary | ICD-10-CM | POA: Diagnosis not present

## 2020-06-06 DIAGNOSIS — Z87891 Personal history of nicotine dependence: Secondary | ICD-10-CM | POA: Diagnosis not present

## 2020-06-06 DIAGNOSIS — I313 Pericardial effusion (noninflammatory): Secondary | ICD-10-CM | POA: Diagnosis not present

## 2020-06-06 DIAGNOSIS — Z86711 Personal history of pulmonary embolism: Secondary | ICD-10-CM | POA: Diagnosis not present

## 2020-06-06 DIAGNOSIS — K50919 Crohn's disease, unspecified, with unspecified complications: Secondary | ICD-10-CM

## 2020-06-06 DIAGNOSIS — I5082 Biventricular heart failure: Secondary | ICD-10-CM | POA: Diagnosis not present

## 2020-06-06 DIAGNOSIS — Z79899 Other long term (current) drug therapy: Secondary | ICD-10-CM | POA: Insufficient documentation

## 2020-06-06 DIAGNOSIS — Z86718 Personal history of other venous thrombosis and embolism: Secondary | ICD-10-CM | POA: Insufficient documentation

## 2020-06-06 DIAGNOSIS — D509 Iron deficiency anemia, unspecified: Secondary | ICD-10-CM

## 2020-06-06 DIAGNOSIS — I5022 Chronic systolic (congestive) heart failure: Secondary | ICD-10-CM | POA: Diagnosis not present

## 2020-06-06 DIAGNOSIS — Z7901 Long term (current) use of anticoagulants: Secondary | ICD-10-CM | POA: Insufficient documentation

## 2020-06-06 DIAGNOSIS — K5090 Crohn's disease, unspecified, without complications: Secondary | ICD-10-CM | POA: Diagnosis not present

## 2020-06-06 DIAGNOSIS — J45909 Unspecified asthma, uncomplicated: Secondary | ICD-10-CM | POA: Insufficient documentation

## 2020-06-06 DIAGNOSIS — I3139 Other pericardial effusion (noninflammatory): Secondary | ICD-10-CM

## 2020-06-06 LAB — CBC
HCT: 32.4 % — ABNORMAL LOW (ref 39.0–52.0)
Hemoglobin: 10.2 g/dL — ABNORMAL LOW (ref 13.0–17.0)
MCH: 22.1 pg — ABNORMAL LOW (ref 26.0–34.0)
MCHC: 31.5 g/dL (ref 30.0–36.0)
MCV: 70.1 fL — ABNORMAL LOW (ref 80.0–100.0)
Platelets: 459 10*3/uL — ABNORMAL HIGH (ref 150–400)
RBC: 4.62 MIL/uL (ref 4.22–5.81)
RDW: 21.1 % — ABNORMAL HIGH (ref 11.5–15.5)
WBC: 4.9 10*3/uL (ref 4.0–10.5)
nRBC: 0 % (ref 0.0–0.2)

## 2020-06-06 LAB — BASIC METABOLIC PANEL
Anion gap: 7 (ref 5–15)
BUN: 12 mg/dL (ref 6–20)
CO2: 26 mmol/L (ref 22–32)
Calcium: 7.8 mg/dL — ABNORMAL LOW (ref 8.9–10.3)
Chloride: 100 mmol/L (ref 98–111)
Creatinine, Ser: 1.02 mg/dL (ref 0.61–1.24)
GFR, Estimated: >60 mL/min (ref >60)
Glucose, Bld: 70 mg/dL (ref 70–99)
Potassium: 4 mmol/L (ref 3.5–5.1)
Sodium: 133 mmol/L — ABNORMAL LOW (ref 135–145)

## 2020-06-06 LAB — PROTHROMBIN GENE MUTATION

## 2020-06-06 MED ORDER — IVABRADINE HCL 7.5 MG PO TABS: 7.5000 mg | ORAL_TABLET | Freq: Two times a day (BID) | ORAL | 3 refills | Status: DC

## 2020-06-06 MED ORDER — SACUBITRIL-VALSARTAN 24-26 MG PO TABS: 1.0000 | ORAL_TABLET | Freq: Two times a day (BID) | ORAL | 3 refills | Status: DC

## 2020-06-06 NOTE — Patient Instructions (Addendum)
STOP Losartan  START Entresto 24/26mg  (1 tab) twice a day  INCREASE Ivabradine to 7.5mg  (1 tab) twice a day  Labs today We will only contact you if something comes back abnormal or we need to make some changes. Otherwise no news is good news!  Your physician recommends that you schedule a follow-up appointment in: 4 weeks with the Pharmacist and 6 weeks with the Nurse Practitioner  Please call office at [phone] option [phone] if you have any questions or concerns.   At the [facility], you and your health needs are our priority. As part of our continuing mission to provide you with exceptional heart care, we have created designated Provider Care Teams. These Care Teams include your primary Cardiologist (physician) and Advanced Practice Providers (APPs- Physician Assistants and Nurse Practitioners) who all work together to provide you with the care you need, when you need it.   You may see any of the following providers on your designated Care Team at your next follow up: Marland Kitchen Dr Arvilla Meres . Dr Marca Ancona . Dr Thornell Mule . Tonye Becket, NP . Robbie Lis, PA . Shanda Bumps [place],NP . Karle Plumber, PharmD   Please be sure to bring in all your medications bottles to every appointment.

## 2020-06-06 NOTE — Telephone Encounter (Signed)
Patient was seen in clinic today and I was consulted in regards to his [facility] co-pay. His insurance requires that he fills [facility] medications [facility]. I cannot see any accurate co-pays. Outside of [facility] the patient would have to pay the full price of the medication.  Ardith Dark, (FNP) with this information. Advised her to ensure the patient has a Pension scheme manager, Eliquis and Entresto co-pay card to optimize savings.  Archer Asa, CPhT

## 2020-06-07 ENCOUNTER — Other Ambulatory Visit (HOSPITAL_COMMUNITY): Payer: Self-pay | Admitting: *Deleted

## 2020-06-07 LAB — FACTOR 5 LEIDEN

## 2020-06-08 ENCOUNTER — Telehealth (HOSPITAL_COMMUNITY): Payer: Self-pay | Admitting: Family Medicine

## 2020-06-08 MED ORDER — IVABRADINE HCL 7.5 MG PO TABS: 7.5000 mg | ORAL_TABLET | Freq: Two times a day (BID) | ORAL | 3 refills | Status: DC

## 2020-06-08 NOTE — Telephone Encounter (Signed)
Pt stated that Ivabradine 7.5 MG TABS is not at [facility], it was suppose to be called in already, pt can be reached @[phone] . Please advise

## 2020-06-14 ENCOUNTER — Ambulatory Visit (HOSPITAL_COMMUNITY)
Admission: RE | Admit: 2020-06-14 | Discharge: 2020-06-14 | Disposition: A | Discharge status: Home / Self Care | Payer: BC Managed Care – PPO | Source: Ambulatory Visit | Attending: Internal Medicine | Admitting: Internal Medicine

## 2020-06-14 ENCOUNTER — Other Ambulatory Visit: Payer: Self-pay

## 2020-06-14 DIAGNOSIS — I5022 Chronic systolic (congestive) heart failure: Secondary | ICD-10-CM | POA: Diagnosis not present

## 2020-06-14 LAB — BASIC METABOLIC PANEL
Anion gap: 6 (ref 5–15)
BUN: 13 mg/dL (ref 6–20)
CO2: 26 mmol/L (ref 22–32)
Calcium: 7.3 mg/dL — ABNORMAL LOW (ref 8.9–10.3)
Chloride: 101 mmol/L (ref 98–111)
Creatinine, Ser: 1.01 mg/dL (ref 0.61–1.24)
GFR, Estimated: >60 mL/min (ref >60)
Glucose, Bld: 82 mg/dL (ref 70–99)
Potassium: 3.8 mmol/L (ref 3.5–5.1)
Sodium: 133 mmol/L — ABNORMAL LOW (ref 135–145)

## 2020-06-28 ENCOUNTER — Encounter (HOSPITAL_COMMUNITY): Payer: Self-pay

## 2020-06-29 ENCOUNTER — Telehealth (HOSPITAL_COMMUNITY): Payer: Self-pay | Admitting: Licensed Clinical Social Worker

## 2020-06-29 NOTE — Progress Notes (Signed)
Heart and Vascular Care Navigation  06/29/2020  Glenn [name] 03-06-2001 [id number]  Reason for Referral: Financial concerns   Engaged with patient by telephone for initial visit for Heart and Vascular Care Coordination.                                                                                                   Assessment:   CSW called pt to assess current financial concerns.  Pt states he has been unable to work since end of February when he was admitted to the hospital.  Lives with a roommate and pays $700 each in rent each month.  Was able to pay for March Rent and utilities but does not have the funds for April rent and is concerned as his short term disability application is still pending and he is worried it will take awhile to go through.  Pt reports he has sufficient food for now but has applied for food stamps at this time.                                  HRT/VAS Care Coordination    Patients Home Cardiology Office Heart Failure Clinic   Outpatient Care Team Social Worker   Social Worker Name: Rosetta Posner- [facility] Clinic- [phone]   Living arrangements for the past 2 months Single Family Home   Lives with: Roommate   Patient Current Optometrist   Patient Has Concern With Paying Medical Bills No   Does Patient Have Prescription Coverage? Yes   Home Assistive Devices/Equipment None      Social History:                                                                             SDOH Screenings   Alcohol Screen: Low Risk   . Last Alcohol Screening Score (AUDIT): 0  Depression (PHQ2-9): Low Risk   . PHQ-2 Score: 0  Financial Resource Strain: High Risk  . Difficulty of Paying Living Expenses: Hard  Food Insecurity: No Food Insecurity  . Worried About Programme researcher, broadcasting/film/video in the Last Year: Never true  . Ran Out of Food in the Last Year: Never true  Housing: Low Risk   . Last Housing Risk Score: 0  Physical Activity: Inactive  .  Days of Exercise per Week: 0 days  . Minutes of Exercise per Session: 0 min  Social Connections: Not on file  Stress: Not on file  Tobacco Use: Medium Risk  . Smoking Tobacco Use: Former Smoker  . Smokeless Tobacco Use: Never Used  Transportation Needs: No Transportation Needs  . Lack of Transportation (Medical): No  . Lack of Transportation (Non-Medical): No    SDOH Interventions: Financial Resources:  Financial Strain Interventions: Other (Comment) (referrals to local agencies and patient care fund)   Food Insecurity:   has applied for food stamps  Housing Insecurity:   no immediate concerns- has spoken to landlord regarding late rent and they are being flexible with him for now  Transportation:    no concerns   Follow-up plan:    CSW to follow up with pt next week regarding rent assistance.  Will continue to follow and assist as needed  Burna Sis, LCSW Clinical Social Worker Advanced [facility] Failure Clinic Desk#: [phone] Cell#: [phone]

## 2020-07-08 NOTE — Progress Notes (Incomplete)
***  In Progress*** PCP: Dr. Anna Genre Primary HF Cardiologist: Dr. Shirlee Latch  HPI:  20 y/o male w/ Crohn's disease, asthma, migraine headaches and COVID infection 10/2019, chronic biventricularr failure (new, ? Reverse Takotsubo), and PE.  Presented to ED 05/23/20 with complaints of SOB, cough and dizziness and found to have extensive acute bilateral PE w/ evidence of RV strain as well as moderate sized pericardial effusion. LE venous dopplers + for Lt sided DVT. 2D echo showed BiV dysfunction. LVEF 30-35% w/ global hypokensis. RV moderately enlarged w/ moderately reduced systolic function. Moderate TR. No prior studies for comparison. Cardiology initially consulted. Given hemodynamic stability, there was no immediate indication for TPA nor pericardiocentesis. He was placed on IV heparin.   AHF team consulted given increasing volume overload and persistent tachycardia and hypotension. HR on admission was in the 140s but had improved down to the 110s. Had f/u limited echo and pericardial effusion seems slightly smaller. cMRI showed nomyocardial LGE, so no definitive evidence for prior MI, infiltrative disease, or myocarditis.Diffuse HK towards the base and less towards the apex.? Reverse Takotsubo pattern. Pericardial effusion on MRI felt to be moderate but no tamponade. He was discharged on GDMT and Eliquis. Discharge weight 179 lbs.  He returned for HF post-hospital follow up on 06/06/20. Overall, was feeling fine, some SOB & fatigue with increased activity Denied increasing SOB, CP, dizziness, edema, or PND/Orthopnea. Appetite was ok. No fever or chills. Had not been checking weight at home. Was taking all medications.   Today he returns to HF clinic for pharmacist medication titration. At last visit with MD, losartan was stopped and Entresto 24/26 mg BID was initiated. Additionally, ivabradine was increased to 7.5 mg BID ***    Overall feeling ***. Dizziness, lightheadedness, fatigue:   Chest pain or palpitations:  How is your breathing?: *** SOB: Able to complete all ADLs. Activity level ***  Weight at home pounds. Takes furosemide/torsemide/bumex *** mg *** daily.  LEE PND/Orthopnea  Appetite *** Low-salt diet:   Physical Exam Cost/affordability of meds    -no labs today (last BMET 3/17) -incr Entresto to 49/51 mg BID, check BMET on 4/18 -start Farxiga 10 mg daily -f/u with APP on 4/18    HF Medications: Entresto 24/26 mg BID Spironolactone 25 mg daily Ivabradine 7.5 mg BID    Has the patient been experiencing any side effects to the medications prescribed?  {YES NO:22349}  Does the patient have any problems obtaining medications due to transportation or finances?   {YES NO:22349}  Understanding of regimen: {excellent/good/fair/poor:19665} Understanding of indications: {excellent/good/fair/poor:19665} Potential of compliance: {excellent/good/fair/poor:19665} Patient understands to avoid NSAIDs. Patient understands to avoid decongestants.    Pertinent Lab Values: . Serum creatinine ***, BUN ***, Potassium ***, Sodium ***, BNP ***, Magnesium ***, Digoxin ***   Vital Signs: . Weight: *** (last clinic weight: ***) . Blood pressure: ***  . Heart rate: ***   Assessment/Plan: 1. Chronic systolic CHF (EF ***), due to ***. NYHA class *** symptoms.   - Basic disease state pathophysiology, medication indication, mechanism and side effects reviewed at length with patient and he verbalized understanding    Glenn [name] (PharmD Candidate 2022)  Glenn [name], PharmD, BCPS, BCCP, CPP Heart Failure Clinic Pharmacist [phone]

## 2020-07-09 ENCOUNTER — Encounter (HOSPITAL_COMMUNITY): Payer: Self-pay

## 2020-07-09 ENCOUNTER — Other Ambulatory Visit: Payer: Self-pay

## 2020-07-09 ENCOUNTER — Ambulatory Visit (HOSPITAL_COMMUNITY)
Admission: RE | Admit: 2020-07-09 | Discharge: 2020-07-09 | Disposition: A | Discharge status: Home / Self Care | Payer: BC Managed Care – PPO | Source: Ambulatory Visit | Attending: Internal Medicine | Admitting: Internal Medicine

## 2020-07-09 VITALS — BP 100/60 | HR 145 | Wt 177.4 lb

## 2020-07-09 DIAGNOSIS — J45909 Unspecified asthma, uncomplicated: Secondary | ICD-10-CM | POA: Insufficient documentation

## 2020-07-09 DIAGNOSIS — Z597 Insufficient social insurance and welfare support: Secondary | ICD-10-CM | POA: Insufficient documentation

## 2020-07-09 DIAGNOSIS — Z86711 Personal history of pulmonary embolism: Secondary | ICD-10-CM | POA: Insufficient documentation

## 2020-07-09 DIAGNOSIS — K5090 Crohn's disease, unspecified, without complications: Secondary | ICD-10-CM | POA: Insufficient documentation

## 2020-07-09 DIAGNOSIS — Z733 Stress, not elsewhere classified: Secondary | ICD-10-CM | POA: Diagnosis not present

## 2020-07-09 DIAGNOSIS — Z79899 Other long term (current) drug therapy: Secondary | ICD-10-CM | POA: Diagnosis not present

## 2020-07-09 DIAGNOSIS — Z8616 Personal history of COVID-19: Secondary | ICD-10-CM | POA: Insufficient documentation

## 2020-07-09 DIAGNOSIS — I5082 Biventricular heart failure: Secondary | ICD-10-CM | POA: Insufficient documentation

## 2020-07-09 DIAGNOSIS — I5022 Chronic systolic (congestive) heart failure: Secondary | ICD-10-CM

## 2020-07-09 DIAGNOSIS — I82402 Acute embolism and thrombosis of unspecified deep veins of left lower extremity: Secondary | ICD-10-CM | POA: Insufficient documentation

## 2020-07-09 DIAGNOSIS — Z7901 Long term (current) use of anticoagulants: Secondary | ICD-10-CM | POA: Diagnosis not present

## 2020-07-09 DIAGNOSIS — I313 Pericardial effusion (noninflammatory): Secondary | ICD-10-CM | POA: Insufficient documentation

## 2020-07-09 DIAGNOSIS — D649 Anemia, unspecified: Secondary | ICD-10-CM | POA: Insufficient documentation

## 2020-07-09 MED ORDER — SPIRONOLACTONE 25 MG PO TABS: 25.0000 mg | ORAL_TABLET | Freq: Every day | ORAL | 11 refills | Status: DC

## 2020-07-09 NOTE — Progress Notes (Signed)
PCP: Dr. Anna Genre Primary HF Cardiologist: Dr. Shirlee Latch  HPI:  20 y/o male w/ Crohn's disease, asthma, migraine headaches and COVID infection 10/2019, chronic biventricular failure (new, questionable Reverse Takotsubo), and PE.  Presented to ED 05/23/20 with complaints of SOB, cough and dizziness and found to have extensive acute bilateral PE w/ evidence of RV strain as well as moderate sized pericardial effusion. LE venous dopplers positive for left sided DVT. 2D echo showed BiV dysfunction. LVEF 30-35% w/ global hypokensis. RV moderately enlarged w/ moderately reduced systolic function. Moderate TR. No prior studies for comparison.Cardiology initially consulted. Given hemodynamic stability, there was no immediate indication for TPA nor pericardiocentesis. He was placed on IV heparin.  AHF team consulted given increasing volume overload and persistent tachycardia and hypotension. HR on admission was in the 140s but had improved down to the 110s. Had f/u limited echo and pericardial effusion seemed slightly smaller. cMRI showed nomyocardial LGE, so no definitive evidence for prior MI, infiltrative disease, or myocarditis.Diffuse HK towards the base and less towards the apex.Questionable Reverse Takotsubo pattern. Pericardial effusion on MRI felt to be moderate but no tamponade. He was discharged on GDMT and Eliquis. Discharge weight 179 lbs.   He returned for HF post-hospital follow up on 06/06/20. Overall, was feeling fine, some SOB & fatigue with increased activity. Denied increasing SOB, CP, dizziness, edema, or PND/Orthopnea. Appetite was ok. No fever or chills. Had not been checking weight at home. Was taking all medications.   Today he returns to HF clinic for pharmacist medication titration. At last visit with MD, losartan was stopped and Entresto 24/26 mg BID was initiated. Additionally, ivabradine was increased to 7.5 mg BID. However, patient has not been able to fill his new ivabradine  prescription as it was sent to the wrong pharmacy. Patient has not taken ivabradine since his last visit on 06/06/20. Patient is taking all other meds. Overall, patient is feeling well. He reports a single episode of feeling sick to his stomach yesterday afternoon. He eventually vomited and felt better after some hydration. He is not sure what made him feel sick but thinks he may have eaten something bad. Otherwise, he denies dizziness, lightheadedness, fatigue, chest pain, or palpitations. Denies SOB, LEE, PND/orthopnea. He reports that his breathing is almost back to his baseline before hospitalization.  His home weight has been 175-183 lbs. Patient reports eating a balanced diet with fruits and vegetable, but admits that he continues to add salt to his food.  HF Medications: Entresto 24/26 mg BID Spironolactone 12.5 mg daily  Corlanor 7.5 mg BID - not taking, ran out of medication and prescription sent to wrong pharmacy.   Has the patient been experiencing any side effects to the medications prescribed?  No   Does the patient have any problems obtaining medications due to transportation or finances?   Yes, patient has Presenter, broadcasting. He has been able to activate his copay cards for Entresto and Eliquis. Reports recent ivabradine prescription was going to cost around $1800. Counseled patient to bring his physical copay card for ivabradine when he picks up his medications next. Instructed pharmacy to fill 30-day supply instead of 90-day supply.  Understanding of regimen: good Understanding of indications: good Potential of compliance: good Patient understands to avoid NSAIDs. Patient understands to avoid decongestants.  Pertinent Lab Values: . 06/14/20: Serum creatinine 1.01, BUN 13, Potassium 3.8  Vital Signs: . Weight: 177 lbs (last clinic weight: 176 lbs) . Blood pressure: 100/60  . Heart  rate: 145 - not taking his Corlanor  Assessment/Plan: 1. History of submassive PE:  Bilateral PEs on CTA, possible component of pulmonary infarction. DVT in left leg. 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 Has had recent Crohns flare, possibly related to inflammation from Crohns. - Now on Eliquis, may need long-term with no evident trigger for PE.  - No plans for thrombolysis given clinical improvement and moderate pericardial effusion with some risk for hemorrhagic conversion. - Hypercoagulable workup sent previously.  2. Pericardial effusion: Moderate on echo 2/25 without tamponade. Cause uncertain, possibly related to inflammation from Crohns, possibly due to large PE. - Repeat limited echo 05/28/20 noted interval change, size of effusion small, felt moderate on cMRI but no evidence of tamponade.  3. Chronic biventricular failure:He has severe RV dysfunction with McConnell's sign and moderate RV enlargement, this is from the large PE. However, he also has decreased LV systolic function, EF appears to be around 30%. Questionable pre-existing cardiomyopathy. H/o recent COVIDinfectionhowever cMRI w/ nomyocardial LGE, so no definitive evidence formyocarditis,prior MIor infiltrative disease. 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  - NYHA II, euvolemic today, weight stable. - Continue Entresto 24/26 mg BID. - Increase spironolactone to 25 mg daily. Check BMET at office visit in 1 week (07/16/20). - Restart ivabradine at 7.5 mg BID. HR 145 today. [facility] to ensure [facility]-day supply is ready for patient to pick up tomorrow. Patient has activated copay card and will bring copay card to pharmacy. - Continue to monitor daily weight at home. - Plans noted to repeat echo in about 2 months (~08/2020)   4. Crohns disease:  Recent flare symptomatically. He is on mesalamine, dose recently increased. Has GI follow up with Dr. Ewing Schlein soon.  5. Anemia: Hgb 8.5. Anemia panel c/w IDA - Continue Ferrous sulfate. - Given overall clinical picture of unprovoked DVT/PE, pericardial effusion, 70+ lb unintentional wt loss, unexplained IDA in a young male, Crohn's disease w/ higher risk for developing colorectal cancer, consider w/u to exclude malignancy.  - Has GI follow up with Dr. Ewing Schlein.  Patient will follow up with APP on 07/16/20 for office visit and BMET.    Lucina Mellow (PharmD Candidate 2022)  Tama Headings, PharmD, BCPS PGY2 Cardiology Pharmacy Resident  Karle Plumber, PharmD, BCPS, [facility], CPP Heart Failure Clinic Pharmacist [phone]

## 2020-07-09 NOTE — Patient Instructions (Signed)
It was a pleasure seeing you today!  MEDICATIONS: -We are changing your medications today -Increase spironolactone to 25 mg (1 tablet) daily -Restart ivabradine (Corlanor) 7.5 mg twice daily -Call if you have questions about your medications.  NEXT APPOINTMENT: Return to clinic in 1 week with APP Clinic.  In general, to take care of your heart failure: -Limit your fluid intake to 2 Liters (half-gallon) per day.   -Limit your salt intake to ideally 2-3 grams (2000-3000 mg) per day. -Weigh yourself daily and record, and bring that "weight diary" to your next appointment.  (Weight gain of 2-3 pounds in 1 day typically means fluid weight.) -The medications for your heart are to help your heart and help you live longer.   -Please contact us before stopping any of your heart medications.  Call the clinic at [phone] with questions or to reschedule future appointments.

## 2020-07-11 ENCOUNTER — Other Ambulatory Visit (HOSPITAL_COMMUNITY): Payer: Self-pay

## 2020-07-16 ENCOUNTER — Encounter (HOSPITAL_COMMUNITY): Payer: BC Managed Care – PPO

## 2020-07-16 NOTE — Progress Notes (Incomplete)
Advanced Heart Failure Clinic Note   PCP: Dr. Anna Genre Primary HF Cardiologist: Dr. Shirlee Latch  HPI: 20 y/o male w/ Crohn's disease, asthma, migraine headaches and COVID infection 10/2019, chronic biventricularr failure (new, ? Reverse Takotsubo), and PE.  Presented to ED 05/23/20 w/ complaints of SOB, cough and dizziness and found to have extensive acute bilateral PE w/ evidence of RV strain as well as moderate sized pericardial effusion. LE venous dopplers + for Lt sided DVT. 2D echo showed BiV dysfunction. LVEF 30-35% w/ global hypokensis. RV moderately enlarged w/ moderately reduced systolic function. Moderate TR. No prior studies for comparison.  Cardiology initially consulted. Given hemodynamic stability, there was no immediate indication for TPA nor pericardiocentesis. He was placed on IV heparin.   AHF team consulted given increasing volume overload and persistent tachycardia and hypotension. HR on admission was in the 140s but had improved down to the 110s. Had f/u limited echo and pericardial effusion seems slightly smaller. cMRI showed no myocardial LGE, so no definitive evidence for prior MI, infiltrative disease, or myocarditis. Diffuse HK towards the base and less towards the apex. ? Reverse Takotsubo pattern. Pericardial effusion on MRI felt to be moderate but no tamponade. He was discharged on GDMT and Eliquis. Discharge weight 179 lbs.  Today he returns for HF follow up. Last visit ivabradine was increased to 7.5 mg twice a day. Overall feeling fine. Denies SOB/PND/Orthopnea. Appetite ok. No fever or chills. Weight at home  pounds. Taking all medications   ROS: All systems negative except as listed in HPI, PMH and Problem List.  SH:  Social History   Socioeconomic History  . Marital status: Single    Spouse name: Not on file  . Number of children: Not on file  . Years of education: Not on file  . Highest education level: Not on file  Occupational History  . Not on  file  Tobacco Use  . Smoking status: Former Smoker    Types: E-cigarettes    Quit date: 02/29/2020    Years since quitting: 0.3  . Smokeless tobacco: Never Used  Vaping Use  . Vaping Use: Some days  Substance and Sexual Activity  . Alcohol use: No  . Drug use: Never  . Sexual activity: Not on file  Other Topics Concern  . Not on file  Social History Narrative  . Not on file   Social Determinants of Health   Financial Resource Strain: High Risk  . Difficulty of Paying Living Expenses: Hard  Food Insecurity: No Food Insecurity  . Worried About Programme researcher, broadcasting/film/video in the Last Year: Never true  . Ran Out of Food in the Last Year: Never true  Transportation Needs: No Transportation Needs  . Lack of Transportation (Medical): No  . Lack of Transportation (Non-Medical): No  Physical Activity: Inactive  . Days of Exercise per Week: 0 days  . Minutes of Exercise per Session: 0 min  Stress: Not on file  Social Connections: Not on file  Intimate Partner Violence: Not on file    FH:  Family History  Problem Relation Age of Onset  . Seizures Cousin        Epilepsy    Past Medical History:  Diagnosis Date  . Asthma   . Crohn's disease (HCC) 05/23/2020   Diagnosed ~2019  . Deep vein thrombosis (DVT) of left lower extremity (HCC) 05/23/2020   L femoral, L popliteal veins 05/23/2020  . Headache(784.0)   . Pericardial effusion 05/23/2020  .  Pulmonary emboli (HCC) 05/23/2020   Bilateral with e/o R heart strain on CTA  . Sickle cell trait (HCC) 07/09/99    Current Outpatient Medications  Medication Sig Dispense Refill  . apixaban (ELIQUIS) 5 MG TABS tablet Take 1 tablet (5 mg total) by mouth 2 (two) times daily. 60 tablet 6  . ferrous sulfate 325 (65 FE) MG tablet Take 1 tablet (325 mg total) by mouth daily with breakfast. 30 tablet 3  . ivabradine (CORLANOR) 7.5 MG TABS tablet Take 1 tablet (7.5 mg total) by mouth 2 (two) times daily with a meal. (Patient not taking: Reported  on 07/09/2020) 180 tablet 3  . Mesalamine (ASACOL) 400 MG CPDR DR capsule Take 1 capsule (400 mg total) by mouth daily. 180 capsule 3  . ondansetron (ZOFRAN) 4 MG tablet Take 1 tablet (4 mg total) by mouth every 6 (six) hours. 12 tablet 0  . pantoprazole (PROTONIX) 40 MG tablet Take 1 tablet (40 mg total) by mouth daily. 30 tablet 0  . sacubitril-valsartan (ENTRESTO) 24-26 MG Take 1 tablet by mouth 2 (two) times daily. 180 tablet 3  . spironolactone (ALDACTONE) 25 MG tablet Take 1 tablet (25 mg total) by mouth daily. 30 tablet 11   No current facility-administered medications for this visit.   There were no vitals filed for this visit.  PHYSICAL EXAM: General:  Well appearing. No resp difficulty HEENT: normal Neck: supple. no JVD. Carotids 2+ bilat; no bruits. No lymphadenopathy or thryomegaly appreciated. Cor: PMI nondisplaced. Regular rate & rhythm. No rubs, gallops or murmurs. Lungs: clear Abdomen: soft, nontender, nondistended. No hepatosplenomegaly. No bruits or masses. Good bowel sounds. Extremities: no cyanosis, clubbing, rash, edema Neuro: alert & orientedx3, cranial nerves grossly intact. moves all 4 extremities w/o difficulty. Affect pleasant   ECG:   ASSESSMENT & PLAN: 1. History of submassive PE: Bilateral PEs on CTA, possible component of pulmonary infarction. DVT in left leg. 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 Has had recent Crohns flare, ?related to inflammation from Crohns.  - Now on Eliquis, think he will need long-term with no evident trigger for PE.   - No plans for thrombolysis given clinical improvement and moderate pericardial effusion with some risk for hemorrhagic conversion.  - Hypercoagulable workup sent.  2. Pericardial effusion: Moderate on echo 2/25 without tamponade. Cause uncertain, ? related to inflammation from Crohns, ? due to large PE.  - Repeat limited echo 2/28  noted interval change, size of effusion small, felt moderate on cMRI but no evidence of tamponade.  3. Chronic biventricular failure:He has severe RV dysfunction with McConnell's sign and moderate RV enlargement, this is from the large PE. However, he also has decreased LV systolic function, EF appears to be around 30%. ?Pre-existing cardiomyopathy. H/o recent COVID infection however cMRI w/ no myocardial LGE, so no definitive evidence for myocarditis, prior MI or infiltrative disease. 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  - NYHA II, volume good today, weight stable. - Stop losartan. - Start Entresto 24/26 mg bid.  - Continue spiro to 12.5 mg daily. - Increase ivabradine to 7.5 mg bid.   4. Crohns disease: Recent flare symptomatically. He is on mesalamine, dose recently increased. Has GI follow up with Dr. Ewing Schlein soon. 5. Anemia: Hgb 8.5. Anemia panel c/w IDA - Continue Ferrous sulfate. -  Given overall clinical picture of unprovoked DVT/PE, pericardial effusion, 70+ lb unintentional wt loss, unexplained IDA in a young male, Crohn's disease w/ higher risk for developing colorectal cancer, consider w/u to exclude malignancy.  - Has GI follow up with Dr. Ewing Schlein.    Tonye Becket NP-C  11:54 AM  07/16/20

## 2020-07-26 ENCOUNTER — Telehealth: Payer: BC Managed Care – PPO | Admitting: Physician Assistant

## 2020-07-26 ENCOUNTER — Ambulatory Visit (HOSPITAL_COMMUNITY)
Admission: EM | Admit: 2020-07-26 | Discharge: 2020-07-26 | Disposition: A | Discharge status: Home / Self Care | Payer: BC Managed Care – PPO | Attending: Internal Medicine | Admitting: Internal Medicine

## 2020-07-26 ENCOUNTER — Other Ambulatory Visit: Payer: Self-pay

## 2020-07-26 ENCOUNTER — Encounter (HOSPITAL_COMMUNITY): Payer: Self-pay | Admitting: Emergency Medicine

## 2020-07-26 DIAGNOSIS — G43009 Migraine without aura, not intractable, without status migrainosus: Secondary | ICD-10-CM | POA: Diagnosis not present

## 2020-07-26 DIAGNOSIS — Z7901 Long term (current) use of anticoagulants: Secondary | ICD-10-CM

## 2020-07-26 DIAGNOSIS — R112 Nausea with vomiting, unspecified: Secondary | ICD-10-CM

## 2020-07-26 DIAGNOSIS — S0990XA Unspecified injury of head, initial encounter: Secondary | ICD-10-CM

## 2020-07-26 MED ORDER — ONDANSETRON 4 MG PO TBDP: 4.0000 mg | ORAL_TABLET | Freq: Three times a day (TID) | ORAL | 0 refills | Status: DC | PRN

## 2020-07-26 MED ORDER — SUMATRIPTAN SUCCINATE 25 MG PO TABS: 25.0000 mg | ORAL_TABLET | Freq: Once | ORAL | 0 refills | Status: AC

## 2020-07-26 NOTE — Progress Notes (Signed)
Based on what you shared with me, I feel your condition warrants further evaluation and I recommend that you be seen in a face to face office visit. Giving use of blood thinning agents, if you have had a head or abdominal injury prior to onset of the vomiting you are having, you need to be seen in person, preferably at a local ER, for full evaluation and acute management. Please do not delay care.     NOTE: If you entered your credit card information for this eVisit, you will not be charged. You may see a "hold" on your card for the $35 but that hold will drop off and you will not have a charge processed.   If you are having a true medical emergency please call 911.      For an urgent face to face visit, [name] has six urgent care centers for your convenience:     [facility] at [facility] Directions [phone] [address] [place], [place] [address] . 8 am - 4 pm Monday - Friday    [facility]) Get Driving Directions [phone] [address] [place], [place] [address] . 8 am to 8 pm Monday-Friday . 10 am to 6 pm [facility]) Get Driving Directions [phone]  [address] [place],  [place]  [address] . 8 am to 8 pm Monday-Friday . 8 am to 4 pm [facility] at [facility] Get Driving Directions [phone] [address] [place], [place] [address] . 8 am to 8 pm Monday-Friday . 8 am to 4 pm [facility] at [facility] Get Driving Directions  [phone] [address] [place], [place] [address] . 8 am to 8 pm Monday-Friday . 8 am to 4 pm [facility]gent [facility] at [facility] Directions [phone] [address], [address] [place], [place] [address] . 8 am to 8 pm Monday-Friday . 8  am to 4 pm Saturday-Sunday     Your MyChart E-visit questionnaire answers were reviewed by a board certified advanced clinical practitioner to complete your personal care plan based on your specific symptoms.  Thank you for using e-Visits.

## 2020-07-26 NOTE — ED Provider Notes (Signed)
[facility]    CSN: [id number] Arrival date & time: 07/26/20  1117      History   Chief Complaint Chief Complaint  Patient presents with  . Emesis    HPI Glenn [name] is a 20 y.o. male with a history of pulmonary embolism currently on anticoagulation comes to urgent care with headaches, vomiting and hot flashes since Monday.  Headache is currently about 2 out of 10.  Patient feels the headache is generalized with no aggravating or relieving factors.  He denies any floaters in his visual fields.  No light sensitivity.  It is associated with vomiting.  Vomitus is nonbilious nonbloody.  No change in bowel movements.  Patient's oral intake is decreased over the past few weeks.  No falls or head trauma.  Patient has a history of migraines and has been managed with Imitrex in the past.  No fever or chills.  Patient also has a history of Crohn's disease being managed with ivabradine.   HPI  Past Medical History:  Diagnosis Date  . Asthma   . Crohn's disease (HCC) 05/23/2020   Diagnosed ~2019  . Deep vein thrombosis (DVT) of left lower extremity (HCC) 05/23/2020   L femoral, L popliteal veins 05/23/2020  . Headache(784.0)   . Pericardial effusion 05/23/2020  . Pulmonary emboli (HCC) 05/23/2020   Bilateral with e/o R heart strain on CTA  . Sickle cell trait (HCC) 01/17/00    Patient Active Problem List   Diagnosis Date Noted  . Acute systolic CHF (congestive heart failure) (HCC)   . Pulmonary emboli (HCC) 05/23/2020  . Crohn's disease (HCC) 05/23/2020  . Pericardial effusion 05/23/2020  . Deep vein thrombosis (DVT) of left lower extremity (HCC) 05/23/2020  . Abdominal pain 10/15/2017  . Perforated appendicitis 10/01/2017  . Migraine without aura and with status migrainosus, not intractable 10/19/2012  . ADHD (attention deficit hyperactivity disorder) 10/19/2012  . 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 CIRCUMCISION         Home Medications    Prior to Admission medications   Medication Sig Start Date End Date Taking? Authorizing Provider  apixaban (ELIQUIS) 5 MG TABS tablet Take 1 tablet (5 mg total) by mouth 2 (two) times daily. 05/30/20  Yes Regalado, Belkys A, MD  ferrous sulfate 325 (65 FE) MG tablet Take 1 tablet (325 mg total) by mouth daily with breakfast. 05/30/20  Yes Regalado, Belkys A, MD  ivabradine (CORLANOR) 7.5 MG TABS tablet Take 1 tablet (7.5 mg total) by mouth 2 (two) times daily with a meal. 06/08/20  Yes Milford, Beaver, FNP  Mesalamine (ASACOL) 400 MG CPDR DR capsule Take 1 capsule (400 mg total) by mouth daily. 05/30/20  Yes Regalado, Belkys A, MD  ondansetron (ZOFRAN ODT) 4 MG disintegrating tablet Take 1 tablet (4 mg total) by mouth every 8 (eight) hours as needed for nausea or vomiting. 07/26/20  Yes Sanaya Gwilliam, Britta Mccreedy, MD  pantoprazole (PROTONIX) 40 MG tablet Take 1 tablet (40 mg total) by mouth daily. 05/30/20  Yes Regalado, Belkys A, MD  sacubitril-valsartan (ENTRESTO) 24-26 MG Take 1 tablet by mouth 2 (two) times daily. 06/06/20  Yes Milford, Anderson Malta, FNP  spironolactone (ALDACTONE) 25 MG tablet Take 1 tablet (25 mg total) by mouth daily. 07/09/20  Yes Laurey Morale, MD  SUMAtriptan (IMITREX) 25 MG tablet Take 1 tablet (25 mg total) by mouth once for  1 dose. May repeat in 2 hours if headache persists or recurs. 07/26/20 07/26/20 Yes Chasten Blaze, Britta Mccreedy, MD  ondansetron (ZOFRAN) 4 MG tablet Take 1 tablet (4 mg total) by mouth every 6 (six) hours. 05/29/20   Regalado, Prentiss Bells, MD    Family History Family History  Problem Relation Age of Onset  . Seizures Cousin        Epilepsy    Social History Social History   Tobacco Use  . Smoking status: Former Smoker    Quit date: 02/29/2020    Years since quitting: 0.4  . Smokeless tobacco: Never Used  Vaping Use  . Vaping Use: Former  Substance Use Topics  . Alcohol use: No  . Drug use:  Never     Allergies   Penicillins and Cefdinir   Review of Systems Review of Systems  Constitutional: Negative.   HENT: Negative.   Gastrointestinal: Positive for nausea and vomiting. Negative for blood in stool, constipation and diarrhea.  Genitourinary: Negative.   Neurological: Positive for headaches. Negative for dizziness and light-headedness.  Psychiatric/Behavioral: Negative for confusion.     Physical Exam Triage Vital Signs ED Triage Vitals  Enc Vitals Group     BP 07/26/20 1313 111/67     Pulse Rate 07/26/20 1313 93     Resp 07/26/20 1313 18     Temp 07/26/20 1313 97.9 F (36.6 C)     Temp Source 07/26/20 1313 Oral     SpO2 07/26/20 1313 100 %     Weight --      Height --      Head Circumference --      Peak Flow --      Pain Score 07/26/20 1310 2     Pain Loc --      Pain Edu? --      Excl. in GC? --    No data found.  Updated Vital Signs BP 111/67 (BP Location: Left Arm)   Pulse 93   Temp 97.9 F (36.6 C) (Oral)   Resp 18   SpO2 100%   Visual Acuity Right Eye Distance:   Left Eye Distance:   Bilateral Distance:    Right Eye Near:   Left Eye Near:    Bilateral Near:     Physical Exam Vitals and nursing note reviewed.  Constitutional:      General: He is in acute distress.     Appearance: He is not ill-appearing.  HENT:     Mouth/Throat:     Mouth: Mucous membranes are moist.  Cardiovascular:     Rate and Rhythm: Normal rate and regular rhythm.     Pulses: Normal pulses.     Heart sounds: Normal heart sounds.  Pulmonary:     Effort: Pulmonary effort is normal.     Breath sounds: Normal breath sounds.  Abdominal:     General: Bowel sounds are normal.     Palpations: Abdomen is soft.  Musculoskeletal:        General: Normal range of motion.      UC Treatments / Results  Labs (all labs ordered are listed, but only abnormal results are displayed) Labs Reviewed - No data to display  EKG   Radiology No results  found.  Procedures Procedures (including critical care time)  Medications Ordered in UC Medications - No data to display  Initial Impression / Assessment and Plan / UC Course  I have reviewed the triage vital signs and the nursing notes.  Pertinent labs & imaging results that were available during my care of the patient were reviewed by me and considered in my medical decision making (see chart for details).     1.  Headaches likely migraines: Imitrex as needed for headaches I could not do IM medications because patient is on anticoagulation Zofran as needed for nausea/vomiting Increase oral fluid intake If patient's symptoms worsens he is advised to go to the ED.  He may need CT scan of the brain if headache is persistent. Final Clinical Impressions(s) / UC Diagnoses   Final diagnoses:  Migraine without aura and without status migrainosus, not intractable     Discharge Instructions     Please take medications as prescribed If you experience worsening headaches, confusion,persistent nausea-please go to the emergency department to be evaluated further Increase oral fluid intake to less than 2070ml/day    ED Prescriptions    Medication Sig Dispense Auth. Provider   ondansetron (ZOFRAN ODT) 4 MG disintegrating tablet Take 1 tablet (4 mg total) by mouth every 8 (eight) hours as needed for nausea or vomiting. 20 tablet Karolynn Infantino, Britta Mccreedy, MD   SUMAtriptan (IMITREX) 25 MG tablet Take 1 tablet (25 mg total) by mouth once for 1 dose. May repeat in 2 hours if headache persists or recurs. 20 tablet Laurana Magistro, Britta Mccreedy, MD     PDMP not reviewed this encounter.   Merrilee Jansky, MD 07/26/20 [phone]

## 2020-07-26 NOTE — Discharge Instructions (Addendum)
Please take medications as prescribed If you experience worsening headaches, confusion,persistent nausea-please go to the emergency department to be evaluated further Increase oral fluid intake to less than 208ml/day

## 2020-07-26 NOTE — ED Triage Notes (Signed)
Started feeling sick on Monday, hot flashes, headache and vomiting.  Vomiting started 2 weeks ago, intermittent-crohns disease.  states vomiting pattern is no different than baseline.

## 2020-08-17 ENCOUNTER — Telehealth (HOSPITAL_COMMUNITY): Payer: Self-pay | Admitting: *Deleted

## 2020-08-17 NOTE — Telephone Encounter (Signed)
Pt left VM stating his feet were swollen and he can see "blood vessels" in his legs. Pt said he was without spironolactone for a few days. I called pt back to get more information no answer/left vm for pt to return my call.

## 2020-08-18 ENCOUNTER — Emergency Department (HOSPITAL_COMMUNITY): Payer: BC Managed Care – PPO

## 2020-08-18 ENCOUNTER — Encounter (HOSPITAL_COMMUNITY): Payer: Self-pay | Admitting: *Deleted

## 2020-08-18 ENCOUNTER — Emergency Department (HOSPITAL_COMMUNITY)
Admission: EM | Admit: 2020-08-18 | Discharge: 2020-08-18 | Disposition: A | Discharge status: Home / Self Care | Payer: BC Managed Care – PPO | Attending: Emergency Medicine | Admitting: Emergency Medicine

## 2020-08-18 ENCOUNTER — Other Ambulatory Visit: Payer: Self-pay

## 2020-08-18 DIAGNOSIS — Z79899 Other long term (current) drug therapy: Secondary | ICD-10-CM | POA: Insufficient documentation

## 2020-08-18 DIAGNOSIS — Z86711 Personal history of pulmonary embolism: Secondary | ICD-10-CM | POA: Insufficient documentation

## 2020-08-18 DIAGNOSIS — Z87891 Personal history of nicotine dependence: Secondary | ICD-10-CM | POA: Diagnosis not present

## 2020-08-18 DIAGNOSIS — Z86718 Personal history of other venous thrombosis and embolism: Secondary | ICD-10-CM | POA: Diagnosis not present

## 2020-08-18 DIAGNOSIS — R2243 Localized swelling, mass and lump, lower limb, bilateral: Secondary | ICD-10-CM | POA: Insufficient documentation

## 2020-08-18 DIAGNOSIS — I5021 Acute systolic (congestive) heart failure: Secondary | ICD-10-CM | POA: Diagnosis not present

## 2020-08-18 DIAGNOSIS — R0602 Shortness of breath: Secondary | ICD-10-CM | POA: Insufficient documentation

## 2020-08-18 DIAGNOSIS — Z7901 Long term (current) use of anticoagulants: Secondary | ICD-10-CM | POA: Diagnosis not present

## 2020-08-18 DIAGNOSIS — R21 Rash and other nonspecific skin eruption: Secondary | ICD-10-CM | POA: Diagnosis not present

## 2020-08-18 DIAGNOSIS — R600 Localized edema: Secondary | ICD-10-CM

## 2020-08-18 LAB — CBC
HCT: 28.5 % — ABNORMAL LOW (ref 39.0–52.0)
Hemoglobin: 9.1 g/dL — ABNORMAL LOW (ref 13.0–17.0)
MCH: 26.8 pg (ref 26.0–34.0)
MCHC: 31.9 g/dL (ref 30.0–36.0)
MCV: 83.8 fL (ref 80.0–100.0)
Platelets: 374 10*3/uL (ref 150–400)
RBC: 3.4 MIL/uL — ABNORMAL LOW (ref 4.22–5.81)
RDW: 19.8 % — ABNORMAL HIGH (ref 11.5–15.5)
WBC: 3.4 10*3/uL — ABNORMAL LOW (ref 4.0–10.5)
nRBC: 0 % (ref 0.0–0.2)

## 2020-08-18 LAB — BASIC METABOLIC PANEL
Anion gap: 5 (ref 5–15)
BUN: 12 mg/dL (ref 6–20)
CO2: 25 mmol/L (ref 22–32)
Calcium: 6.7 mg/dL — ABNORMAL LOW (ref 8.9–10.3)
Chloride: 105 mmol/L (ref 98–111)
Creatinine, Ser: 1.13 mg/dL (ref 0.61–1.24)
GFR, Estimated: >60 mL/min (ref >60)
Glucose, Bld: 119 mg/dL — ABNORMAL HIGH (ref 70–99)
Potassium: 3.5 mmol/L (ref 3.5–5.1)
Sodium: 135 mmol/L (ref 135–145)

## 2020-08-18 LAB — TROPONIN I (HIGH SENSITIVITY)
Troponin I (High Sensitivity): 4 ng/L (ref <18)
Troponin I (High Sensitivity): <2 ng/L (ref <18)

## 2020-08-18 LAB — BRAIN NATRIURETIC PEPTIDE: B Natriuretic Peptide: 44.5 pg/mL (ref 0.0–100.0)

## 2020-08-18 MED ORDER — FUROSEMIDE 10 MG/ML IJ SOLN
20.0000 mg | Freq: Once | INTRAMUSCULAR | Status: AC
  Administered 2020-08-18: 20 mg via INTRAVENOUS
  Filled 2020-08-18: qty 2

## 2020-08-18 MED ORDER — CALCIUM CARBONATE ANTACID 500 MG PO CHEW: 2.0000 | CHEWABLE_TABLET | Freq: Every day | ORAL | 0 refills | Status: AC

## 2020-08-18 MED ORDER — FUROSEMIDE 20 MG PO TABS: 20.0000 mg | ORAL_TABLET | Freq: Every day | ORAL | 0 refills | Status: DC

## 2020-08-18 MED ORDER — FUROSEMIDE 20 MG PO TABS: 20.0000 mg | ORAL_TABLET | Freq: Once | ORAL | Status: DC

## 2020-08-18 NOTE — ED Provider Notes (Signed)
[facility] EMERGENCY DEPARTMENT Provider Note   CSN: [id number] Arrival date & time: 08/18/20  1100     History Chief Complaint  Patient presents with  . Leg Swelling  . Shortness of Breath    Glenn [name] is a 20 y.o. male.  HPI      Glenn [name] is a 20 y.o. male, with a history of asthma, Crohn's disease, DVT, PE, CHF, presenting to the ED with bilateral lower extremity edema worsening for the last week.  Patient states he has been compliant with his spironolactone until he ran out about a week and a half ago.  A few days after that, he noticed his lower extremity edema began to worsen.  He started back on the spironolactone a few days ago, however, his lower extremity edema has continued to worsen. He also notes a rash and puffiness to his face bilaterally for the last 4 months. He denies fever/chills, abdominal pain, chest pain, increased shortness of breath, orthopnea, or any other complaints.  Patient states he is scheduled to follow-up with cardiology May 31.  Past Medical History:  Diagnosis Date  . Asthma   . Crohn's disease (HCC) 05/23/2020   Diagnosed ~2019  . Deep vein thrombosis (DVT) of left lower extremity (HCC) 05/23/2020   L femoral, L popliteal veins 05/23/2020  . Headache(784.0)   . Pericardial effusion 05/23/2020  . Pulmonary emboli (HCC) 05/23/2020   Bilateral with e/o R heart strain on CTA  . Sickle cell trait (HCC) 05/05/1999    Patient Active Problem List   Diagnosis Date Noted  . Acute systolic CHF (congestive heart failure) (HCC)   . Pulmonary emboli (HCC) 05/23/2020  . Crohn's disease (HCC) 05/23/2020  . Pericardial effusion 05/23/2020  . Deep vein thrombosis (DVT) of left lower extremity (HCC) 05/23/2020  . Abdominal pain 10/15/2017  . Perforated appendicitis 10/01/2017  . Migraine without aura and with status migrainosus, not intractable 10/19/2012  . ADHD (attention deficit hyperactivity disorder)  10/19/2012  . 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 CIRCUMCISION         Family History  Problem Relation Age of Onset  . Seizures Cousin        Epilepsy    Social History   Tobacco Use  . Smoking status: Former Smoker    Quit date: 02/29/2020    Years since quitting: 0.4  . Smokeless tobacco: Never Used  Vaping Use  . Vaping Use: Former  Substance Use Topics  . Alcohol use: No  . Drug use: Never    Home Medications Prior to Admission medications   Medication Sig Start Date End Date Taking? Authorizing Provider  apixaban (ELIQUIS) 5 MG TABS tablet Take 1 tablet (5 mg total) by mouth 2 (two) times daily. 05/30/20  Yes Regalado, Belkys A, MD  calcium carbonate (TUMS) 500 MG chewable tablet Chew 2 tablets (400 mg of elemental calcium total) by mouth daily for 5 days. 08/18/20 08/23/20 Yes Keyvon Herter C, PA-C  ferrous sulfate 325 (65 FE) MG tablet Take 1 tablet (325 mg total) by mouth daily with breakfast. 05/30/20  Yes Regalado, Belkys A, MD  furosemide (LASIX) 20 MG tablet Take 1 tablet (20 mg total) by mouth daily for 5 days. 08/18/20 08/23/20 Yes Manroop Jakubowicz C, PA-C  ivabradine (CORLANOR) 7.5 MG TABS tablet Take 1 tablet (7.5 mg total) by mouth 2 (two) times daily  with a meal. 06/08/20  Yes Milford, Anderson Malta, FNP  Mesalamine (ASACOL) 400 MG CPDR DR capsule Take 1 capsule (400 mg total) by mouth daily. 05/30/20  Yes Regalado, Belkys A, MD  ondansetron (ZOFRAN ODT) 4 MG disintegrating tablet Take 1 tablet (4 mg total) by mouth every 8 (eight) hours as needed for nausea or vomiting. 07/26/20  Yes Lamptey, Britta Mccreedy, MD  pantoprazole (PROTONIX) 40 MG tablet Take 1 tablet (40 mg total) by mouth daily. 05/30/20  Yes Regalado, Belkys A, MD  sacubitril-valsartan (ENTRESTO) 24-26 MG Take 1 tablet by mouth 2 (two) times daily. 06/06/20  Yes Milford, Anderson Malta, FNP  spironolactone (ALDACTONE) 25 MG tablet Take 1  tablet (25 mg total) by mouth daily. 07/09/20  Yes Laurey Morale, MD  SUMAtriptan (IMITREX) 25 MG tablet Take 1 tablet (25 mg total) by mouth once for 1 dose. May repeat in 2 hours if headache persists or recurs. 07/26/20 07/26/20 Yes Lamptey, Britta Mccreedy, MD  ondansetron (ZOFRAN) 4 MG tablet Take 1 tablet (4 mg total) by mouth every 6 (six) hours. Patient not taking: Reported on 08/18/2020 05/29/20   Hartley Barefoot A, MD    Allergies    Penicillins, Amoxicillin, and Cefdinir  Review of Systems   Review of Systems  Constitutional: Negative for chills, diaphoresis and fever.  Respiratory: Negative for cough and shortness of breath.   Cardiovascular: Positive for leg swelling. Negative for chest pain.  Gastrointestinal: Negative for abdominal pain, diarrhea, nausea and vomiting.  Genitourinary: Negative for decreased urine volume, difficulty urinating and dysuria.  Musculoskeletal: Negative for back pain.  Skin: Positive for rash.  Neurological: Negative for dizziness, syncope, weakness and numbness.  All other systems reviewed and are negative.   Physical Exam Updated Vital Signs BP 120/74   Pulse (!) 107   Temp 98.5 F (36.9 C) (Oral)   Resp 16   Ht 6\' 4"  (1.93 m)   Wt 81.6 kg   SpO2 100%   BMI 21.91 kg/m   Physical Exam Vitals and nursing note reviewed.  Constitutional:      General: He is not in acute distress.    Appearance: He is well-developed. He is not diaphoretic.  HENT:     Head: Normocephalic and atraumatic.     Mouth/Throat:     Mouth: Mucous membranes are moist.     Pharynx: Oropharynx is clear.  Eyes:     Conjunctiva/sclera: Conjunctivae normal.  Cardiovascular:     Rate and Rhythm: Normal rate and regular rhythm.     Pulses: Normal pulses.          Radial pulses are 2+ on the right side and 2+ on the left side.       Dorsalis pedis pulses are 2+ on the right side and 2+ on the left side.     Heart sounds: Normal heart sounds.     Comments: Tactile  temperature in the extremities appropriate and equal bilaterally. Pulmonary:     Effort: Pulmonary effort is normal. No respiratory distress.     Breath sounds: Normal breath sounds.     Comments: No increased work of breathing.  Speaks in full sentences without difficulty.  Patient also noted to be able to lie supine or lateral recumbent without difficulty or signs of discomfort. Abdominal:     Palpations: Abdomen is soft.     Tenderness: There is no abdominal tenderness. There is no guarding.  Musculoskeletal:     Cervical back: Neck supple.  Right lower leg: 2+ Pitting Edema present.     Left lower leg: 2+ Pitting Edema present.     Comments: Bilateral lower extremities are edematous with some tenderness throughout.  Edema seems to extend at least to the knees bilaterally.  Skin:    General: Skin is warm and dry.  Neurological:     Mental Status: He is alert.  Psychiatric:        Mood and Affect: Mood and affect normal.        Speech: Speech normal.        Behavior: Behavior normal.     ED Results / Procedures / Treatments   Labs (all labs ordered are listed, but only abnormal results are displayed) Labs Reviewed  BASIC METABOLIC PANEL - Abnormal; Notable for the following components:      Result Value   Glucose, Bld 119 (*)    Calcium 6.7 (*)    All other components within normal limits  CBC - Abnormal; Notable for the following components:   WBC 3.4 (*)    RBC 3.40 (*)    Hemoglobin 9.1 (*)    HCT 28.5 (*)    RDW 19.8 (*)    All other components within normal limits  BRAIN NATRIURETIC PEPTIDE  TROPONIN I (HIGH SENSITIVITY)  TROPONIN I (HIGH SENSITIVITY)   Hemoglobin  Date Value Ref Range Status  08/18/2020 9.1 (L) 13.0 - 17.0 g/dL Final  40/98/1191 47.8 (L) 13.0 - 17.0 g/dL Final  29/56/2130 8.5 (L) 13.0 - 17.0 g/dL Final    Comment:    Reticulocyte Hemoglobin testing may be clinically indicated, consider ordering this additional test [id number]    05/28/2020 8.3 (L) 13.0 - 17.0 g/dL Final    Comment:    Reticulocyte Hemoglobin testing may be clinically indicated, consider ordering this additional test [id number]     EKG EKG Interpretation  Date/Time:  Saturday Aug 18 2020 11:18:18 EDT Ventricular Rate:  105 PR Interval:  124 QRS Duration: 72 QT Interval:  302 QTC Calculation: 399 R Axis:   62 Text Interpretation: Sinus tachycardia Otherwise normal ECG No significant change from March 2022 ecg Confirmed by Alvester Chou [phone]) on 08/18/2020 11:40:26 AM   Radiology DG Chest 2 View  Result Date: 08/18/2020 CLINICAL DATA:  Shortness of breath. EXAM: CHEST - 2 VIEW COMPARISON:  05/23/2020 FINDINGS: The cardiomediastinal silhouette is unremarkable. There is no evidence of focal airspace disease, pulmonary edema, suspicious pulmonary nodule/mass, pleural effusion, or pneumothorax. No acute bony abnormalities are identified. IMPRESSION: No active cardiopulmonary disease. Electronically Signed   By: Harmon Pier M.D.   On: 08/18/2020 12:01    Procedures Procedures   Medications Ordered in ED Medications  furosemide (LASIX) injection 20 mg (20 mg Intravenous Given 08/18/20 1532)    ED Course  I have reviewed the triage vital signs and the nursing notes.  Pertinent labs & imaging results that were available during my care of the patient were reviewed by me and considered in my medical decision making (see chart for details).    MDM Rules/Calculators/A&P                          Patient presents with bilateral lower extremity edema with onset after stopping his spironolactone. Patient is nontoxic appearing, afebrile, not tachycardic on my exam, not tachypneic, not hypotensive, maintains excellent SPO2 on room air, and is in no apparent distress.   I have reviewed the patient's chart to obtain more  information.   I reviewed and interpreted the patient's labs and radiological studies. Delta troponins negative. Chest  x-ray, BNP, renal function unremarkable.  He does have some decrease in his calcium, which was addressed as an outpatient.  Patient will follow-up on this with a PCP. We will give the patient a few days of Lasix.  He will need to follow-up with the heart failure clinic.  The patient was given instructions for home care as well as return precautions. Patient voices understanding of these instructions, accepts the plan, and is comfortable with discharge.  Findings and plan of care discussed with attending physician, Marguarite Arbour, MD.    Vitals:   08/18/20 1430 08/18/20 1500 08/18/20 1528 08/18/20 1530  BP: 113/61 113/71 105/75 105/75  Pulse: 67 70 79 74  Resp: 20 14 16 16   Temp:   98.9 F (37.2 C)   TempSrc:      SpO2: 99% 100% 100% 99%  Weight:      Height:         Final Clinical Impression(s) / ED Diagnoses Final diagnoses:  Bilateral lower extremity edema    Rx / DC Orders ED Discharge Orders         Ordered    furosemide (LASIX) 20 MG tablet  Daily        08/18/20 1519    calcium carbonate (TUMS) 500 MG chewable tablet  Daily        08/18/20 1519           Anselm Pancoast, PA-C 08/19/20 1008    Terald Sleeper, MD 08/19/20 [phone]

## 2020-08-18 NOTE — Discharge Instructions (Addendum)
Take the Lasix once daily for the next 5 days. Be sure to have your potassium retested after this course. Take the calcium supplementation, as prescribed, for the next 5 days.  Have your calcium retested after this course. Follow-up with the heart failure clinic on this matter.

## 2020-08-18 NOTE — ED Notes (Signed)
Got patient on the monitor patient is resting with call bell in reach  ?

## 2020-08-18 NOTE — ED Triage Notes (Signed)
Pt states hx of PE and "fluid around his heart".  States sob and LE edema for several days.  Also c/o rash on face.  BIL LE edema and facial swelling noted.

## 2020-08-22 ENCOUNTER — Telehealth (HOSPITAL_COMMUNITY): Payer: Self-pay | Admitting: Licensed Clinical Social Worker

## 2020-08-22 NOTE — Telephone Encounter (Signed)
Check received from accounts payable to help with $500 of pt past due rent through patient care fund.  Check mailed to address provided by pt landlord and pt and landlord informed it is being mailed out today.  Will continue to follow and assist as needed  Burna Sis, LCSW Clinical Social Worker [facility] Desk#: [phone] Cell#: [phone]

## 2020-08-26 ENCOUNTER — Encounter (HOSPITAL_COMMUNITY): Payer: Self-pay | Admitting: Emergency Medicine

## 2020-08-26 ENCOUNTER — Emergency Department (HOSPITAL_COMMUNITY): Payer: BC Managed Care – PPO

## 2020-08-26 ENCOUNTER — Inpatient Hospital Stay (HOSPITAL_COMMUNITY)
Admission: EM | Admit: 2020-08-26 | Discharge: 2020-08-30 | DRG: 385 | Disposition: A | Discharge status: Home / Self Care | Payer: BC Managed Care – PPO | Attending: Internal Medicine | Admitting: Internal Medicine

## 2020-08-26 DIAGNOSIS — R601 Generalized edema: Secondary | ICD-10-CM | POA: Diagnosis not present

## 2020-08-26 DIAGNOSIS — Z79899 Other long term (current) drug therapy: Secondary | ICD-10-CM | POA: Diagnosis not present

## 2020-08-26 DIAGNOSIS — E8809 Other disorders of plasma-protein metabolism, not elsewhere classified: Secondary | ICD-10-CM | POA: Diagnosis present

## 2020-08-26 DIAGNOSIS — E876 Hypokalemia: Secondary | ICD-10-CM | POA: Diagnosis present

## 2020-08-26 DIAGNOSIS — Z820 Family history of epilepsy and other diseases of the nervous system: Secondary | ICD-10-CM

## 2020-08-26 DIAGNOSIS — J45909 Unspecified asthma, uncomplicated: Secondary | ICD-10-CM | POA: Diagnosis present

## 2020-08-26 DIAGNOSIS — I959 Hypotension, unspecified: Secondary | ICD-10-CM | POA: Diagnosis present

## 2020-08-26 DIAGNOSIS — G43909 Migraine, unspecified, not intractable, without status migrainosus: Secondary | ICD-10-CM | POA: Diagnosis present

## 2020-08-26 DIAGNOSIS — I5023 Acute on chronic systolic (congestive) heart failure: Secondary | ICD-10-CM | POA: Diagnosis not present

## 2020-08-26 DIAGNOSIS — Z8616 Personal history of COVID-19: Secondary | ICD-10-CM

## 2020-08-26 DIAGNOSIS — I313 Pericardial effusion (noninflammatory): Secondary | ICD-10-CM | POA: Diagnosis present

## 2020-08-26 DIAGNOSIS — I5181 Takotsubo syndrome: Secondary | ICD-10-CM | POA: Diagnosis present

## 2020-08-26 DIAGNOSIS — I5020 Unspecified systolic (congestive) heart failure: Secondary | ICD-10-CM | POA: Diagnosis present

## 2020-08-26 DIAGNOSIS — R000 Tachycardia, unspecified: Secondary | ICD-10-CM | POA: Diagnosis present

## 2020-08-26 DIAGNOSIS — R609 Edema, unspecified: Secondary | ICD-10-CM | POA: Diagnosis not present

## 2020-08-26 DIAGNOSIS — I5082 Biventricular heart failure: Secondary | ICD-10-CM | POA: Diagnosis present

## 2020-08-26 DIAGNOSIS — E43 Unspecified severe protein-calorie malnutrition: Secondary | ICD-10-CM | POA: Diagnosis not present

## 2020-08-26 DIAGNOSIS — Z20822 Contact with and (suspected) exposure to covid-19: Secondary | ICD-10-CM | POA: Diagnosis present

## 2020-08-26 DIAGNOSIS — Z87891 Personal history of nicotine dependence: Secondary | ICD-10-CM | POA: Diagnosis not present

## 2020-08-26 DIAGNOSIS — I5043 Acute on chronic combined systolic (congestive) and diastolic (congestive) heart failure: Secondary | ICD-10-CM | POA: Diagnosis present

## 2020-08-26 DIAGNOSIS — E871 Hypo-osmolality and hyponatremia: Secondary | ICD-10-CM | POA: Diagnosis present

## 2020-08-26 DIAGNOSIS — Z7901 Long term (current) use of anticoagulants: Secondary | ICD-10-CM

## 2020-08-26 DIAGNOSIS — M7989 Other specified soft tissue disorders: Secondary | ICD-10-CM | POA: Diagnosis present

## 2020-08-26 DIAGNOSIS — I50813 Acute on chronic right heart failure: Secondary | ICD-10-CM | POA: Diagnosis not present

## 2020-08-26 DIAGNOSIS — I509 Heart failure, unspecified: Secondary | ICD-10-CM | POA: Diagnosis not present

## 2020-08-26 DIAGNOSIS — Z86718 Personal history of other venous thrombosis and embolism: Secondary | ICD-10-CM | POA: Diagnosis not present

## 2020-08-26 DIAGNOSIS — I5021 Acute systolic (congestive) heart failure: Secondary | ICD-10-CM

## 2020-08-26 DIAGNOSIS — K9049 Malabsorption due to intolerance, not elsewhere classified: Secondary | ICD-10-CM | POA: Diagnosis not present

## 2020-08-26 DIAGNOSIS — D573 Sickle-cell trait: Secondary | ICD-10-CM | POA: Diagnosis present

## 2020-08-26 DIAGNOSIS — K5090 Crohn's disease, unspecified, without complications: Secondary | ICD-10-CM | POA: Diagnosis present

## 2020-08-26 DIAGNOSIS — Z86711 Personal history of pulmonary embolism: Secondary | ICD-10-CM | POA: Diagnosis not present

## 2020-08-26 DIAGNOSIS — R600 Localized edema: Secondary | ICD-10-CM | POA: Diagnosis not present

## 2020-08-26 LAB — BRAIN NATRIURETIC PEPTIDE: B Natriuretic Peptide: 53.1 pg/mL (ref 0.0–100.0)

## 2020-08-26 LAB — CBC WITH DIFFERENTIAL/PLATELET
Abs Immature Granulocytes: 0 10*3/uL (ref 0.00–0.07)
Basophils Absolute: 0 10*3/uL (ref 0.0–0.1)
Basophils Relative: 0 %
Eosinophils Absolute: 0 10*3/uL (ref 0.0–0.5)
Eosinophils Relative: 0 %
HCT: 26.4 % — ABNORMAL LOW (ref 39.0–52.0)
Hemoglobin: 8.7 g/dL — ABNORMAL LOW (ref 13.0–17.0)
Lymphocytes Relative: 7 %
Lymphs Abs: 0.3 10*3/uL — ABNORMAL LOW (ref 0.7–4.0)
MCH: 27.4 pg (ref 26.0–34.0)
MCHC: 33 g/dL (ref 30.0–36.0)
MCV: 83.3 fL (ref 80.0–100.0)
Monocytes Absolute: 0 10*3/uL — ABNORMAL LOW (ref 0.1–1.0)
Monocytes Relative: 1 %
Neutro Abs: 3.7 10*3/uL (ref 1.7–7.7)
Neutrophils Relative %: 92 %
Platelets: 399 10*3/uL (ref 150–400)
RBC: 3.17 MIL/uL — ABNORMAL LOW (ref 4.22–5.81)
RDW: 18.2 % — ABNORMAL HIGH (ref 11.5–15.5)
WBC: 4 10*3/uL (ref 4.0–10.5)
nRBC: 0 % (ref 0.0–0.2)
nRBC: 3 /100 WBC — ABNORMAL HIGH

## 2020-08-26 LAB — COMPREHENSIVE METABOLIC PANEL
ALT: 39 U/L (ref 0–44)
AST: 21 U/L (ref 15–41)
Albumin: <1 g/dL — ABNORMAL LOW (ref 3.5–5.0)
Alkaline Phosphatase: 92 U/L (ref 38–126)
Anion gap: 6 (ref 5–15)
BUN: 11 mg/dL (ref 6–20)
CO2: 25 mmol/L (ref 22–32)
Calcium: 6.2 mg/dL — CL (ref 8.9–10.3)
Chloride: 98 mmol/L (ref 98–111)
Creatinine, Ser: 1.19 mg/dL (ref 0.61–1.24)
GFR, Estimated: >60 mL/min (ref >60)
Glucose, Bld: 95 mg/dL (ref 70–99)
Potassium: 2.7 mmol/L — CL (ref 3.5–5.1)
Sodium: 129 mmol/L — ABNORMAL LOW (ref 135–145)
Total Bilirubin: 0.2 mg/dL — ABNORMAL LOW (ref 0.3–1.2)
Total Protein: 3 g/dL — ABNORMAL LOW (ref 6.5–8.1)

## 2020-08-26 LAB — RETICULOCYTES
Immature Retic Fract: 10.2 % (ref 2.3–15.9)
RBC.: 3.3 MIL/uL — ABNORMAL LOW (ref 4.22–5.81)
Retic Count, Absolute: 58.1 10*3/uL (ref 19.0–186.0)
Retic Ct Pct: 1.8 % (ref 0.4–3.1)

## 2020-08-26 LAB — TROPONIN I (HIGH SENSITIVITY)
Troponin I (High Sensitivity): 3 ng/L (ref <18)
Troponin I (High Sensitivity): 3 ng/L (ref <18)

## 2020-08-26 LAB — RESP PANEL BY RT-PCR (FLU A&B, COVID) ARPGX2
Influenza A by PCR: NEGATIVE
Influenza B by PCR: NEGATIVE
SARS Coronavirus 2 by RT PCR: NEGATIVE

## 2020-08-26 LAB — FOLATE: Folate: 13 ng/mL (ref >5.9)

## 2020-08-26 LAB — MAGNESIUM: Magnesium: 1.7 mg/dL (ref 1.7–2.4)

## 2020-08-26 MED ORDER — SODIUM CHLORIDE 0.9% FLUSH
3.0000 mL | Freq: Two times a day (BID) | INTRAVENOUS | Status: DC
  Administered 2020-08-27 – 2020-08-30 (×5): 3 mL via INTRAVENOUS

## 2020-08-26 MED ORDER — ONDANSETRON HCL 4 MG/2ML IJ SOLN
4.0000 mg | Freq: Four times a day (QID) | INTRAMUSCULAR | Status: DC | PRN
  Administered 2020-08-28 – 2020-08-30 (×2): 4 mg via INTRAVENOUS
  Filled 2020-08-26 (×2): qty 2

## 2020-08-26 MED ORDER — SPIRONOLACTONE 25 MG PO TABS
25.0000 mg | ORAL_TABLET | Freq: Every day | ORAL | Status: DC
  Administered 2020-08-27 – 2020-08-30 (×4): 25 mg via ORAL
  Filled 2020-08-26 (×4): qty 1

## 2020-08-26 MED ORDER — SODIUM CHLORIDE 0.9% FLUSH: 3.0000 mL | INTRAVENOUS | Status: DC | PRN

## 2020-08-26 MED ORDER — APIXABAN 5 MG PO TABS
5.0000 mg | ORAL_TABLET | Freq: Two times a day (BID) | ORAL | Status: DC
  Administered 2020-08-26 – 2020-08-30 (×8): 5 mg via ORAL
  Filled 2020-08-26 (×8): qty 1

## 2020-08-26 MED ORDER — PANTOPRAZOLE SODIUM 40 MG PO TBEC
40.0000 mg | DELAYED_RELEASE_TABLET | Freq: Every day | ORAL | Status: DC
  Administered 2020-08-27 – 2020-08-30 (×4): 40 mg via ORAL
  Filled 2020-08-26 (×4): qty 1

## 2020-08-26 MED ORDER — SODIUM CHLORIDE 0.9 % IV SOLN: 250.0000 mL | INTRAVENOUS | Status: DC | PRN

## 2020-08-26 MED ORDER — MESALAMINE 400 MG PO CPDR
400.0000 mg | DELAYED_RELEASE_CAPSULE | Freq: Every day | ORAL | Status: DC
  Administered 2020-08-27 – 2020-08-30 (×4): 400 mg via ORAL
  Filled 2020-08-26 (×4): qty 1

## 2020-08-26 MED ORDER — POTASSIUM CHLORIDE 10 MEQ/100ML IV SOLN
10.0000 meq | INTRAVENOUS | Status: AC
  Administered 2020-08-26 (×3): 10 meq via INTRAVENOUS
  Filled 2020-08-26 (×3): qty 100

## 2020-08-26 MED ORDER — POTASSIUM CHLORIDE CRYS ER 20 MEQ PO TBCR
40.0000 meq | EXTENDED_RELEASE_TABLET | Freq: Once | ORAL | Status: AC
  Administered 2020-08-26: 40 meq via ORAL
  Filled 2020-08-26: qty 2

## 2020-08-26 MED ORDER — FUROSEMIDE 10 MG/ML IJ SOLN
40.0000 mg | Freq: Once | INTRAMUSCULAR | Status: AC
  Administered 2020-08-27: 40 mg via INTRAVENOUS
  Filled 2020-08-26: qty 4

## 2020-08-26 MED ORDER — IVABRADINE HCL 7.5 MG PO TABS
7.5000 mg | ORAL_TABLET | Freq: Two times a day (BID) | ORAL | Status: DC
  Administered 2020-08-27 – 2020-08-30 (×7): 7.5 mg via ORAL
  Filled 2020-08-26 (×8): qty 1

## 2020-08-26 MED ORDER — ACETAMINOPHEN 325 MG PO TABS: 650.0000 mg | ORAL_TABLET | ORAL | Status: DC | PRN

## 2020-08-26 MED ORDER — SACUBITRIL-VALSARTAN 24-26 MG PO TABS
1.0000 | ORAL_TABLET | Freq: Two times a day (BID) | ORAL | Status: DC
  Administered 2020-08-26 – 2020-08-30 (×8): 1 via ORAL
  Filled 2020-08-26 (×8): qty 1

## 2020-08-26 NOTE — ED Triage Notes (Signed)
Reports bilateral lower leg pain and swelling since February.  Seen in ED last weekend for same.  Denies SOB.

## 2020-08-26 NOTE — ED Notes (Signed)
Critical labs results- Potassium 2.7 and Calcium 6.2.  Trudee Grip, PA notified of lab results.

## 2020-08-26 NOTE — ED Provider Notes (Signed)
[facility] EMERGENCY DEPARTMENT Provider Note   CSN: [id number] Arrival date & time: 08/26/20  5621     History No chief complaint on file. cc: edema  TRUST LEH III is a 20 y.o. male with a past medical history of Crohn's disease, complicated by DVT with PE with pericardial effusion and CHF in February of this year who presents today for evaluation of leg swelling. He was seen in the emergency room recently as he had run out of his spironolactone and was having increased leg swelling. He states that he is now taking it and has been compliant with it and all of his medications including his anticoagulation however his feet are continuing to swell.  He states that they are red and weeping causing him significant pain. He denies any new chest pain or shortness of breath. He denies any fevers or known recent sick contacts.  HPI     Past Medical History:  Diagnosis Date  . Asthma   . Crohn's disease (HCC) 05/23/2020   Diagnosed ~2019  . Deep vein thrombosis (DVT) of left lower extremity (HCC) 05/23/2020   L femoral, L popliteal veins 05/23/2020  . Headache(784.0)   . Pericardial effusion 05/23/2020  . Pulmonary emboli (HCC) 05/23/2020   Bilateral with e/o R heart strain on CTA  . Sickle cell trait (HCC) 1999/04/13    Patient Active Problem List   Diagnosis Date Noted  . Acute systolic CHF (congestive heart failure) (HCC)   . Pulmonary emboli (HCC) 05/23/2020  . Crohn's disease (HCC) 05/23/2020  . Pericardial effusion 05/23/2020  . Deep vein thrombosis (DVT) of left lower extremity (HCC) 05/23/2020  . Abdominal pain 10/15/2017  . Perforated appendicitis 10/01/2017  . Migraine without aura and with status migrainosus, not intractable 10/19/2012  . ADHD (attention deficit hyperactivity disorder) 10/19/2012  . 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 CIRCUMCISION          Family History  Problem Relation Age of Onset  . Seizures Cousin        Epilepsy    Social History   Tobacco Use  . Smoking status: Former Smoker    Quit date: 02/29/2020    Years since quitting: 0.4  . Smokeless tobacco: Never Used  Vaping Use  . Vaping Use: Former  Substance Use Topics  . Alcohol use: No  . Drug use: Never    Home Medications Prior to Admission medications   Medication Sig Start Date End Date Taking? Authorizing Provider  apixaban (ELIQUIS) 5 MG TABS tablet Take 1 tablet (5 mg total) by mouth 2 (two) times daily. 05/30/20   Regalado, Jon Billings A, MD  ferrous sulfate 325 (65 FE) MG tablet Take 1 tablet (325 mg total) by mouth daily with breakfast. 05/30/20   Regalado, Belkys A, MD  furosemide (LASIX) 20 MG tablet Take 1 tablet (20 mg total) by mouth daily for 5 days. 08/18/20 08/23/20  Joy, Hillard Danker, PA-C  ivabradine (CORLANOR) 7.5 MG TABS tablet Take 1 tablet (7.5 mg total) by mouth 2 (two) times daily with a meal. 06/08/20   Milford, Anderson Malta, FNP  Mesalamine (ASACOL) 400 MG CPDR DR capsule Take 1 capsule (400 mg total) by mouth daily. 05/30/20   Regalado, Belkys A, MD  ondansetron (ZOFRAN ODT) 4 MG disintegrating tablet Take 1 tablet (4 mg total) by mouth every 8 (eight) hours as needed for nausea or vomiting. 07/26/20  Merrilee Jansky, MD  ondansetron (ZOFRAN) 4 MG tablet Take 1 tablet (4 mg total) by mouth every 6 (six) hours. Patient not taking: Reported on 08/18/2020 05/29/20   Regalado, Jon Billings A, MD  pantoprazole (PROTONIX) 40 MG tablet Take 1 tablet (40 mg total) by mouth daily. 05/30/20   Regalado, Belkys A, MD  sacubitril-valsartan (ENTRESTO) 24-26 MG Take 1 tablet by mouth 2 (two) times daily. 06/06/20   Jacklynn Ganong, FNP  spironolactone (ALDACTONE) 25 MG tablet Take 1 tablet (25 mg total) by mouth daily. 07/09/20   Laurey Morale, MD  SUMAtriptan (IMITREX) 25 MG tablet Take 1 tablet (25 mg total) by mouth once for 1 dose. May repeat in 2 hours if  headache persists or recurs. 07/26/20 07/26/20  Merrilee Jansky, MD    Allergies    Penicillins, Amoxicillin, and Cefdinir  Review of Systems   Review of Systems  Constitutional: Negative for chills and fever.  Respiratory: Positive for shortness of breath (Unchanged from baseline).   Cardiovascular: Positive for leg swelling. Negative for chest pain.  Skin: Positive for color change.  Neurological: Negative for weakness and headaches.  All other systems reviewed and are negative.   Physical Exam Updated Vital Signs BP 126/68   Pulse 89   Temp 98.5 F (36.9 C) (Oral)   Resp 17   SpO2 100%   Physical Exam Vitals and nursing note reviewed.  Constitutional:      General: He is not in acute distress.    Appearance: He is not diaphoretic.  HENT:     Head: Normocephalic and atraumatic.  Eyes:     General: No scleral icterus.       Right eye: No discharge.        Left eye: No discharge.     Conjunctiva/sclera: Conjunctivae normal.  Cardiovascular:     Rate and Rhythm: Normal rate and regular rhythm.     Pulses: Normal pulses.     Heart sounds: Normal heart sounds.  Pulmonary:     Effort: Pulmonary effort is normal. No respiratory distress.     Breath sounds: Normal breath sounds. No stridor.  Abdominal:     General: There is no distension.  Musculoskeletal:        General: No deformity.     Cervical back: Normal range of motion and neck supple.     Comments: There is significant edema 4+ in the feet.  This tracks up to the mid back above the umbilicus.  Skin:    General: Skin is warm and dry.     Comments: There is erythema to the tops of bilateral feet with small skin breaks.  There is no active weeping.  Neurological:     Mental Status: He is alert.     Motor: No abnormal muscle tone.     Comments: Patient is awake and alert, answers sugars questions appropriately.  Speech is not slurred.  Psychiatric:        Mood and Affect: Mood normal.        Behavior:  Behavior normal.     ED Results / Procedures / Treatments   Labs (all labs ordered are listed, but only abnormal results are displayed) Labs Reviewed  CBC WITH DIFFERENTIAL/PLATELET - Abnormal; Notable for the following components:      Result Value   RBC 3.17 (*)    Hemoglobin 8.7 (*)    HCT 26.4 (*)    RDW 18.2 (*)    Lymphs Abs 0.3 (*)  Monocytes Absolute 0.0 (*)    nRBC 3 (*)    All other components within normal limits  COMPREHENSIVE METABOLIC PANEL - Abnormal; Notable for the following components:   Sodium 129 (*)    Potassium 2.7 (*)    Calcium 6.2 (*)    Total Protein 3.0 (*)    Albumin <1.0 (*)    Total Bilirubin 0.2 (*)    All other components within normal limits  BRAIN NATRIURETIC PEPTIDE  URINALYSIS, ROUTINE W REFLEX MICROSCOPIC  MAGNESIUM  VITAMIN B12  FOLATE  IRON AND TIBC  FERRITIN  RETICULOCYTES  TROPONIN I (HIGH SENSITIVITY)  TROPONIN I (HIGH SENSITIVITY)    EKG None  Radiology DG Chest Portable 1 View  Result Date: 08/26/2020 CLINICAL DATA:  Bilateral foot swelling, initial encounter EXAM: PORTABLE CHEST 1 VIEW COMPARISON:  08/18/2020 FINDINGS: The heart size and mediastinal contours are within normal limits. Both lungs are clear. The visualized skeletal structures are unremarkable. IMPRESSION: No active disease. Electronically Signed   By: Alcide Clever M.D.   On: 08/26/2020 17:13    Procedures Procedures   Medications Ordered in ED Medications  potassium chloride SA (KLOR-CON) CR tablet 40 mEq (has no administration in time range)  potassium chloride 10 mEq in 100 mL IVPB (has no administration in time range)    ED Course  I have reviewed the triage vital signs and the nursing notes.  Pertinent labs & imaging results that were available during my care of the patient were reviewed by me and considered in my medical decision making (see chart for details).  Clinical Course as of 08/26/20 1955  Wynelle Link Aug 26, 2020  1949 I spoke with  Cardiology Dr. Santiago Glad who will see patient for admission consideration.  [EH]    Clinical Course User Index [EH] Norman Clay   MDM Rules/Calculators/A&P                         Patient is a 20 year old man with a past medical history of CHF secondary to DVT/PE and pericardial effusion who presents today for evaluation of worsening feet swelling. On exam he has significant edema of his bilateral lower extremities and anasarca extending above the level of his umbilicus primarily on his back. Today he is hypokalemic with a potassium of 2.7.  His calcium has also dropped significantly and is now at 6.2.  He continues to be anemic with a hemoglobin of 8.7 however this does not appear to be overly acute as 8 days ago he was 9.1. Anemia panel is sent. Given the degree of his hypokalemia both p.o. and IV potassium is ordered.  Magnesium level is ordered.  Chest x-ray without acute abnormalities. Given the degree of his edema and anasarca with the combination of hypokalemia I do not think that patient would be suitable for high-dose Lasix in the ER and discharge as that would exacerbate his underlying electrolyte abnormalities. I spoke with cardiology who will see the patient anticipating admission.  Patient is agreeable for admission.  The patient appears reasonably stabilized for admission considering the current resources, flow, and capabilities available in the ED at this time, and I doubt any other [facility] requiring further screening and/or treatment in the ED prior to admission assuming timely admission and bed placement.  Note: Portions of this report may have been transcribed using voice recognition software. Every effort was made to ensure accuracy; however, inadvertent computerized transcription errors may be present   Final  Clinical Impression(s) / ED Diagnoses Final diagnoses:  Generalized edema  Acute systolic CHF (congestive heart failure) [facility])    Rx / DC Orders ED  Discharge Orders    None       Norman Clay 08/26/20 2014    Sabino Donovan, MD 08/26/20 [phone]

## 2020-08-26 NOTE — ED Provider Notes (Signed)
Emergency Medicine Provider Triage Evaluation Note  Glenn [name] , a 20 y.o. male  was evaluated in triage.  Pt complains of bilateral lower extremity edema which has been worsening over the last week.  Patient was seen here on 5/21 with leg swelling after stopping spironolactone.  He was started on Lasix and sent home to follow-up with PCP.  He did not follow-up with his PCP, however he has been compliant with his Lasix.  He continues to have worsening leg swelling which has been extending up to his knees.  He states it hurts to walk and he can no longer put shoes on.  He denies any chest pain or shortness of breath.  He is on Eliquis for history of PE and DVT and has been compliant with this.  Review of Systems  Positive: As above Negative: As above  Physical Exam  There were no vitals taken for this visit. Gen:   Awake, no distress   Resp:  Normal effort  MSK:   Moves extremities without difficulty Other:  2+ bilateral lower extremity edema extending up to the knees  Medical Decision Making  Medically screening exam initiated at 4:12 PM.  Appropriate orders placed.  Glenn [name] was informed that the remainder of the evaluation will be completed by another provider, this initial triage assessment does not replace that evaluation, and the importance of remaining in the ED until their evaluation is complete.    Mare Ferrari, PA-C 08/26/20 1615    Terrilee Files, MD 08/27/20 1012

## 2020-08-26 NOTE — H&P (Signed)
Cardiology Admission History and Physical:   Patient ID: Glenn [name] MRN: [id number]; DOB: [birth year]   Admission date: 08/26/2020  Primary Care Provider: Ihor Gully, MD Primary Cardiologist: None  Primary Electrophysiologist:  None   Chief Complaint:  Leg swelling  Patient Profile:   Glenn [name] is a 20 y.o. male with Crohn's disease, asthma, DVT/PE and recently diagnosed HFrEF (biventricular dysfunction w LVEF 25%) who presents with bilateral lower extremity edema.  History of Present Illness:   Glenn [name] reports symptoms of severe bilateral lower extremity edema worsening over the past 2 weeks or so.  Since his diagnosis of heart failure in February, he has had some waxing and waning lower extremity edema, but never as severe as it is currently.  He states he has been taking all of his medications as prescribed, including Lasix, Entresto, spironolactone, ivabradine.  He does not notice any increase in his urine output when he takes his home dose of Lasix, which is 20 mg daily.  Notably he had only taken this for a 5-day course as prescribed.  Over the past 2 weeks however he has developed severe bilateral lower extremity edema extending nearly to the bilateral hips.  He has been adherent to his apixaban therapy.  Given the severity of his edema, the skin on his legs has began to crack, peel, and blister.  His feet and legs have become erythematous and there is some mild amount of weeping on the bilateral feet.  He has not been able to work or perform his ADLs or IADLs due to the symptoms.  He notably denies any chest pain, chest pressure, shortness of breath, orthopnea, PND, exertional dyspnea, palpitations, syncope, presyncope.  In the emergency department, the patient's vital signs were all within normal limits.  His laboratory studies were notable for sodium 129, potassium 2.7, hematocrit 26.4.  He was given p.o. and IV potassium and was admitted to cardiology for  further management.  Heart Pathway Score:     Past Medical History:  Diagnosis Date  . Asthma   . Crohn's disease (HCC) 05/23/2020   Diagnosed ~2019  . Deep vein thrombosis (DVT) of left lower extremity (HCC) 05/23/2020   L femoral, L popliteal veins 05/23/2020  . Headache(784.0)   . Pericardial effusion 05/23/2020  . Pulmonary emboli (HCC) 05/23/2020   Bilateral with e/o R heart strain on CTA  . Sickle cell trait (HCC) 20-Aug-1999    Past Surgical History:  Procedure Laterality Date  . CIRCUMCISION       Medications Prior to Admission: Prior to Admission medications   Medication Sig Start Date End Date Taking? Authorizing Provider  apixaban (ELIQUIS) 5 MG TABS tablet Take 1 tablet (5 mg total) by mouth 2 (two) times daily. 05/30/20   Regalado, Jon Billings A, MD  ferrous sulfate 325 (65 FE) MG tablet Take 1 tablet (325 mg total) by mouth daily with breakfast. 05/30/20   Regalado, Belkys A, MD  furosemide (LASIX) 20 MG tablet Take 1 tablet (20 mg total) by mouth daily for 5 days. 08/18/20 08/23/20  Joy, Hillard Danker, PA-C  ivabradine (CORLANOR) 7.5 MG TABS tablet Take 1 tablet (7.5 mg total) by mouth 2 (two) times daily with a meal. 06/08/20   Milford, Anderson Malta, FNP  Mesalamine (ASACOL) 400 MG CPDR DR capsule Take 1 capsule (400 mg total) by mouth daily. 05/30/20   Regalado, Belkys A, MD  ondansetron (ZOFRAN ODT) 4 MG disintegrating tablet Take 1 tablet (4 mg total) by  mouth every 8 (eight) hours as needed for nausea or vomiting. 07/26/20   Lamptey, Britta Mccreedy, MD  ondansetron (ZOFRAN) 4 MG tablet Take 1 tablet (4 mg total) by mouth every 6 (six) hours. Patient not taking: Reported on 08/18/2020 05/29/20   Regalado, Jon Billings A, MD  pantoprazole (PROTONIX) 40 MG tablet Take 1 tablet (40 mg total) by mouth daily. 05/30/20   Regalado, Belkys A, MD  sacubitril-valsartan (ENTRESTO) 24-26 MG Take 1 tablet by mouth 2 (two) times daily. 06/06/20   Jacklynn Ganong, FNP  spironolactone (ALDACTONE) 25 MG tablet Take 1  tablet (25 mg total) by mouth daily. 07/09/20   Laurey Morale, MD  SUMAtriptan (IMITREX) 25 MG tablet Take 1 tablet (25 mg total) by mouth once for 1 dose. May repeat in 2 hours if headache persists or recurs. 07/26/20 07/26/20  Merrilee Jansky, MD     Allergies:    Allergies  Allergen Reactions  . Penicillins Hives  . Amoxicillin Hives  . Cefdinir Hives    Social History:   Social History   Socioeconomic History  . Marital status: Single    Spouse name: Not on file  . Number of children: Not on file  . Years of education: Not on file  . Highest education level: Not on file  Occupational History  . Not on file  Tobacco Use  . Smoking status: Former Smoker    Quit date: 02/29/2020    Years since quitting: 0.4  . Smokeless tobacco: Never Used  Vaping Use  . Vaping Use: Former  Substance and Sexual Activity  . Alcohol use: No  . Drug use: Never  . Sexual activity: Not on file  Other Topics Concern  . Not on file  Social History Narrative  . Not on file   Social Determinants of Health   Financial Resource Strain: High Risk  . Difficulty of Paying Living Expenses: Hard  Food Insecurity: No Food Insecurity  . Worried About Programme researcher, broadcasting/film/video in the Last Year: Never true  . Ran Out of Food in the Last Year: Never true  Transportation Needs: No Transportation Needs  . Lack of Transportation (Medical): No  . Lack of Transportation (Non-Medical): No  Physical Activity: Inactive  . Days of Exercise per Week: 0 days  . Minutes of Exercise per Session: 0 min  Stress: Not on file  Social Connections: Not on file  Intimate Partner Violence: Not on file    Family History:   The patient's family history includes Seizures in his cousin.    ROS:  Please see the history of present illness.  All other ROS reviewed and negative.     Physical Exam/Data:   Vitals:   08/26/20 1611 08/26/20 1735 08/26/20 1925  BP: 100/71 111/72 126/68  Pulse: (!) 105 84 89  Resp: Temp: 98.6 F (37 C) 98.5 F (36.9 C)   TempSrc: Oral Oral   SpO2: 99% 99% 100%   No intake or output data in the 24 hours ending 08/26/20 1953 Last 3 Weights 08/18/2020 07/09/2020 06/06/2020  Weight (lbs) 180 lb 177 lb 6.4 oz 176 lb 12.8 oz  Weight (kg) 81.647 kg 80.468 kg 80.196 kg     There is no height or weight on file to calculate BMI.  General:  Well nourished, well developed, in no acute distress HEENT: normal Lymph: no adenopathy Neck: JVP elevated to ~10cm H2O Endocrine:  No thryomegaly Vascular: No carotid bruits; FA pulses  2+ bilaterally without bruits  Cardiac:  normal S1, S2; RRR; no murmur Lungs:  clear to auscultation bilaterally, no wheezing, rhonchi or rales  Abd: soft, nontender, no hepatomegaly  Ext: 3+ BLE edema extending past the bilateral knees Musculoskeletal:  No deformities, BUE and BLE strength normal and equal Skin: erythematous skin on bilateral feet with some serous weeping Neuro:  CNs 2-12 intact, no focal abnormalities noted Psych:  Normal affect   EKG:  Not yet done, currently pending  Relevant CV Studies: 05/28/20 Echo 1. Since the last study on 05/25/2020 pericardial effusion has improved,  now mild.  2. Left ventricular ejection fraction, by estimation, is 25 to 30%. The  left ventricle has severely decreased function. The left ventricle  demonstrates global hypokinesis.  3. Right ventricular systolic function is moderately reduced. The right  ventricular size is moderately enlarged. There is normal pulmonary artery  systolic pressure. The estimated right ventricular systolic pressure is  32.4 mmHg.  4. Left atrial size was mildly dilated.  5. A small pericardial effusion is present. The pericardial effusion is  circumferential. There is no evidence of cardiac tamponade.  6. Mild mitral valve regurgitation.  7. Tricuspid valve regurgitation is moderate.  8. Aortic valve regurgitation is trivial.   Laboratory Data:  High  Sensitivity Troponin:   Recent Labs  Lab 08/18/20 1200 08/18/20 1457 08/26/20 1615  TROPONINIHS 4 <2 3      Chemistry Recent Labs  Lab 08/26/20 1615  NA 129*  K 2.7*  CL 98  CO2 25  GLUCOSE 95  BUN 11  CREATININE 1.19  CALCIUM 6.2*  GFRNONAA >60  ANIONGAP 6    Recent Labs  Lab 08/26/20 1615  PROT 3.0*  ALBUMIN <1.0*  AST 21  ALT 39  ALKPHOS 92  BILITOT 0.2*   Hematology Recent Labs  Lab 08/26/20 1615  WBC 4.0  RBC 3.17*  HGB 8.7*  HCT 26.4*  MCV 83.3  MCH 27.4  MCHC 33.0  RDW 18.2*  PLT 399   BNP Recent Labs  Lab 08/26/20 1615  BNP 53.1    DDimer No results for input(s): DDIMER in the last 168 hours.   Radiology/Studies:  DG Chest Portable 1 View  Result Date: 08/26/2020 CLINICAL DATA:  Bilateral foot swelling, initial encounter EXAM: PORTABLE CHEST 1 VIEW COMPARISON:  08/18/2020 FINDINGS: The heart size and mediastinal contours are within normal limits. Both lungs are clear. The visualized skeletal structures are unremarkable. IMPRESSION: No active disease. Electronically Signed   By: Alcide Clever M.D.   On: 08/26/2020 17:13     Assessment and Plan:  Glenn [name] is a 20 y.o. male with Crohn's disease, asthma, DVT/PE and recently diagnosed HFrEF (biventricular dysfunction w LVEF 25%) who presents with bilateral lower extremity edema.  #) Edema: Appears to be most consistent with volume retention in the setting of acute heart failure exacerbation.  His history is consistent with under diuresis in the outpatient setting.  Interestingly, the patient has biventricular dysfunction on his February echocardiogram however his heart failure symptoms appear to be mostly right-sided and even more specifically limited to BLE edema without abdominal fluid retention.  In the setting of recently diagnosed PE, he is certainly at risk for right greater than left ventricular dysfunction. Also peculiar that his BNP is not elevated, despite taking entresto.  It  would be prudent to proceed with IV diuresis once his potassium is normalized and then to consider right heart catheterization to define his right and  left-sided filling pressures to help tailor treatment moving forward.  It is possible that his leg swelling may be attributable to post-thrombotic syndrome of the legs, however HF seems more likely at this time. As to the cause of his heart failure, this seems to temporally correlate with his diagnosis of COVID from approximately January 2022. - aggressive K replacement w PO and IV potassium ( PO, IV x 3) - recheck BMP after IV K is complete - will start IV diuretics (lasix 40mg  IV bolus to start) once K is near 4.0 - cont home entresto, spironolactone, ivabridine - if recurrent issues with hypokalemia, would increase spironolactone to 25mg  BID - will consult HF service in the AM - not on beta blocker, no documentation of intolerance in the chart; no signs of cardiogenic shock so would start this at some time during hospitalization - would start SGLT2 inhibitor once electrolyte abnormalities are corrected - compression stockings - BLE ultrasound to assess for obstructive thrombus - would proceed with RHC if leg swelling does not respond to diuretics as would be expected  #) PE: extensive clot burden seen on CT scan in February - cont apixaban   #) Nutrition: patient's albumin <1.0 very concerning with regard to nutritional status. Some of this may be from his chronic inflammatory disorder (Crohns), however he does state that he is not eating well due to his medical conditions.  - would consult nutrition - received IV iron in February, resending iron studies now  #) Hyponatremia: most likely related to volume overload from underlying HF - check TSH - urinalysis  #) Crohn's: - cont home mesalamine  Severity of Illness: The appropriate patient status for this patient is INPATIENT. Inpatient status is judged to be reasonable and  necessary in order to provide the required intensity of service to ensure the patient's safety. The patient's presenting symptoms, physical exam findings, and initial radiographic and laboratory data in the context of their chronic comorbidities is felt to place them at high risk for further clinical deterioration. Furthermore, it is not anticipated that the patient will be medically stable for discharge from the hospital within 2 midnights of admission. The following factors support the patient status of inpatient.   " The patient's presenting symptoms include leg swelling. " The worrisome physical exam findings include severe leg edema. " The initial radiographic and laboratory data are worrisome because of hyponatremia. " The chronic co-morbidities include HFrEF.   * I certify that at the point of admission it is my clinical judgment that the patient will require inpatient hospital care spanning beyond 2 midnights from the point of admission due to high intensity of service, high risk for further deterioration and high frequency of surveillance required.*    For questions or updates, please contact [facility] Please consult [email or web address] for contact info under        Signed, March, MD  08/26/2020 7:53 PM

## 2020-08-26 NOTE — ED Notes (Signed)
Report attempted rn to attempt again  

## 2020-08-27 ENCOUNTER — Other Ambulatory Visit: Payer: Self-pay

## 2020-08-27 ENCOUNTER — Encounter (HOSPITAL_COMMUNITY): Payer: BC Managed Care – PPO

## 2020-08-27 ENCOUNTER — Inpatient Hospital Stay (HOSPITAL_COMMUNITY): Payer: BC Managed Care – PPO

## 2020-08-27 DIAGNOSIS — I5023 Acute on chronic systolic (congestive) heart failure: Secondary | ICD-10-CM

## 2020-08-27 DIAGNOSIS — R609 Edema, unspecified: Secondary | ICD-10-CM

## 2020-08-27 DIAGNOSIS — E43 Unspecified severe protein-calorie malnutrition: Secondary | ICD-10-CM

## 2020-08-27 DIAGNOSIS — I50813 Acute on chronic right heart failure: Secondary | ICD-10-CM

## 2020-08-27 LAB — URINALYSIS, ROUTINE W REFLEX MICROSCOPIC
Bilirubin Urine: NEGATIVE
Glucose, UA: NEGATIVE mg/dL
Hgb urine dipstick: NEGATIVE
Ketones, ur: NEGATIVE mg/dL
Leukocytes,Ua: NEGATIVE
Nitrite: NEGATIVE
Protein, ur: NEGATIVE mg/dL
Specific Gravity, Urine: 1.011 (ref 1.005–1.030)
pH: 5 (ref 5.0–8.0)

## 2020-08-27 LAB — ECHOCARDIOGRAM COMPLETE
Area-P 1/2: 4.39 cm2
S' Lateral: 3.2 cm
Single Plane A4C EF: 58.4 %
Weight: 3114.66 oz

## 2020-08-27 LAB — BASIC METABOLIC PANEL
Anion gap: 3 — ABNORMAL LOW (ref 5–15)
Anion gap: 6 (ref 5–15)
BUN: 10 mg/dL (ref 6–20)
BUN: 10 mg/dL (ref 6–20)
CO2: 29 mmol/L (ref 22–32)
CO2: 24 mmol/L (ref 22–32)
Calcium: 6.2 mg/dL — CL (ref 8.9–10.3)
Calcium: 6.4 mg/dL — CL (ref 8.9–10.3)
Chloride: 97 mmol/L — ABNORMAL LOW (ref 98–111)
Chloride: 102 mmol/L (ref 98–111)
Creatinine, Ser: 1.1 mg/dL (ref 0.61–1.24)
Creatinine, Ser: 1.06 mg/dL (ref 0.61–1.24)
GFR, Estimated: >60 mL/min (ref >60)
GFR, Estimated: >60 mL/min (ref >60)
Glucose, Bld: 98 mg/dL (ref 70–99)
Glucose, Bld: 85 mg/dL (ref 70–99)
Potassium: 3.3 mmol/L — ABNORMAL LOW (ref 3.5–5.1)
Potassium: 3.7 mmol/L (ref 3.5–5.1)
Sodium: 129 mmol/L — ABNORMAL LOW (ref 135–145)
Sodium: 132 mmol/L — ABNORMAL LOW (ref 135–145)

## 2020-08-27 LAB — IRON AND TIBC: Iron: 43 ug/dL — ABNORMAL LOW (ref 45–182)

## 2020-08-27 LAB — TSH: TSH: 5.415 u[IU]/mL — ABNORMAL HIGH (ref 0.350–4.500)

## 2020-08-27 LAB — VITAMIN B12: Vitamin B-12: 1411 pg/mL — ABNORMAL HIGH (ref 180–914)

## 2020-08-27 LAB — FERRITIN: Ferritin: 102 ng/mL (ref 24–336)

## 2020-08-27 MED ORDER — POTASSIUM CHLORIDE CRYS ER 10 MEQ PO TBCR
40.0000 meq | EXTENDED_RELEASE_TABLET | Freq: Once | ORAL | Status: AC
  Administered 2020-08-27: 40 meq via ORAL
  Filled 2020-08-27: qty 4

## 2020-08-27 NOTE — Progress Notes (Signed)
BLE venous duplex has been completed.  Results can be found under chart review under CV PROC. 08/27/2020 7:03 PM Karyme Mcconathy RVT, RDMS

## 2020-08-27 NOTE — Progress Notes (Signed)
Paged for critical calcium level of 6.4. Albumin noted to <1, felt due to malnutrition per rounding MD. Calcium corrected for albumin level is 8.8, at lower end of normal reference range. D/w Dr. Anne Fu - hold off on any changes in regimen for now. Will place dietitian consult for nutritional assessment recommendations.

## 2020-08-27 NOTE — Progress Notes (Signed)
Progress Note  Patient Name: Glenn [name] Date of Encounter: 08/27/2020  [facility] Cardiologist: None   Subjective   Feeling well.  No shortness of breath or orthopnea. Only complaint is LE edema.   Inpatient Medications    Scheduled Meds: . apixaban  5 mg Oral BID  . ivabradine  7.5 mg Oral BID WC  . Mesalamine  400 mg Oral Daily  . pantoprazole  40 mg Oral Daily  . sacubitril-valsartan  1 tablet Oral BID  . sodium chloride flush  3 mL Intravenous Q12H  . spironolactone  25 mg Oral Daily   Continuous Infusions: . sodium chloride     PRN Meds: sodium chloride, acetaminophen, ondansetron (ZOFRAN) IV, sodium chloride flush   Vital Signs    Vitals:   08/26/20 2212 08/27/20 0635 08/27/20 0636 08/27/20 0707  BP: 121/62 (!) 95/52 (!) 95/52   Pulse: 87 (!) 115 98 98  Resp: 20 16 16    Temp: 98.4 F (36.9 C) 98.3 F (36.8 C) 98.3 F (36.8 C)   TempSrc: Oral Oral    SpO2: 100% 100%    Weight: 88.5 kg 88.3 kg      Intake/Output Summary (Last 24 hours) at 08/27/2020 0747 Last data filed at 08/27/2020 0600 Gross per 24 hour  Intake 480 ml  Output 800 ml  Net -320 ml   Last 3 Weights 08/27/2020 08/26/2020 08/18/2020  Weight (lbs) 194 lb 10.7 oz 195 lb 3.2 oz 180 lb  Weight (kg) 88.3 kg 88.542 kg 81.647 kg      Telemetry    Sinus rhythm, sinus tachycardia.  - Personally Reviewed  ECG    Sinus rhythm.  Rate 96 bpm. Nonspecific T wave abnormalities.  - Personally Reviewed  Physical Exam   VS:  BP (!) 95/52   Pulse 98   Temp 98.3 F (36.8 C)   Resp 16   Wt 88.3 kg Comment: Scale A  SpO2 100%   BMI 23.70 kg/m  , BMI Body mass index is 23.7 kg/m. GENERAL:  Well appearing HEENT: Pupils equal round and reactive, fundi not visualized, oral mucosa unremarkable NECK:  No jugular venous distention, waveform within normal limits, carotid upstroke brisk and symmetric, no bruits LUNGS:  Clear to auscultation bilaterally HEART:  RRR.  PMI not displaced or  sustained,S1 and S2 within normal limits, no S3, no S4, no clicks, no rubs, no murmurs ABD:  Flat, positive bowel sounds normal in frequency in pitch, no bruits, no rebound, no guarding, no midline pulsatile mass, no hepatomegaly, no splenomegaly EXT:  2 plus pulses throughout, 2+ LE edema to the upper tibia bilaterally, no cyanosis no clubbing SKIN:  No rashes no nodules NEURO:  Cranial nerves II through XII grossly intact, motor grossly intact throughout PSYCH:  Cognitively intact, oriented to person place and time   Labs    High Sensitivity Troponin:   Recent Labs  Lab 08/18/20 1200 08/18/20 1457 08/26/20 1615 08/26/20 2011  TROPONINIHS 4 2 3 3       Chemistry Recent Labs  Lab 08/26/20 1615 08/26/20 2323  NA 129* 129*  K 2.7* 3.3*  CL 98 97*  CO2 25 29  GLUCOSE 95 98  BUN 11 10  CREATININE 1.19 1.10  CALCIUM 6.2* 6.2*  PROT 3.0*  --   ALBUMIN <1.0*  --   AST 21  --   ALT 39  --   ALKPHOS 92  --   BILITOT 0.2*  --   GFRNONAA >60 >  60  ANIONGAP 6 3*     Hematology Recent Labs  Lab 08/26/20 1615 08/26/20 2010  WBC 4.0  --   RBC 3.17* 3.30*  HGB 8.7*  --   HCT 26.4*  --   MCV 83.3  --   MCH 27.4  --   MCHC 33.0  --   RDW 18.2*  --   PLT 399  --     BNP Recent Labs  Lab 08/26/20 1615  BNP 53.1     DDimer No results for input(s): DDIMER in the last 168 hours.   Radiology    DG Chest Portable 1 View  Result Date: 08/26/2020 CLINICAL DATA:  Bilateral foot swelling, initial encounter EXAM: PORTABLE CHEST 1 VIEW COMPARISON:  08/18/2020 FINDINGS: The heart size and mediastinal contours are within normal limits. Both lungs are clear. The visualized skeletal structures are unremarkable. IMPRESSION: No active disease. Electronically Signed   By: Alcide Clever M.D.   On: 08/26/2020 17:13    Cardiac Studies   Echo 05/28/20: 1. Since the last study on 05/25/2020 pericardial effusion has improved,  now mild.  2. Left ventricular ejection fraction, by  estimation, is 25 to 30%. The  left ventricle has severely decreased function. The left ventricle  demonstrates global hypokinesis.  3. Right ventricular systolic function is moderately reduced. The right  ventricular size is moderately enlarged. There is normal pulmonary artery  systolic pressure. The estimated right ventricular systolic pressure is  32.4 mmHg.  4. Left atrial size was mildly dilated.  5. A small pericardial effusion is present. The pericardial effusion is  circumferential. There is no evidence of cardiac tamponade.  6. Mild mitral valve regurgitation.  7. Tricuspid valve regurgitation is moderate.  8. Aortic valve regurgitation is trivial.   cMRI 05/28/20: IMPRESSION: 1. Normal LV size with EF 37%, diffuse hypokinesis worse towards the base and less towards the apex. ?Reverse Takotsubo pattern.  2. Moderate to severely dilated RV with severe systolic dysfunction, EF 20%. RV function preserved at the apex.  3. No myocardial LGE, so no definitive evidence for prior MI, infiltrative disease, or myocarditis.  4. Moderate pericardial effusion primarily at the apex, no tamponade.  Patient Profile     20 y.o. 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  Assessment & Plan    #Acute on chronic systolic diastolic heart failure: #Biventricular heart failure: Mr. Locy was admitted 05/2020 with acute systolic and also heart failure and pericardial effusion 05/2020.  Cardiac MRI showed no late gadolinium enhancement, no infiltrative disease for myocarditis.  There was diffuse hypokinesis with relative sparing of the apex.  There was a question of reverse Takotsubo pattern.  He has been followed by the advanced heart failure team and he was started on guideline directed medical therapy.  He has been feeling well physically  but now has lower extremity edema.  He has no orthopnea or PND.  BNP wnl.  He also has nutritional deficiency with an albumin <1 on admission.  He is laying flat comfortably in the bed.  His blood pressure is soft and his heart rate is elevated despite being on ivabradine.  I suspect that he is intravascularly volume depleted.  I think his lower extremity edema is more from his prior DVTs and nutritional deficiency.  We will place TED hose.  He already got a dose of IV Lasix today.  Monitor electrolytes  closely as he was already hyponatremic and hypokalemic on admission.  We will repeat his echocardiogram.  If this does not have better elucidate his heart failure status, he will get a right heart cath tomorrow.  He is amenable to having the procedure if necessary.  For now, continue ivabradine, Entresto, and spironolactone.  He has not been on a beta blocker presumably 2/2 low BP.  Recommend that he be seen by Advanced HF tomorrow to decide on RHC.  #Prior DVT/PE: Submassive PE and L LE DVT diagnosed 05/2020.  Extensive clot burden on CT.  Continue apixaban.  # Malnutrition:  Albumin <1.  Likely from Crohn's and poor oral intake.  Nutrition consult pending.  # Chron's: Continue mesalamine.      For questions or updates, please contact [facility] Please consult [email or web address] for contact info under        Signed, Chilton Si, MD  08/27/2020, 7:47 AM

## 2020-08-27 NOTE — Progress Notes (Signed)
Patient MEWS score changed from Green to Yellow due to an elevated heart rate 115 and soft BP 95/52. Notified covering MD via Amion. Awaiting a response. Patient has remained asymptomatic.   His heart rate returned to 98 after returning to bed from the bathroom.

## 2020-08-27 NOTE — Plan of Care (Signed)

## 2020-08-27 NOTE — Progress Notes (Signed)
NURSING PROGRESS NOTE  YANG RACK III [id number] Admission Data: 08/27/2020 2:27 AM Attending Provider: Annetta Maw* [id number], Shireen Quan, MD Code Status: Full  Glenn [name] III is a 20 y.o. male patient admitted from ED:  -No acute distress noted.  -No complaints of shortness of breath.  -No complaints of chest pain.   Cardiac Monitoring: Box # 04 in place. Cardiac monitor yields:Sinus Tach  Blood pressure 121/62, pulse 87, temperature 98.4 F (36.9 C), temperature source Oral, resp. rate 20, weight 88.5 kg, SpO2 100 %.   IV Fluids:  IV in place, occlusive dsg intact without redness, IV cath hand right, condition patent and no redness none.   Allergies:  Penicillins, Amoxicillin, and Cefdinir  Past Medical History:   has a past medical history of Asthma, Crohn's disease (HCC) (05/23/2020), Deep vein thrombosis (DVT) of left lower extremity (HCC) (05/23/2020), Headache(784.0), Pericardial effusion (05/23/2020), Pulmonary emboli (HCC) (05/23/2020), and Sickle cell trait (HCC) (January 19, 2000).  Past Surgical History:   has a past surgical history that includes Circumcision.  Social History:   reports that he quit smoking about 5 months ago. He has never used smokeless tobacco. He reports that he does not drink alcohol and does not use drugs.  Skin: Bilateral lower extremity edema with dry, crack, reddened feet.  Patient/Family orientated to room. Information packet given to patient/family. Admission inpatient armband information verified with patient/family to include name and date of birth and placed on patient arm. Side rails up x 2, fall assessment and education completed with patient/family. Patient/family able to verbalize understanding of risk associated with falls and verbalized understanding to call for assistance before getting out of bed. Call light within reach. Patient/family able to voice and demonstrate understanding of unit orientation instructions.     Will continue to evaluate and treat per MD orders.

## 2020-08-27 NOTE — Plan of Care (Signed)
  Problem: Cardiac: Goal: Ability to achieve and maintain adequate cardiopulmonary perfusion will improve Outcome: Progressing   

## 2020-08-27 NOTE — Progress Notes (Addendum)
Date and time results received: 08/27/20 1510   Test: Calcium 6.4   Name of Provider Notified: Cardiology awaiting response

## 2020-08-27 NOTE — Progress Notes (Signed)
  Echocardiogram 2D Echocardiogram has been performed.  Pieter Partridge 08/27/2020, 11:06 AM

## 2020-08-28 ENCOUNTER — Encounter (HOSPITAL_COMMUNITY): Payer: Self-pay | Admitting: Internal Medicine

## 2020-08-28 ENCOUNTER — Encounter (HOSPITAL_COMMUNITY): Payer: BC Managed Care – PPO

## 2020-08-28 ENCOUNTER — Encounter (HOSPITAL_COMMUNITY)
Admission: EM | Disposition: A | Discharge status: Home / Self Care | Payer: Self-pay | Source: Home / Self Care | Attending: Internal Medicine

## 2020-08-28 ENCOUNTER — Other Ambulatory Visit (HOSPITAL_COMMUNITY): Payer: Self-pay

## 2020-08-28 DIAGNOSIS — R600 Localized edema: Secondary | ICD-10-CM

## 2020-08-28 DIAGNOSIS — I5020 Unspecified systolic (congestive) heart failure: Secondary | ICD-10-CM

## 2020-08-28 LAB — CBC WITH DIFFERENTIAL/PLATELET
Abs Immature Granulocytes: 0.02 10*3/uL (ref 0.00–0.07)
Basophils Absolute: 0 10*3/uL (ref 0.0–0.1)
Basophils Relative: 0 %
Eosinophils Absolute: 0 10*3/uL (ref 0.0–0.5)
Eosinophils Relative: 1 %
HCT: 27.4 % — ABNORMAL LOW (ref 39.0–52.0)
Hemoglobin: 9.2 g/dL — ABNORMAL LOW (ref 13.0–17.0)
Immature Granulocytes: 0 %
Lymphocytes Relative: 29 %
Lymphs Abs: 1.5 10*3/uL (ref 0.7–4.0)
MCH: 27.4 pg (ref 26.0–34.0)
MCHC: 33.6 g/dL (ref 30.0–36.0)
MCV: 81.5 fL (ref 80.0–100.0)
Monocytes Absolute: 0.3 10*3/uL (ref 0.1–1.0)
Monocytes Relative: 7 %
Neutro Abs: 3.2 10*3/uL (ref 1.7–7.7)
Neutrophils Relative %: 63 %
Platelets: 309 10*3/uL (ref 150–400)
RBC: 3.36 MIL/uL — ABNORMAL LOW (ref 4.22–5.81)
RDW: 18.1 % — ABNORMAL HIGH (ref 11.5–15.5)
WBC: 5.1 10*3/uL (ref 4.0–10.5)
nRBC: 0.4 % — ABNORMAL HIGH (ref 0.0–0.2)

## 2020-08-28 LAB — BASIC METABOLIC PANEL
Anion gap: 4 — ABNORMAL LOW (ref 5–15)
BUN: 10 mg/dL (ref 6–20)
CO2: 25 mmol/L (ref 22–32)
Calcium: 6.5 mg/dL — ABNORMAL LOW (ref 8.9–10.3)
Chloride: 103 mmol/L (ref 98–111)
Creatinine, Ser: 1.01 mg/dL (ref 0.61–1.24)
GFR, Estimated: >60 mL/min (ref >60)
Glucose, Bld: 89 mg/dL (ref 70–99)
Potassium: 3.6 mmol/L (ref 3.5–5.1)
Sodium: 132 mmol/L — ABNORMAL LOW (ref 135–145)

## 2020-08-28 SURGERY — RIGHT HEART CATH
Anesthesia: LOCAL

## 2020-08-28 MED ORDER — SODIUM CHLORIDE 0.9% FLUSH
3.0000 mL | Freq: Two times a day (BID) | INTRAVENOUS | Status: DC
  Administered 2020-08-28 – 2020-08-29 (×3): 3 mL via INTRAVENOUS

## 2020-08-28 MED ORDER — SODIUM CHLORIDE 0.9 % IV SOLN: INTRAVENOUS | Status: DC

## 2020-08-28 MED ORDER — SODIUM CHLORIDE 0.9 % IV SOLN: 250.0000 mL | INTRAVENOUS | Status: DC | PRN

## 2020-08-28 MED ORDER — SODIUM CHLORIDE 0.9% FLUSH: 3.0000 mL | INTRAVENOUS | Status: DC | PRN

## 2020-08-28 MED ORDER — ADULT MULTIVITAMIN W/MINERALS CH
1.0000 | ORAL_TABLET | Freq: Every day | ORAL | Status: DC
  Administered 2020-08-28 – 2020-08-30 (×3): 1 via ORAL
  Filled 2020-08-28 (×3): qty 1

## 2020-08-28 NOTE — Plan of Care (Signed)

## 2020-08-28 NOTE — Progress Notes (Signed)
Initial Nutrition Assessment  DOCUMENTATION CODES:   Not applicable  INTERVENTION:   -Double protein portions with meals -MVI with minerals daily  NUTRITION DIAGNOSIS:   Increased nutrient needs related to chronic illness (crohn's disease) as evidenced by estimated needs.  GOAL:   Patient will meet greater than or equal to 90% of their needs  MONITOR:   PO intake,Supplement acceptance,Labs,Weight trends,Skin,I & O's  REASON FOR ASSESSMENT:   Consult Assessment of nutrition requirement/status  ASSESSMENT:   Glenn [name] is a 20 y.o. male with Crohn's disease, asthma, DVT/PE and recently diagnosed HFrEF (biventricular dysfunction w LVEF 25%) who presents with bilateral lower extremity edema.  Pt admitted with lower extremity edema.   Reviewed I/O's: +203 ml x 24 hours and -117 ml since admission  UOP: 1 L x 24 hours  Spoke with pt at bedside, who was pleasant and in good spirits today. He reports he has a fair appetite- consumed 100% of chicken wrap and some of the toppings off of his chef salad. Pt reports that he usually eats throughout the day- he explains that he is employed as a Biomedical scientist at the [employer] and has access to food all day long. Typical intake is as follows (Breakfas: fruit and granola bar OR cereal OR eggs and bacon; Lunch: chicken, vegetables, and potatoes; Snack: break; Dinner: pasta or chicken nuggets and mac and cheese). Documented meal completion 75-100%.   Pt shares that he noticed changes in his body composition several months ago secondary to a growth spurt. Pt shared pictures on his phone with this RD- he reports he used to be a "chunky kid" but became slimmer once he grew from 5'11" to 6'4". Pt also shares he became acutely ill a few months ago and lost weight, but has since gained most of his weight back; he has noticed that his face is more full lately.   Pt shares that he has been trying to consume more protein. His crohn's disease has  been stable and he has not difficulty obtaining his medications. His bowel movements have been stable (3-4 per day at baseline).   Discussed importance of good meal and supplement intake to promote healing.   Albumin has a half-life of 21 days and is strongly affected by stress response and inflammatory process, therefore, do not expect to see an improvement in this lab value during acute hospitalization. When a patient presents with low albumin, it is likely skewed due to the acute inflammatory response.  Unless it is suspected that patient had poor PO intake or malnutrition prior to admission, then RD should not be consulted solely for low albumin. Note that low albumin is no longer used to diagnose malnutrition; [name] uses the new malnutrition guidelines published by the American Society for Parenteral and Enteral Nutrition (A.S.P.E.N.) and the Academy of Nutrition and Dietetics (AND).    Medications reviewed and include aldactone.   Labs reviewed: Na: 132.   NUTRITION - FOCUSED PHYSICAL EXAM:  Flowsheet Row Most Recent Value  Orbital Region No depletion  Upper Arm Region No depletion  Thoracic and Lumbar Region No depletion  Buccal Region No depletion  Temple Region No depletion  Clavicle Bone Region No depletion  Clavicle and Acromion Bone Region No depletion  Scapular Bone Region No depletion  Dorsal Hand No depletion  Patellar Region No depletion  Anterior Thigh Region No depletion  Posterior Calf Region No depletion  Edema (RD Assessment) Severe  Hair Reviewed  Eyes Reviewed  Mouth  Reviewed  Skin Reviewed  Nails Reviewed       Diet Order:   Diet Order            Diet regular Room service appropriate? Yes; Fluid consistency: Thin  Diet effective now                 EDUCATION NEEDS:   Education needs have been addressed  Skin:  Skin Assessment: Reviewed RN Assessment  Last BM:  08/26/20  Height:   Ht Readings from Last 1 Encounters:  08/18/20 _0   (1.93 m)    Weight:   Wt Readings from Last 1 Encounters:  08/28/20 87.3 kg    Ideal Body Weight:  91.8 kg  BMI:  Body mass index is 23.43 kg/m.  Estimated Nutritional Needs:   Kcal:  2400-2600  Protein:  115-130 grams  Fluid:  > 2 L    Glenn [name], RD, LDN, [place] Registered Dietitian II Certified Diabetes Care and Education Specialist Please refer to [facility] for RD and/or RD on-call/weekend/after hours pager

## 2020-08-28 NOTE — Consult Note (Addendum)
Advanced Heart Failure Team Consult Note   Primary Physician: Ihor Gully, MD PCP-Cardiologist:  Dr. Jacques Navy AHF: Dr. Shirlee Latch   Reason for Consultation: Bilateral LEE in setting of h/o biventricular HF   HPI:    Glenn [name] is seen today for evaluation of bilateral LEE/ h/o biventricular heart failure, at the request of Dr. Jens Som, Cardiology.   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  Presented to ED 05/23/20 w/ complaints of SOB, cough and dizziness and found to have extensive acute bilateral PE w/ evidence of RV strain as well as moderate sized pericardial effusion. LE venous dopplers + for Lt sided DVT. 2D echo showed BiV dysfunction. LVEF 30-35% w/ global hypokensis. RV moderately enlarged w/ moderately reduced systolic function. Moderate TR. No prior studies for comparison. Cardiology initially consulted. Given hemodynamic stability, there was no immediate indication for TPA nor pericardiocentesis. He was placed on IV heparin.   AHF team was consulted given increasing volume overload and persistent tachycardia and hypotension. HR on admission was in the 140s but had improved down to the 110s. Had f/u limited echo and pericardial effusion seems slightly smaller. cMRI showed nomyocardial LGE, so no definitive evidence for prior MI, infiltrative disease, or myocarditis.Diffuse HK towards the base and less towards the apex.? Reverse Takotsubo pattern. Pericardial effusion on MRI felt to be moderate but no tamponade. He was discharged on GDMT and Eliquis. Discharge weight 179 lbs.  Had post hospital f/u at [facility] on 3/9. Volume status ok w/ NYHA Class II symptoms. Losartan transitioned to Leroy. Ivabradine increased to 7.5 bid. He was instructed to return back for further med titration w/ plans to repeat echo in 3 months but not seen since post hospital. Per pt  report, he was unable to get additional refills of Entresto and stopped after he finished the first 30 day supply. He was still taking spiro and ivabraine.   He presented to the [facility] on 5/29 w/ bilateral leg swelling, progressively worsening over the last 2 weeks. Denies dyspnea. Reports full med compliance. In ED, CMP showed severe hypoalbuminemia <1.0. UA negative for proteinuria. LFTs normal. Bilateral LE venous dopplers negative for DVT. BNP normal at 53.1. CXR w/o acute cardiopulmonary disease. EKG NSR 96 bpm. Echo repeated and improved. LVEF now 50-55%. RV mildly enlarged w/ normal systolic function. Estimated RVSP 27.8 mmHg. Mild TR and small pericardial effusion.   He has marked 3+ bilateral LEE on exam. Has rash on face w/ appearance of butterfly rash. He reports his mother died from an autoimmune disorder at age 66. Unsure if she had lupus but was told she ultimately died from autoimmune hepatitis.      Echo 08/27/20 1. Left ventricular ejection fraction, by estimation, is 50 to 55%. The left ventricle has low normal function. The left ventricle has no regional wall motion abnormalities. Left ventricular diastolic parameters were normal. 2. Right ventricular systolic function is normal. The right ventricular size is mildly enlarged. There is normal pulmonary artery systolic pressure. The estimated right ventricular systolic pressure is 27.8 mmHg. 3. A small pericardial effusion is present. The pericardial effusion is surrounding the apex. 4. The mitral valve is normal in structure. Trivial mitral valve regurgitation. No evidence of mitral stenosis. 5. The aortic valve is normal in structure. Aortic valve regurgitation is not visualized. No aortic stenosis is present. 6. The inferior vena cava is normal in size  with greater than 50% respiratory variability, suggesting right atrial pressure of 3 mmHg.   Review of Systems: [y] = yes, [ ]  = no   . General: Weight gain [ ] ; Weight loss  [ ] ; Anorexia [ ] ; Fatigue [ ] ; Fever [ ] ; Chills [ ] ; Weakness [ ]   . Cardiac: Chest pain/pressure [ ] ; Resting SOB [ ] ; Exertional SOB [ ] ; Orthopnea [ ] ; Pedal Edema [ Y]; Palpitations [ ] ; Syncope [ ] ; Presyncope [ ] ; Paroxysmal nocturnal dyspnea[ ]   . Pulmonary: Cough [ ] ; Wheezing[ ] ; Hemoptysis[ ] ; Sputum [ ] ; Snoring [ ]   . GI: Vomiting[ ] ; Dysphagia[ ] ; Melena[ ] ; Hematochezia [ ] ; Heartburn[ ] ; Abdominal pain [ Y]; Constipation [ ] ; Diarrhea [ ] ; BRBPR [ ]   . GU: Hematuria[ ] ; Dysuria [ ] ; Nocturia[ ]   . Vascular: Pain in legs with walking [ ] ; Pain in feet with lying flat [ ] ; Non-healing sores [ ] ; Stroke [ ] ; TIA [ ] ; Slurred speech [ ] ;  . Neuro: Headaches[ ] ; Vertigo[ ] ; Seizures[ ] ; Paresthesias[ ] ;Blurred vision [ ] ; Diplopia [ ] ; Vision changes [ ]   . Ortho/Skin: Arthritis [ ] ; Joint pain [ ] ; Muscle pain [ ] ; Joint swelling [ ] ; Back Pain [ ] ; Rash [ Y]  . Psych: Depression[ ] ; Anxiety[ ]   . Heme: Bleeding problems [ ] ; Clotting disorders [ ] ; Anemia [ ]   . Endocrine: Diabetes [ ] ; Thyroid dysfunction[ ]   Home Medications Prior to Admission medications   Medication Sig Start Date End Date Taking? Authorizing Provider  apixaban (ELIQUIS) 5 MG TABS tablet Take 1 tablet (5 mg total) by mouth 2 (two) times daily. 05/30/20  Yes Regalado, Belkys A, MD  ferrous sulfate 325 (65 FE) MG tablet Take 1 tablet (325 mg total) by mouth daily with breakfast. 05/30/20  Yes Regalado, Belkys A, MD  ivabradine (CORLANOR) 7.5 MG TABS tablet Take 1 tablet (7.5 mg total) by mouth 2 (two) times daily with a meal. 06/08/20  Yes Milford, Medulla, FNP  Mesalamine (ASACOL) 400 MG CPDR DR capsule Take 1 capsule (400 mg total) by mouth daily. 05/30/20  Yes Regalado, Belkys A, MD  ondansetron (ZOFRAN ODT) 4 MG disintegrating tablet Take 1 tablet (4 mg total) by mouth every 8 (eight) hours as needed for nausea or vomiting. 07/26/20  Yes Lamptey, Britta Mccreedy, MD  spironolactone (ALDACTONE) 25 MG tablet Take 1 tablet  (25 mg total) by mouth daily. 07/09/20  Yes Laurey Morale, MD  SUMAtriptan (IMITREX) 25 MG tablet Take 1 tablet (25 mg total) by mouth once for 1 dose. May repeat in 2 hours if headache persists or recurs. 07/26/20 07/26/20 Yes Lamptey, Britta Mccreedy, MD  furosemide (LASIX) 20 MG tablet Take 1 tablet (20 mg total) by mouth daily for 5 days. Patient not taking: Reported on 08/26/2020 08/18/20 08/23/20  Joy, Ines Bloomer C, PA-C  ondansetron (ZOFRAN) 4 MG tablet Take 1 tablet (4 mg total) by mouth every 6 (six) hours. Patient not taking: No sig reported 05/29/20   Regalado, Belkys A, MD  pantoprazole (PROTONIX) 40 MG tablet Take 1 tablet (40 mg total) by mouth daily. Patient not taking: Reported on 08/26/2020 05/30/20   Regalado, Jon Billings A, MD  sacubitril-valsartan (ENTRESTO) 24-26 MG Take 1 tablet by mouth 2 (two) times daily. Patient not taking: Reported on 08/26/2020 06/06/20   Jacklynn Ganong, FNP    Past Medical History: Past Medical History:  Diagnosis Date  . Asthma   . Crohn's disease (  HCC) 05/23/2020   Diagnosed ~2019  . Deep vein thrombosis (DVT) of left lower extremity (HCC) 05/23/2020   L femoral, L popliteal veins 05/23/2020  . Headache(784.0)   . Pericardial effusion 05/23/2020  . Pulmonary emboli (HCC) 05/23/2020   Bilateral with e/o R heart strain on CTA  . Sickle cell trait (HCC) 01/03/00    Past Surgical History: Past Surgical History:  Procedure Laterality Date  . CIRCUMCISION      Family History: Family History  Problem Relation Age of Onset  . Autoimmune disease Mother   . Hepatitis Mother   . Seizures Cousin        Epilepsy    Social History: Social History   Socioeconomic History  . Marital status: Single    Spouse name: Not on file  . Number of children: Not on file  . Years of education: Not on file  . Highest education level: Not on file  Occupational History  . Not on file  Tobacco Use  . Smoking status: Former Smoker    Quit date: 02/29/2020    Years since  quitting: 0.4  . Smokeless tobacco: Never Used  Vaping Use  . Vaping Use: Former  Substance and Sexual Activity  . Alcohol use: No  . Drug use: Never  . Sexual activity: Not on file  Other Topics Concern  . Not on file  Social History Narrative  . Not on file   Social Determinants of Health   Financial Resource Strain: High Risk  . Difficulty of Paying Living Expenses: Hard  Food Insecurity: No Food Insecurity  . Worried About Programme researcher, broadcasting/film/video in the Last Year: Never true  . Ran Out of Food in the Last Year: Never true  Transportation Needs: No Transportation Needs  . Lack of Transportation (Medical): No  . Lack of Transportation (Non-Medical): No  Physical Activity: Inactive  . Days of Exercise per Week: 0 days  . Minutes of Exercise per Session: 0 min  Stress: Not on file  Social Connections: Not on file    Allergies:  Allergies  Allergen Reactions  . Penicillins Hives  . Amoxicillin Hives  . Cefdinir Hives    Objective:    Vital Signs:   Temp:  [97.9 F (36.6 C)-99 F (37.2 C)] 98.4 F (36.9 C) (05/31 0936) Pulse Rate:  [91-105] 101 (05/31 0936) Resp:  [16-18] 18 (05/31 0936) BP: (95-111)/(41-57) 111/48 (05/31 0936) SpO2:  [99 %-100 %] 99 % (05/31 0936) Weight:  [87.3 kg] 87.3 kg (05/31 0507) Last BM Date: 08/26/20  Weight change: Filed Weights   08/26/20 2212 08/27/20 0635 08/28/20 0507  Weight: 88.5 kg 88.3 kg 87.3 kg    Intake/Output:   Intake/Output Summary (Last 24 hours) at 08/28/2020 1039 Last data filed at 08/28/2020 1024 Gross per 24 hour  Intake 965 ml  Output 1000 ml  Net -35 ml      Physical Exam    General:  Young male. No resp difficulty ? Malar rash on face  HEENT: normal Neck: supple. JVP 7-8 cm . Carotids 2+ bilat; no bruits. No lymphadenopathy or thyromegaly appreciated. Cor: PMI nondisplaced. Regular rate & rhythm. No rubs, gallops or murmurs. Lungs: clear Abdomen: soft, nontender, nondistended. No hepatosplenomegaly.  No bruits or masses. Good bowel sounds. Extremities: no cyanosis, clubbing, rash, 3+ bilateral pitting edema, erythema dorsal aspect of bilateral feet  Neuro: alert & orientedx3, cranial nerves grossly intact. moves all 4 extremities w/o difficulty. Affect pleasant   Telemetry  SVT 150s on 5/30, currently in NSR HR 80s  EKG    Admit EKG NSR, 96 bpm   Labs   Basic Metabolic Panel: Recent Labs  Lab 08/26/20 1615 08/26/20 2010 08/26/20 2323 08/27/20 0207 08/28/20 0344  NA 129*  --  129* 132* 132*  K 2.7*  --  3.3* 3.7 3.6  CL 98  --  97* 102 103  CO2 25  --  29 24 25   GLUCOSE 95  --  98 85 89  BUN 11  --  10 10 10   CREATININE 1.19  --  1.10 1.06 1.01  CALCIUM 6.2*  --  6.2* 6.4* 6.5*  MG  --  1.7  --   --   --     Liver Function Tests: Recent Labs  Lab 08/26/20 1615  AST 21  ALT 39  ALKPHOS 92  BILITOT 0.2*  PROT 3.0*  ALBUMIN <1.0*   No results for input(s): LIPASE, AMYLASE in the last 168 hours. No results for input(s): AMMONIA in the last 168 hours.  CBC: Recent Labs  Lab 08/26/20 1615 08/28/20 0344  WBC 4.0 5.1  NEUTROABS 3.7 3.2  HGB 8.7* 9.2*  HCT 26.4* 27.4*  MCV 83.3 81.5  PLT 399 309    Cardiac Enzymes: No results for input(s): CKTOTAL, CKMB, CKMBINDEX, TROPONINI in the last 168 hours.  BNP: BNP (last 3 results) Recent Labs    05/23/20 1307 08/18/20 1200 08/26/20 1615  BNP 355.4* 44.5 53.1    ProBNP (last 3 results) No results for input(s): PROBNP in the last 8760 hours.   CBG: No results for input(s): GLUCAP in the last 168 hours.  Coagulation Studies: No results for input(s): LABPROT, INR in the last 72 hours.   Imaging   ECHOCARDIOGRAM COMPLETE  Result Date: 08/27/2020    ECHOCARDIOGRAM REPORT   Patient Name:   JABORI HENEGAR [name] Date of Exam: 08/27/2020 Medical Rec #:  Cecile Sheerer          Height:       76.0 in Accession #:    08/29/2020         Weight:       194.7 lb Date of Birth:  06-13-2000          BSA:           2.190 m Patient Age:    20 years           BP:           95/52 mmHg Patient Gender: M                  HR:           98 bpm. Exam Location:  Inpatient Procedure: 2D Echo, Cardiac Doppler and Color Doppler Indications:    CHF  History:        Patient has prior history of Echocardiogram examinations, most                 recent 05/28/2020. CHF, Pericardial Disease, DVT; Risk Factors:LE                 edema. H/o COVID, pulmonary embolus.  Sonographer:    05/26/2000 Referring Phys: [phone] TIFFANY [place] IMPRESSIONS  1. Left ventricular ejection fraction, by estimation, is 50 to 55%. The left ventricle has low normal function. The left ventricle has no regional wall motion abnormalities. Left ventricular diastolic parameters were normal.  2. Right ventricular systolic function is normal. The  right ventricular size is mildly enlarged. There is normal pulmonary artery systolic pressure. The estimated right ventricular systolic pressure is 27.8 mmHg.  3. A small pericardial effusion is present. The pericardial effusion is surrounding the apex.  4. The mitral valve is normal in structure. Trivial mitral valve regurgitation. No evidence of mitral stenosis.  5. The aortic valve is normal in structure. Aortic valve regurgitation is not visualized. No aortic stenosis is present.  6. The inferior vena cava is normal in size with greater than 50% respiratory variability, suggesting right atrial pressure of 3 mmHg. Comparison(s): Prior images reviewed side by side. The left ventricular function has improved. FINDINGS  Left Ventricle: Left ventricular ejection fraction, by estimation, is 50 to 55%. The left ventricle has low normal function. The left ventricle has no regional wall motion abnormalities. The left ventricular internal cavity size was normal in size. There is no left ventricular hypertrophy. Left ventricular diastolic parameters were normal. Right Ventricle: The right ventricular size is mildly enlarged. No  increase in right ventricular wall thickness. Right ventricular systolic function is normal. There is normal pulmonary artery systolic pressure. The tricuspid regurgitant velocity is 2.49  m/s, and with an assumed right atrial pressure of 3 mmHg, the estimated right ventricular systolic pressure is 27.8 mmHg. Left Atrium: Left atrial size was normal in size. Right Atrium: Right atrial size was normal in size. Pericardium: A small pericardial effusion is present. The pericardial effusion is surrounding the apex. Mitral Valve: The mitral valve is normal in structure. Trivial mitral valve regurgitation. No evidence of mitral valve stenosis. Tricuspid Valve: The tricuspid valve is normal in structure. Tricuspid valve regurgitation is mild . No evidence of tricuspid stenosis. Aortic Valve: The aortic valve is normal in structure. Aortic valve regurgitation is not visualized. No aortic stenosis is present. Pulmonic Valve: The pulmonic valve was normal in structure. Pulmonic valve regurgitation is not visualized. No evidence of pulmonic stenosis. Aorta: The aortic root is normal in size and structure. Venous: The inferior vena cava is normal in size with greater than 50% respiratory variability, suggesting right atrial pressure of 3 mmHg. IAS/Shunts: No atrial level shunt detected by color flow Doppler.  LEFT VENTRICLE PLAX 2D LVIDd:         4.40 cm      Diastology LVIDs:         3.20 cm      LV e' medial:    11.00 cm/s LV PW:         1.00 cm      LV E/e' medial:  7.5 LV IVS:        0.90 cm      LV e' lateral:   10.80 cm/s                             LV E/e' lateral: 7.7  LV Volumes (MOD) LV vol d, MOD A4C: 134.0 ml LV vol s, MOD A4C: 55.8 ml LV SV MOD A4C:     134.0 ml RIGHT VENTRICLE RV Basal diam:  4.00 cm RV S prime:     13.30 cm/s TAPSE (M-mode): 2.0 cm LEFT ATRIUM             Index       RIGHT ATRIUM           Index LA diam:        3.30 cm 1.51 cm/m  RA Area:     15.80  cm LA Vol (A2C):   40.9 ml 18.68 ml/m RA  Volume:   45.30 ml  20.69 ml/m LA Vol (A4C):   51.3 ml 23.43 ml/m LA Biplane Vol: 48.5 ml 22.15 ml/m  AORTIC VALVE LVOT Vmax:   92.40 cm/s LVOT Vmean:  56.700 cm/s LVOT VTI:    0.176 m  AORTA Ao Root diam: 2.70 cm MITRAL VALVE               TRICUSPID VALVE MV Area (PHT): 4.39 cm    TR Peak grad:   24.8 mmHg MV Decel Time: 173 msec    TR Vmax:        249.00 cm/s MV E velocity: 82.70 cm/s MV A velocity: 70.30 cm/s  SHUNTS MV E/A ratio:  1.18        Systemic VTI: 0.18 m Donato Schultz MD Electronically signed by Donato Schultz MD Signature Date/Time: 08/27/2020/11:32:25 AM    Final    VAS [place] LOWER EXTREMITY VENOUS (DVT)  Result Date: 08/27/2020  Lower Venous DVT Study Patient Name:  ALAZAR CHERIAN [name]  Date of Exam:   08/27/2020 Medical Rec #: [id number]           Accession #:    [id number] Date of Birth: 2000/12/06           Patient Gender: M Patient Age:   20Y Exam Location:  [facility] Procedure:      VAS [place] LOWER EXTREMITY VENOUS (DVT) Referring Phys: [id number] ANTHONY PHILIP CARNICELLI --------------------------------------------------------------------------------  Indications: Edema.  Risk Factors: PE HX 05/2020 DVT HX LLE 05/2020. Limitations: Poor ultrasound/tissue interface and subcutaneous edema. Comparison Study: Previous exam 05/23/20 DVT LLE FV DIST & POPV Performing Technologist: Ernestene Mention  Examination Guidelines: A complete evaluation includes B-mode imaging, spectral Doppler, color Doppler, and power Doppler as needed of all accessible portions of each vessel. Bilateral testing is considered an integral part of a complete examination. Limited examinations for reoccurring indications may be performed as noted. The reflux portion of the exam is performed with the patient in reverse Trendelenburg.  +---------+---------------+---------+-----------+----------+-------------------+ RIGHT    CompressibilityPhasicitySpontaneityPropertiesThrombus Aging       +---------+---------------+---------+-----------+----------+-------------------+ CFV      Full           Yes      Yes                                      +---------+---------------+---------+-----------+----------+-------------------+ SFJ      Full                                                             +---------+---------------+---------+-----------+----------+-------------------+ FV Prox  Full           Yes      Yes                                      +---------+---------------+---------+-----------+----------+-------------------+ FV Mid   Full           Yes      Yes                                      +---------+---------------+---------+-----------+----------+-------------------+  FV DistalFull           Yes      Yes                                      +---------+---------------+---------+-----------+----------+-------------------+ PFV      Full                                                             +---------+---------------+---------+-----------+----------+-------------------+ POP      Full           Yes      Yes                                      +---------+---------------+---------+-----------+----------+-------------------+ PTV      Full                                         Not well visualized +---------+---------------+---------+-----------+----------+-------------------+ PERO     Full                                         Not well visualized +---------+---------------+---------+-----------+----------+-------------------+   +---------+---------------+---------+-----------+----------+-------------------+ LEFT     CompressibilityPhasicitySpontaneityPropertiesThrombus Aging      +---------+---------------+---------+-----------+----------+-------------------+ CFV      Full           Yes      Yes                                      +---------+---------------+---------+-----------+----------+-------------------+  SFJ      Full                                                             +---------+---------------+---------+-----------+----------+-------------------+ FV Prox  Full           Yes      Yes                                      +---------+---------------+---------+-----------+----------+-------------------+ FV Mid   Full           Yes      Yes                                      +---------+---------------+---------+-----------+----------+-------------------+ FV DistalFull           Yes      Yes                  Not well visualized +---------+---------------+---------+-----------+----------+-------------------+ PFV  Full                                                             +---------+---------------+---------+-----------+----------+-------------------+ POP      Full           Yes      Yes                                      +---------+---------------+---------+-----------+----------+-------------------+ PTV      Full                                         Not well visualized +---------+---------------+---------+-----------+----------+-------------------+ PERO     Full                                         Not well visualized +---------+---------------+---------+-----------+----------+-------------------+    Summary: BILATERAL: - No evidence of deep vein thrombosis seen in the lower extremities, bilaterally. - No evidence of superficial venous thrombosis in the lower extremities, bilaterally. -No evidence of popliteal cyst, bilaterally.   *See table(s) above for measurements and observations.    Preliminary      Medications:     Current Medications: . apixaban  5 mg Oral BID  . ivabradine  7.5 mg Oral BID WC  . Mesalamine  400 mg Oral Daily  . pantoprazole  40 mg Oral Daily  . sacubitril-valsartan  1 tablet Oral BID  . sodium chloride flush  3 mL Intravenous Q12H  . sodium chloride flush  3 mL Intravenous Q12H  . spironolactone  25  mg Oral Daily    Infusions: . sodium chloride    . sodium chloride    . sodium chloride       Assessment/Plan   1. Bilateral LEE  - h/o DVT 2/22. Remains on Eliquis w/ full compliance. F/u venous dopplers this admit negative for DVT - BNP normal at 53. Echo w/ improved BiV function. LVEF normalized 50-55%. RV systolic function normal. RVSP 27   - Edema 2/2 severe hypoalbuminemia <1.0. 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 Would likely benefit from Boost/Ensure tid wc  - don't think RHC would be useful   2. H/o Biventricular Heart Failure - Echo 2/22 at time of PE diagnosis showed severe RV dysfunction with McConnell's sign and moderate RV enlargement. LV systolic function also severely reduced at 30%. cMRI showed no myocardial LGE. 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  - Repeat Echo this admit shows normalized LVEF, now 50-55%. 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 BNP normal at 53 and CXR negative. No symptoms of pulmonary congestion.  - Continue Spiro 25 + Entresto 24-26 mg bid + ivabradine 7.5 bid   3. H/o Unprovoked DVT/PE  - continue Eliquis - repeat venous dopplers negative for DVT -  consider w/u for lupus (see below)   4. Crohn's - on Mesalamine - followed by outpatient GI     Consider w/u for autoimmune disorder. Mother had an autoimmune disorder. Specifics unknown but she apparently died from autoimmune hepatitis at age 31. Given family history, severe hypoalbuminemia, appearance of malar rash and h/o unprovoked DVT/PE, consider lupus w/u.    Length of Stay: 2  Robbie Lis, PA-C  08/28/2020, 10:39 AM  Advanced Heart Failure Team Pager [phone] (M-F; 7a - 5p)   Please contact [facility] for night-coverage after hours (4p -7a ) and weekends on [email or web address]  Patient seen with PA, agree with the above note.   Patient was admitted with marked bilateral lower leg edema.   He has history of PE and DVT as well as Crohns disease.  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     Repeat echo this admission showed LV EF improved to 50-55% with normal RV systolic function and IVC not dilated.  BNP not elevated.  No proteinuria.  Serum albumin markedly low at < 1.   General: NAD Neck: JVP 7-8 cm, no thyromegaly or thyroid nodule.  Lungs: Clear to auscultation bilaterally with normal respiratory effort. CV: Nondisplaced PMI.  Heart regular S1/S2, no S3/S4, no murmur.  2+ edema 2/3 to knees bilaterally.  No carotid bruit.  Normal pedal pulses.  Abdomen: Soft, nontender, no hepatosplenomegaly, no distention.  Skin: Erythema dorsum of feet  Neurologic: Alert and oriented x 3.  Psych: Normal affect. Extremities: No clubbing or cyanosis.  HEENT: Normal.   I agree that the peripheral edema does not seem to be CHF-related.  His LV EF has improved to 50-55% (improved, consistent with recovery of a Takotsubo-type cardiomyopathy).  Normal BNP. JVP not significantly elevated and IVC small on echo. I suspect that his peripheral edema is related to his low oncotic pressure with albumin < 1.  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   - Would continue cardiac regimen for now, Entresto/ivabradine/spironolactone.  - Wrap legs with unna boots.  - Will ask GI to see given Crohns and concern for a protein-losing enteropathy.  Any further GI workup or Crohns treatment needed at this point?  - Think we can hold off on RHC for now.   Marca Ancona 08/28/2020 4:19 PM

## 2020-08-28 NOTE — Progress Notes (Signed)
Progress Note  Patient Name: Glenn [name] Date of Encounter: 08/28/2020  [facility] Cardiologist: Dr Shirlee Latch  Subjective   No CP or dyspnea; complains of lower extremity pain  Inpatient Medications    Scheduled Meds: . apixaban  5 mg Oral BID  . ivabradine  7.5 mg Oral BID WC  . Mesalamine  400 mg Oral Daily  . pantoprazole  40 mg Oral Daily  . sacubitril-valsartan  1 tablet Oral BID  . sodium chloride flush  3 mL Intravenous Q12H  . sodium chloride flush  3 mL Intravenous Q12H  . spironolactone  25 mg Oral Daily   Continuous Infusions: . sodium chloride    . sodium chloride    . sodium chloride     PRN Meds: sodium chloride, sodium chloride, acetaminophen, ondansetron (ZOFRAN) IV, sodium chloride flush, sodium chloride flush   Vital Signs    Vitals:   08/27/20 1524 08/27/20 1640 08/27/20 1951 08/28/20 0507  BP: (!) 95/41 (!) 104/50 (!) 109/57 (!) 108/54  Pulse: 91  (!) 105 96  Resp: Temp: 97.9 F (36.6 C)  99 F (37.2 C) 98.7 F (37.1 C)  TempSrc: Oral  Oral Oral  SpO2: 100%  100% 100%  Weight:    87.3 kg    Intake/Output Summary (Last 24 hours) at 08/28/2020 0842 Last data filed at 08/28/2020 0806 Gross per 24 hour  Intake 1203 ml  Output 1000 ml  Net 203 ml   Last 3 Weights 08/28/2020 08/27/2020 08/26/2020  Weight (lbs) 192 lb 8 oz 194 lb 10.7 oz 195 lb 3.2 oz  Weight (kg) 87.317 kg 88.3 kg 88.542 kg      Telemetry    Sinus to sinus tachycardia - Personally Reviewed  Physical Exam   GEN: No acute distress.   Neck: supple Cardiac: RRR Respiratory: Clear to auscultation bilaterally. GI: Soft, nontender, non-distended  MS: 3 + edema; erythema over dorsum of feet bilaterally Neuro:  Nonfocal  Psych: Normal affect   Labs    High Sensitivity Troponin:   Recent Labs  Lab 08/18/20 1200 08/18/20 1457 08/26/20 1615 08/26/20 2011  TROPONINIHS 4 2 3 3       Chemistry Recent Labs  Lab 08/26/20 1615 08/26/20 2323  08/27/20 0207 08/28/20 0344  NA 129* 129* 132* 132*  K 2.7* 3.3* 3.7 3.6  CL 98 97* 102 103  CO2 GLUCOSE 95 98 85 89  BUN CREATININE 1.19 1.10 1.06 1.01  CALCIUM 6.2* 6.2* 6.4* 6.5*  PROT 3.0*  --   --   --   ALBUMIN <1.0*  --   --   --   AST 21  --   --   --   ALT 39  --   --   --   ALKPHOS 92  --   --   --   BILITOT 0.2*  --   --   --   GFRNONAA >60 >60 >60 >60  ANIONGAP 6 3* 6 4*     Hematology Recent Labs  Lab 08/26/20 1615 08/26/20 2010 08/28/20 0344  WBC 4.0  --  5.1  RBC 3.17* 3.30* 3.36*  HGB 8.7*  --  9.2*  HCT 26.4*  --  27.4*  MCV 83.3  --  81.5  MCH 27.4  --  27.4  MCHC 33.0  --  33.6  RDW 18.2*  --  18.1*  PLT 399  --  309    BNP Recent Labs  Lab 08/26/20 1615  BNP 53.1    Radiology    DG Chest Portable 1 View  Result Date: 08/26/2020 CLINICAL DATA:  Bilateral foot swelling, initial encounter EXAM: PORTABLE CHEST 1 VIEW COMPARISON:  08/18/2020 FINDINGS: The heart size and mediastinal contours are within normal limits. Both lungs are clear. The visualized skeletal structures are unremarkable. IMPRESSION: No active disease. Electronically Signed   By: Alcide Clever M.D.   On: 08/26/2020 17:13   ECHOCARDIOGRAM COMPLETE  Result Date: 08/27/2020    ECHOCARDIOGRAM REPORT   Patient Name:   Glenn [name] Date of Exam: 08/27/2020 Medical Rec #:  [id number]          Height:       76.0 in Accession #:    [id number]         Weight:       194.7 lb Date of Birth:  11-27-2000          BSA:          2.190 m Patient Age:    20 years           BP:           95/52 mmHg Patient Gender: M                  HR:           98 bpm. Exam Location:  Inpatient Procedure: 2D Echo, Cardiac Doppler and Color Doppler Indications:    CHF  History:        Patient has prior history of Echocardiogram examinations, most                 recent 05/28/2020. CHF, Pericardial Disease, DVT; Risk Factors:LE                 edema. H/o COVID, pulmonary embolus.   Sonographer:    Lavenia Atlas Referring Phys: [id number] TIFFANY [place] IMPRESSIONS  1. Left ventricular ejection fraction, by estimation, is 50 to 55%. The left ventricle has low normal function. The left ventricle has no regional wall motion abnormalities. Left ventricular diastolic parameters were normal.  2. Right ventricular systolic function is normal. The right ventricular size is mildly enlarged. There is normal pulmonary artery systolic pressure. The estimated right ventricular systolic pressure is 27.8 mmHg.  3. A small pericardial effusion is present. The pericardial effusion is surrounding the apex.  4. The mitral valve is normal in structure. Trivial mitral valve regurgitation. No evidence of mitral stenosis.  5. The aortic valve is normal in structure. Aortic valve regurgitation is not visualized. No aortic stenosis is present.  6. The inferior vena cava is normal in size with greater than 50% respiratory variability, suggesting right atrial pressure of 3 mmHg. Comparison(s): Prior images reviewed side by side. The left ventricular function has improved. FINDINGS  Left Ventricle: Left ventricular ejection fraction, by estimation, is 50 to 55%. The left ventricle has low normal function. The left ventricle has no regional wall motion abnormalities. The left ventricular internal cavity size was normal in size. There is no left ventricular hypertrophy. Left ventricular diastolic parameters were normal. Right Ventricle: The right ventricular size is mildly enlarged. No increase in right ventricular wall thickness. Right ventricular systolic function is normal. There is normal pulmonary artery systolic pressure. The tricuspid regurgitant velocity is 2.49  m/s, and with an assumed right atrial pressure of 3 mmHg, the estimated right ventricular systolic pressure is  27.8 mmHg. Left Atrium: Left atrial size was normal in size. Right Atrium: Right atrial size was normal in size. Pericardium: A small  pericardial effusion is present. The pericardial effusion is surrounding the apex. Mitral Valve: The mitral valve is normal in structure. Trivial mitral valve regurgitation. No evidence of mitral valve stenosis. Tricuspid Valve: The tricuspid valve is normal in structure. Tricuspid valve regurgitation is mild . No evidence of tricuspid stenosis. Aortic Valve: The aortic valve is normal in structure. Aortic valve regurgitation is not visualized. No aortic stenosis is present. Pulmonic Valve: The pulmonic valve was normal in structure. Pulmonic valve regurgitation is not visualized. No evidence of pulmonic stenosis. Aorta: The aortic root is normal in size and structure. Venous: The inferior vena cava is normal in size with greater than 50% respiratory variability, suggesting right atrial pressure of 3 mmHg. IAS/Shunts: No atrial level shunt detected by color flow Doppler.  LEFT VENTRICLE PLAX 2D LVIDd:         4.40 cm      Diastology LVIDs:         3.20 cm      LV e' medial:    11.00 cm/s LV PW:         1.00 cm      LV E/e' medial:  7.5 LV IVS:        0.90 cm      LV e' lateral:   10.80 cm/s                             LV E/e' lateral: 7.7  LV Volumes (MOD) LV vol d, MOD A4C: 134.0 ml LV vol s, MOD A4C: 55.8 ml LV SV MOD A4C:     134.0 ml RIGHT VENTRICLE RV Basal diam:  4.00 cm RV S prime:     13.30 cm/s TAPSE (M-mode): 2.0 cm LEFT ATRIUM             Index       RIGHT ATRIUM           Index LA diam:        3.30 cm 1.51 cm/m  RA Area:     15.80 cm LA Vol (A2C):   40.9 ml 18.68 ml/m RA Volume:   45.30 ml  20.69 ml/m LA Vol (A4C):   51.3 ml 23.43 ml/m LA Biplane Vol: 48.5 ml 22.15 ml/m  AORTIC VALVE LVOT Vmax:   92.40 cm/s LVOT Vmean:  56.700 cm/s LVOT VTI:    0.176 m  AORTA Ao Root diam: 2.70 cm MITRAL VALVE               TRICUSPID VALVE MV Area (PHT): 4.39 cm    TR Peak grad:   24.8 mmHg MV Decel Time: 173 msec    TR Vmax:        249.00 cm/s MV E velocity: 82.70 cm/s MV A velocity: 70.30 cm/s  SHUNTS MV E/A  ratio:  1.18        Systemic VTI: 0.18 m Donato Schultz MD Electronically signed by Donato Schultz MD Signature Date/Time: 08/27/2020/11:32:25 AM    Final    VAS [place] LOWER EXTREMITY VENOUS (DVT)  Result Date: 08/27/2020  Lower Venous DVT Study Patient Name:  Glenn [name]  Date of Exam:   08/27/2020 Medical Rec #: [id number]           Accession #:    [id number] Date of Birth: 23-Dec-2000  Patient Gender: M Patient Age:   2Y Exam Location:  [facility] Procedure:      VAS [place] LOWER EXTREMITY VENOUS (DVT) Referring Phys: [id number] ANTHONY PHILIP CARNICELLI --------------------------------------------------------------------------------  Indications: Edema.  Risk Factors: PE HX 05/2020 DVT HX LLE 05/2020. Limitations: Poor ultrasound/tissue interface and subcutaneous edema. Comparison Study: Previous exam 05/23/20 DVT LLE FV DIST & POPV Performing Technologist: Ernestene Mention  Examination Guidelines: A complete evaluation includes B-mode imaging, spectral Doppler, color Doppler, and power Doppler as needed of all accessible portions of each vessel. Bilateral testing is considered an integral part of a complete examination. Limited examinations for reoccurring indications may be performed as noted. The reflux portion of the exam is performed with the patient in reverse Trendelenburg.  +---------+---------------+---------+-----------+----------+-------------------+ RIGHT    CompressibilityPhasicitySpontaneityPropertiesThrombus Aging      +---------+---------------+---------+-----------+----------+-------------------+ CFV      Full           Yes      Yes                                      +---------+---------------+---------+-----------+----------+-------------------+ SFJ      Full                                                             +---------+---------------+---------+-----------+----------+-------------------+ FV Prox  Full           Yes      Yes                                       +---------+---------------+---------+-----------+----------+-------------------+ FV Mid   Full           Yes      Yes                                      +---------+---------------+---------+-----------+----------+-------------------+ FV DistalFull           Yes      Yes                                      +---------+---------------+---------+-----------+----------+-------------------+ PFV      Full                                                             +---------+---------------+---------+-----------+----------+-------------------+ POP      Full           Yes      Yes                                      +---------+---------------+---------+-----------+----------+-------------------+ PTV      Full  Not well visualized +---------+---------------+---------+-----------+----------+-------------------+ PERO     Full                                         Not well visualized +---------+---------------+---------+-----------+----------+-------------------+   +---------+---------------+---------+-----------+----------+-------------------+ LEFT     CompressibilityPhasicitySpontaneityPropertiesThrombus Aging      +---------+---------------+---------+-----------+----------+-------------------+ CFV      Full           Yes      Yes                                      +---------+---------------+---------+-----------+----------+-------------------+ SFJ      Full                                                             +---------+---------------+---------+-----------+----------+-------------------+ FV Prox  Full           Yes      Yes                                      +---------+---------------+---------+-----------+----------+-------------------+ FV Mid   Full           Yes      Yes                                       +---------+---------------+---------+-----------+----------+-------------------+ FV DistalFull           Yes      Yes                  Not well visualized +---------+---------------+---------+-----------+----------+-------------------+ PFV      Full                                                             +---------+---------------+---------+-----------+----------+-------------------+ POP      Full           Yes      Yes                                      +---------+---------------+---------+-----------+----------+-------------------+ PTV      Full                                         Not well visualized +---------+---------------+---------+-----------+----------+-------------------+ PERO     Full                                         Not well visualized +---------+---------------+---------+-----------+----------+-------------------+    Summary: BILATERAL: - No evidence of  deep vein thrombosis seen in the lower extremities, bilaterally. - No evidence of superficial venous thrombosis in the lower extremities, bilaterally. -No evidence of popliteal cyst, bilaterally.   *See table(s) above for measurements and observations.    Preliminary     Patient Profile     20 y.o. 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 Takotsubo cardiomyopathy, Crohn's disease, DVT/PE, and asthma admitted with severe lower extremity edema.  Echocardiogram this admission shows ejection fraction 50 to 55%, mild right ventricular enlargement, small pericardial effusion.  Assessment & Plan    1 lower extremity edema-etiology unclear.  Patient denies dyspnea.  BNP 53.  I have personally reviewed his echocardiogram which shows improvement in LV and RV function.  Lower extremity venous Dopplers showed no DVT.  Patient's albumin is than 1.  Urinalysis does not show proteinuria so do not feel he has nephrotic syndrome.  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  Will review with CHF service whether right heart cath necessary.  2 history of DVT/pulmonary embolus-continue apixaban.  3 history of Crohn's disease-continue mesalamine.  For questions or updates, please contact [facility] Please consult [email or web address] for contact info under        Signed, Olga Millers, MD  08/28/2020, 8:42 AM

## 2020-08-28 NOTE — Plan of Care (Signed)
?  Problem: Activity: ?Goal: Capacity to carry out activities will improve ?Outcome: Progressing ?  ?

## 2020-08-29 LAB — ENA+DNA/DS+ANTICH+CENTRO+JO...
Anti JO-1: <0.2 AI (ref 0.0–0.9)
Centromere Ab Screen: <0.2 AI (ref 0.0–0.9)
Chromatin Ab SerPl-aCnc: <0.2 AI (ref 0.0–0.9)
ENA SM Ab Ser-aCnc: <0.2 AI (ref 0.0–0.9)
Ribonucleic Protein: <0.2 AI (ref 0.0–0.9)
SSA (Ro) (ENA) Antibody, IgG: 4.1 AI — ABNORMAL HIGH (ref 0.0–0.9)
SSB (La) (ENA) Antibody, IgG: <0.2 AI (ref 0.0–0.9)
Scleroderma (Scl-70) (ENA) Antibody, IgG: <0.2 AI (ref 0.0–0.9)
ds DNA Ab: <1 IU/mL (ref 0–9)

## 2020-08-29 LAB — BASIC METABOLIC PANEL
Anion gap: 3 — ABNORMAL LOW (ref 5–15)
BUN: 10 mg/dL (ref 6–20)
CO2: 29 mmol/L (ref 22–32)
Calcium: 6.7 mg/dL — ABNORMAL LOW (ref 8.9–10.3)
Chloride: 100 mmol/L (ref 98–111)
Creatinine, Ser: 1.04 mg/dL (ref 0.61–1.24)
GFR, Estimated: >60 mL/min (ref >60)
Glucose, Bld: 78 mg/dL (ref 70–99)
Potassium: 4.2 mmol/L (ref 3.5–5.1)
Sodium: 132 mmol/L — ABNORMAL LOW (ref 135–145)

## 2020-08-29 LAB — ANA W/REFLEX IF POSITIVE: Anti Nuclear Antibody (ANA): POSITIVE — AB

## 2020-08-29 NOTE — Consult Note (Signed)
[facility] Consultation Note  Referring Provider: Cardiology Primary Care Physician:  Ihor Gully, MD Primary Gastroenterologist:  Dr. Ewing Schlein  Reason for Consultation:  Edema, Crohn's disease  HPI: Glenn [name] is a 20 y.o. male admitted for heart failure symptoms.  History recent systolic heart failure and DVT/PE about 2-3 months ago.  Crohn's, ileocolonic, diagnosed 2019.  He has intermittently been taking mesalamine agents.  Currently, he has no abdominal pain, diarrhea, blood in stool, or any of the other symptoms he had previously associated with his Crohn's disease.   Past Medical History:  Diagnosis Date  . Asthma   . Crohn's disease (HCC) 05/23/2020   Diagnosed ~2019  . Deep vein thrombosis (DVT) of left lower extremity (HCC) 05/23/2020   L femoral, L popliteal veins 05/23/2020  . Headache(784.0)   . Pericardial effusion 05/23/2020  . Pulmonary emboli (HCC) 05/23/2020   Bilateral with e/o R heart strain on CTA  . Sickle cell trait (HCC) January 24, 2000    Past Surgical History:  Procedure Laterality Date  . CIRCUMCISION      Prior to Admission medications   Medication Sig Start Date End Date Taking? Authorizing Provider  apixaban (ELIQUIS) 5 MG TABS tablet Take 1 tablet (5 mg total) by mouth 2 (two) times daily. 05/30/20  Yes Regalado, Belkys A, MD  ferrous sulfate 325 (65 FE) MG tablet Take 1 tablet (325 mg total) by mouth daily with breakfast. 05/30/20  Yes Regalado, Belkys A, MD  ivabradine (CORLANOR) 7.5 MG TABS tablet Take 1 tablet (7.5 mg total) by mouth 2 (two) times daily with a meal. 06/08/20  Yes Milford, Breckenridge, FNP  Mesalamine (ASACOL) 400 MG CPDR DR capsule Take 1 capsule (400 mg total) by mouth daily. 05/30/20  Yes Regalado, Belkys A, MD  ondansetron (ZOFRAN ODT) 4 MG disintegrating tablet Take 1 tablet (4 mg total) by mouth every 8 (eight) hours as needed for nausea or vomiting. 07/26/20  Yes Lamptey, Britta Mccreedy, MD  spironolactone (ALDACTONE) 25  MG tablet Take 1 tablet (25 mg total) by mouth daily. 07/09/20  Yes Laurey Morale, MD  SUMAtriptan (IMITREX) 25 MG tablet Take 1 tablet (25 mg total) by mouth once for 1 dose. May repeat in 2 hours if headache persists or recurs. 07/26/20 07/26/20 Yes Lamptey, Britta Mccreedy, MD  furosemide (LASIX) 20 MG tablet Take 1 tablet (20 mg total) by mouth daily for 5 days. Patient not taking: Reported on 08/26/2020 08/18/20 08/23/20  Joy, Ines Bloomer C, PA-C  ondansetron (ZOFRAN) 4 MG tablet Take 1 tablet (4 mg total) by mouth every 6 (six) hours. Patient not taking: No sig reported 05/29/20   Regalado, Belkys A, MD  pantoprazole (PROTONIX) 40 MG tablet Take 1 tablet (40 mg total) by mouth daily. Patient not taking: Reported on 08/26/2020 05/30/20   Regalado, Jon Billings A, MD  sacubitril-valsartan (ENTRESTO) 24-26 MG Take 1 tablet by mouth 2 (two) times daily. Patient not taking: Reported on 08/26/2020 06/06/20   Jacklynn Ganong, FNP    Current Facility-Administered Medications  Medication Dose Route Frequency Provider Last Rate Last Admin  . 0.9 %  sodium chloride infusion  250 mL Intravenous PRN Rosario Jacks, MD      . 0.9 %  sodium chloride infusion  250 mL Intravenous PRN Chilton Si, MD      . 0.9 %  sodium chloride infusion   Intravenous Continuous Corky Crafts, MD      . acetaminophen (TYLENOL) tablet 650 mg  650 mg Oral Q4H PRN Rosario Jacks, MD      . apixaban Everlene Balls) tablet 5 mg  5 mg Oral BID Rosario Jacks, MD   5 mg at 08/29/20 0817  . ivabradine (CORLANOR) tablet 7.5 mg  7.5 mg Oral BID WC Carnicelli, Felipe Drone, MD   7.5 mg at 08/29/20 0732  . Mesalamine (ASACOL) DR capsule 400 mg  400 mg Oral Daily Rosario Jacks, MD   400 mg at 08/29/20 9147  . multivitamin with minerals tablet 1 tablet  1 tablet Oral Daily Rosario Jacks, MD   1 tablet at 08/29/20 0817  . ondansetron (ZOFRAN) injection 4 mg  4 mg Intravenous Q6H PRN  Rosario Jacks, MD   4 mg at 08/28/20 0532  . pantoprazole (PROTONIX) EC tablet 40 mg  40 mg Oral Daily Rosario Jacks, MD   40 mg at 08/29/20 0817  . sacubitril-valsartan (ENTRESTO) 24-26 mg per tablet  1 tablet Oral BID Rosario Jacks, MD   1 tablet at 08/29/20 0817  . sodium chloride flush (NS) 0.9 % injection 3 mL  3 mL Intravenous Q12H Carnicelli, Felipe Drone, MD   3 mL at 08/29/20 0820  . sodium chloride flush (NS) 0.9 % injection 3 mL  3 mL Intravenous PRN Carnicelli, Felipe Drone, MD      . sodium chloride flush (NS) 0.9 % injection 3 mL  3 mL Intravenous Q12H Chilton Si, MD   3 mL at 08/29/20 0820  . sodium chloride flush (NS) 0.9 % injection 3 mL  3 mL Intravenous PRN Chilton Si, MD      . spironolactone (ALDACTONE) tablet 25 mg  25 mg Oral Daily Rosario Jacks, MD   25 mg at 08/29/20 0817    Allergies as of 08/26/2020 - Review Complete 08/26/2020  Allergen Reaction Noted  . Penicillins Hives 10/19/2012  . Amoxicillin Hives 06/08/2020  . Cefdinir Hives 02/27/2011    Family History  Problem Relation Age of Onset  . Autoimmune disease Mother   . Hepatitis Mother   . Seizures Cousin        Epilepsy    Social History   Socioeconomic History  . Marital status: Single    Spouse name: Not on file  . Number of children: Not on file  . Years of education: Not on file  . Highest education level: Not on file  Occupational History  . Not on file  Tobacco Use  . Smoking status: Former Smoker    Quit date: 02/29/2020    Years since quitting: 0.4  . Smokeless tobacco: Never Used  Vaping Use  . Vaping Use: Former  Substance and Sexual Activity  . Alcohol use: No  . Drug use: Never  . Sexual activity: Not on file  Other Topics Concern  . Not on file  Social History Narrative  . Not on file   Social Determinants of Health   Financial Resource Strain: High Risk  . Difficulty of Paying Living Expenses: Hard   Food Insecurity: No Food Insecurity  . Worried About Programme researcher, broadcasting/film/video in the Last Year: Never true  . Ran Out of Food in the Last Year: Never true  Transportation Needs: No Transportation Needs  . Lack of Transportation (Medical): No  . Lack of Transportation (Non-Medical): No  Physical Activity: Inactive  . Days of Exercise per Week: 0 days  . Minutes of Exercise per Session: 0 min  Stress: Not on file  Social Connections: Not on file  Intimate Partner Violence: Not on file    Review of Systems: As per HPI all others negative  Physical Exam: Vital signs in last 24 hours: Temp:  [98.1 F (36.7 C)-98.6 F (37 C)] 98.3 F (36.8 C) (06/01 1148) Pulse Rate:  [76-98] 88 (06/01 1148) Resp:  [16-18] 16 (06/01 1148) BP: (95-114)/(47-69) 99/47 (06/01 1148) SpO2:  [97 %-100 %] 97 % (06/01 1148) Weight:  [87.8 kg] 87.8 kg (06/01 0640) Last BM Date: 08/28/20 General:   Alert,  Well-developed, well-nourished, pleasant and cooperative in NAD Head:  Normocephalic and atraumatic. Eyes:  Sclera clear, no icterus.   Conjunctiva pink. Ears:  Normal auditory acuity. Nose:  No deformity, discharge,  or lesions. Mouth:  No deformity or lesions.  Oropharynx pink & moist. Neck:  Supple; no masses or thyromegaly.  Abdomen:  Soft, nontender and nondistended. No masses, hepatosplenomegaly or hernias noted. Normal bowel sounds, without guarding, and without rebound.     Msk:  Symmetrical without gross deformities. Normal posture. Pulses:  Normal pulses noted. Extremities:  Edematous with wrappings Neurologic:  Alert and  oriented x4;  grossly normal neurologically. Skin:  Intact without significant lesions or rashes. Psych:  Alert and cooperative. Normal mood and affect.   Lab Results: Recent Labs    08/26/20 1615 08/28/20 0344  WBC 4.0 5.1  HGB 8.7* 9.2*  HCT 26.4* 27.4*  PLT 399 309   BMET Recent Labs    08/27/20 0207 08/28/20 0344 08/29/20 0305  NA 132* 132* 132*  K 3.7 3.6  4.2  CL 102 103 100  CO2 24 25 29   GLUCOSE 85 89 78  BUN 10 10 10   CREATININE 1.06 1.01 1.04  CALCIUM 6.4* 6.5* 6.7*   LFT Recent Labs    08/26/20 1615  PROT 3.0*  ALBUMIN <1.0*  AST 21  ALT 39  ALKPHOS 92  BILITOT 0.2*   PT/INR No results for input(s): LABPROT, INR in the last 72 hours.  Studies/Results: VAS [place] LOWER EXTREMITY VENOUS (DVT)  Result Date: 08/27/2020  Lower Venous DVT Study Patient Name:  BASEM MAISONET [name]  Date of Exam:   08/27/2020 Medical Rec #: [id number]           Accession #:    [id number] Date of Birth: 07-Mar-2001           Patient Gender: M Patient Age:   20Y Exam Location:  [facility] Procedure:      VAS [place] LOWER EXTREMITY VENOUS (DVT) Referring Phys: [id number] ANTHONY PHILIP CARNICELLI --------------------------------------------------------------------------------  Indications: Edema.  Risk Factors: PE HX 05/2020 DVT HX LLE 05/2020. Limitations: Poor ultrasound/tissue interface and subcutaneous edema. Comparison Study: Previous exam 05/23/20 DVT LLE FV DIST & POPV Performing Technologist: Ernestene Mention  Examination Guidelines: A complete evaluation includes B-mode imaging, spectral Doppler, color Doppler, and power Doppler as needed of all accessible portions of each vessel. Bilateral testing is considered an integral part of a complete examination. Limited examinations for reoccurring indications may be performed as noted. The reflux portion of the exam is performed with the patient in reverse Trendelenburg.  +---------+---------------+---------+-----------+----------+-------------------+ RIGHT    CompressibilityPhasicitySpontaneityPropertiesThrombus Aging      +---------+---------------+---------+-----------+----------+-------------------+ CFV      Full           Yes      Yes                                      +---------+---------------+---------+-----------+----------+-------------------+  SFJ      Full                                                              +---------+---------------+---------+-----------+----------+-------------------+ FV Prox  Full           Yes      Yes                                      +---------+---------------+---------+-----------+----------+-------------------+ FV Mid   Full           Yes      Yes                                      +---------+---------------+---------+-----------+----------+-------------------+ FV DistalFull           Yes      Yes                                      +---------+---------------+---------+-----------+----------+-------------------+ PFV      Full                                                             +---------+---------------+---------+-----------+----------+-------------------+ POP      Full           Yes      Yes                                      +---------+---------------+---------+-----------+----------+-------------------+ PTV      Full                                         Not well visualized +---------+---------------+---------+-----------+----------+-------------------+ PERO     Full                                         Not well visualized +---------+---------------+---------+-----------+----------+-------------------+   +---------+---------------+---------+-----------+----------+-------------------+ LEFT     CompressibilityPhasicitySpontaneityPropertiesThrombus Aging      +---------+---------------+---------+-----------+----------+-------------------+ CFV      Full           Yes      Yes                                      +---------+---------------+---------+-----------+----------+-------------------+ SFJ      Full                                                             +---------+---------------+---------+-----------+----------+-------------------+  FV Prox  Full           Yes      Yes                                       +---------+---------------+---------+-----------+----------+-------------------+ FV Mid   Full           Yes      Yes                                      +---------+---------------+---------+-----------+----------+-------------------+ FV DistalFull           Yes      Yes                  Not well visualized +---------+---------------+---------+-----------+----------+-------------------+ PFV      Full                                                             +---------+---------------+---------+-----------+----------+-------------------+ POP      Full           Yes      Yes                                      +---------+---------------+---------+-----------+----------+-------------------+ PTV      Full                                         Not well visualized +---------+---------------+---------+-----------+----------+-------------------+ PERO     Full                                         Not well visualized +---------+---------------+---------+-----------+----------+-------------------+    Summary: BILATERAL: - No evidence of deep vein thrombosis seen in the lower extremities, bilaterally. - No evidence of superficial venous thrombosis in the lower extremities, bilaterally. -No evidence of popliteal cyst, bilaterally.   *See table(s) above for measurements and observations.    Preliminary     Impression:  1.  Ileocolonic Crohn's disease, diagnosed 2019. 2.  Recent PE/DVT/heart failure. 3.  Admission for heart failure symptoms including LE edema.  Plan:  1.  While certainly Crohn's disease, and other autoimmune diseases, can increase risk for blood clots, I do not think patient's Crohn's disease is immediate cause for patient's current edema and other symptoms.  Patient's Crohn's disease at this point is clinically quiescent. 2.  Would manage heart failure symptoms and Dr. Ewing Schlein can follow-up with patient as outpatient. 3.  No anticipated role for  inpatient GI management at this time. 4.  [facility] will sign-off; please call with questions; thank you for the consultation.     LOS: 3 days   Jomaira Darr M  08/29/2020, 2:49 PM  Cell [phone] If no answer or after 5 PM call [phone]

## 2020-08-29 NOTE — Progress Notes (Addendum)
Advanced Heart Failure Rounding Note  PCP-Cardiologist: None   Subjective:    Legs feel much better w/ unna boots. No dyspnea.   Objective:   Weight Range: 87.8 kg Body mass index is 23.55 kg/m.   Vital Signs:   Temp:  [98.1 F (36.7 C)-98.6 F (37 C)] 98.3 F (36.8 C) (06/01 0822) Pulse Rate:  [76-99] 98 (06/01 0822) Resp:  [17-18] 18 (06/01 0822) BP: (95-114)/(47-69) 101/47 (06/01 0822) SpO2:  [99 %-100 %] 99 % (06/01 0822) Weight:  [87.8 kg] 87.8 kg (06/01 0640) Last BM Date: 08/28/20  Weight change: Filed Weights   08/27/20 0635 08/28/20 0507 08/29/20 0640  Weight: 88.3 kg 87.3 kg 87.8 kg    Intake/Output:   Intake/Output Summary (Last 24 hours) at 08/29/2020 0936 Last data filed at 08/29/2020 0820 Gross per 24 hour  Intake 1124 ml  Output 200 ml  Net 924 ml      Physical Exam    General:  Sickly appearing young male. No resp difficulty HEENT: Normal + facial rash ? Malar rash  Neck: Supple. JVP . Carotids 2+ bilat; no bruits. No lymphadenopathy or thyromegaly appreciated. Cor: PMI nondisplaced. Regular rate & rhythm. No rubs, gallops or murmurs. Lungs: Clear Abdomen: Soft, nontender, nondistended. No hepatosplenomegaly. No bruits or masses. Good bowel sounds. Extremities: No cyanosis, clubbing, rash, 1+ bilateral LE edema, bilateral unna boots  Neuro: Alert & orientedx3, cranial nerves grossly intact. moves all 4 extremities w/o difficulty. Affect pleasant   Telemetry   NSR 90s   EKG    No new EKG to review   Labs    CBC Recent Labs    08/26/20 1615 08/28/20 0344  WBC 4.0 5.1  NEUTROABS 3.7 3.2  HGB 8.7* 9.2*  HCT 26.4* 27.4*  MCV 83.3 81.5  PLT 399 309   Basic Metabolic Panel Recent Labs    84/16/60 2010 08/26/20 2323 08/28/20 0344 08/29/20 0305  NA  --    < > 132* 132*  K  --    < > 3.6 4.2  CL  --    < > 103 100  CO2  --    < > 25 29  GLUCOSE  --    < > 89 78  BUN  --    < > 10 10  CREATININE  --    < > 1.01 1.04   CALCIUM  --    < > 6.5* 6.7*  MG 1.7  --   --   --    < > = values in this interval not displayed.   Liver Function Tests Recent Labs    08/26/20 1615  AST 21  ALT 39  ALKPHOS 92  BILITOT 0.2*  PROT 3.0*  ALBUMIN <1.0*   No results for input(s): LIPASE, AMYLASE in the last 72 hours. Cardiac Enzymes No results for input(s): CKTOTAL, CKMB, CKMBINDEX, TROPONINI in the last 72 hours.  BNP: BNP (last 3 results) Recent Labs    05/23/20 1307 08/18/20 1200 08/26/20 1615  BNP 355.4* 44.5 53.1    ProBNP (last 3 results) No results for input(s): PROBNP in the last 8760 hours.   D-Dimer No results for input(s): DDIMER in the last 72 hours. Hemoglobin A1C No results for input(s): HGBA1C in the last 72 hours. Fasting Lipid Panel No results for input(s): CHOL, HDL, LDLCALC, TRIG, CHOLHDL, LDLDIRECT in the last 72 hours. Thyroid Function Tests Recent Labs    08/26/20 2323  TSH 5.415*    Other  results:   Imaging     No results found.   Medications:     Scheduled Medications: . apixaban  5 mg Oral BID  . ivabradine  7.5 mg Oral BID WC  . Mesalamine  400 mg Oral Daily  . multivitamin with minerals  1 tablet Oral Daily  . pantoprazole  40 mg Oral Daily  . sacubitril-valsartan  1 tablet Oral BID  . sodium chloride flush  3 mL Intravenous Q12H  . sodium chloride flush  3 mL Intravenous Q12H  . spironolactone  25 mg Oral Daily     Infusions: . sodium chloride    . sodium chloride    . sodium chloride       PRN Medications:  sodium chloride, sodium chloride, acetaminophen, ondansetron (ZOFRAN) IV, sodium chloride flush, sodium chloride flush     Assessment/Plan   1. Bilateral LEE  - h/o DVT 2/22. Remains on Eliquis w/ full compliance. F/u venous dopplers this admit negative for DVT - BNP normal at 53. Echo w/ improved BiV function. LVEF normalized 50-55%. RV systolic function normal. RVSP 27   - Edema 2/2 severe hypoalbuminemia <1.0. UA negative  for proteinuria. 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 H/o Biventricular Heart Failure - Echo 2/22 at time of PE diagnosis showed severe RV dysfunction with McConnell's sign and moderate RV enlargement. LV systolic function also severely reduced at 30%. cMRI showed nomyocardial LGE. 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  - Repeat Echo this admit shows normalized LVEF, now 50-55%. 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 BNP normal at 53 and CXR negative. No symptoms of pulmonary congestion.  - Continue Spiro 25 + Entresto 24-26 mg bid + ivabradine 7.5 bid   3. H/o Unprovoked DVT/PE  - continue Eliquis - repeat venous dopplers negative for DVT - consider w/u for lupus (see below)   4. Crohn's - on Mesalamine - followed by Dr. Claretha Cooper - GI consulted for protein losing enteropathy. They will see today    Consider w/u for autoimmune disorder. Mother had an autoimmune disorder. Specifics unknown but she apparently died from autoimmune hepatitis at age 67. Given family history, severe hypoalbuminemia, appearance of malar rash and h/o unprovoked DVT/PE, consider lupus w/u. ANA panel pending   Length of Stay: 3  Knute Neu  08/29/2020, 9:36 AM  Advanced Heart Failure Team Pager [phone] (M-F; 7a - 5p)  Please contact [facility] for night-coverage after hours (5p -7a ) and weekends on [email or web address]  Patient seen with PA, agree with the above note.   Patient was admitted with marked bilateral lower leg edema.   He has history of PE and DVT as well as Crohns disease.  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     Repeat echo this admission showed LV EF improved to 50-55% with normal RV systolic function and IVC not dilated.  BNP not elevated.  No proteinuria.  Serum albumin markedly low at < 1.  No DVT on lower extremity venous dopplers.   General: NAD Neck: No JVD, no thyromegaly or thyroid nodule.  Lungs:  Clear to auscultation bilaterally with normal respiratory effort. CV: Nondisplaced PMI.  Heart regular S1/S2, no S3/S4, no murmur.  1+ edema to knees. Abdomen: Soft, nontender, no hepatosplenomegaly, no distention.  Skin: Intact without lesions or rashes.  Neurologic: Alert and oriented x 3.  Psych: Normal affect. Extremities: No clubbing or cyanosis.  HEENT: Normal.   I agree that the peripheral edema does not seem to be CHF-related.  His LV EF has improved to 50-55% (consistent with recovery of a Takotsubo-type cardiomyopathy).  Normal BNP. JVP not significantly elevated and IVC small on echo. I suspect that his peripheral edema is related to his low oncotic pressure with albumin < 1.  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   - Would continue cardiac regimen for now, Entresto/ivabradine/spironolactone.  - Unna boots on  - Have asked GI to see given Crohns and concern for a protein-losing enteropathy.  No note, but per patient the GI MD told him that the peripheral edema was not related to his Crohns.  However, the peripheral edema appears to be due to markedly low albumin, and as he says he eats normally, the albumin is either being lost via the gut or the urine, and it is not in the urine. Would be curious to hear from GI directly.  - Think we can hold off on RHC.   Should be able to go home soon, requests discharge in am after Unna boots changed out.   Marca Ancona 08/29/2020 11:53 AM

## 2020-08-29 NOTE — TOC Initial Note (Signed)
Transition of Care (TOC) - Initial/Assessment Note  Heart Failure   Patient Details  Name: Glenn [name] MRN: [id number] Date of Birth: 2001/01/18  Transition of Care [facility]) CM/SW Contact:    Glenn [name], LCSWA Phone Number: 08/29/2020, 5:13 PM  Clinical Narrative:      CSW spoke with the patient at bedside and completed very brief SDOH screening with the patient who reported having some financial concerns but will reach out if he needs support. Glenn [name] reported that he does have a primary care but he is looking into finding a different PCP and CSW offered support if needed. Patient reported they can get to the pharmacy to pick up his medications. CSW provided the patient with the social workers name, number and position and if they identify other needs to please reach out so that CSW can provide support.  CSW will continue to follow throughout discharge.     Barriers to Discharge: Continued Medical Work up   Patient Goals and CMS Choice        Expected Discharge Plan and Services   In-house Referral: Clinical Social Work     Living arrangements for the past 2 months: Single Family Home                                      Prior Living Arrangements/Services Living arrangements for the past 2 months: Single Family Home Lives with:: Self,Roommate Patient language and need for interpreter reviewed:: Yes        Need for Family Participation in Patient Care: No (Comment) Care giver support system in place?: No (comment)   Criminal Activity/Legal Involvement Pertinent to Current Situation/Hospitalization: No - Comment as needed  Activities of Daily Living Home Assistive Devices/Equipment: None ADL Screening (condition at time of admission) Patient's cognitive ability adequate to safely complete daily activities?: Yes Is the patient deaf or have difficulty hearing?: No Does the patient have difficulty seeing, even when wearing glasses/contacts?: No Does the  patient have difficulty concentrating, remembering, or making decisions?: No Patient able to express need for assistance with ADLs?: Yes Does the patient have difficulty dressing or bathing?: No Independently performs ADLs?: Yes (appropriate for developmental age) Does the patient have difficulty walking or climbing stairs?: Yes Weakness of Legs: None Weakness of Arms/Hands: None  Permission Sought/Granted                  Emotional Assessment Appearance:: Appears stated age Attitude/Demeanor/Rapport: Engaged Affect (typically observed): Pleasant Orientation: : Oriented to Self,Oriented to Place,Oriented to  Time,Oriented to Situation   Psych Involvement: No (comment)  Admission diagnosis:  Generalized edema [R60.1] Acute systolic CHF (congestive heart failure) (HCC) [I50.21] Heart failure with reduced ejection fraction (HCC) [I50.20] Patient Active Problem List   Diagnosis Date Noted  . Heart failure with reduced ejection fraction (HCC) 08/26/2020  . Acute systolic CHF (congestive heart failure) (HCC)   . Pulmonary emboli (HCC) 05/23/2020  . Crohn's disease (HCC) 05/23/2020  . Pericardial effusion 05/23/2020  . Deep vein thrombosis (DVT) of left lower extremity (HCC) 05/23/2020  . Abdominal pain 10/15/2017  . Perforated appendicitis 10/01/2017  . Migraine without aura and with status migrainosus, not intractable 10/19/2012  . ADHD (attention deficit hyperactivity disorder) 10/19/2012  . Circadian rhythm sleep disorder, 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   PCP:  Glenn Gully, MD Pharmacy:   [id number] [address] [address], [place] [phone] [place] [address] Phone: [phone] Fax: [phone]     Social Determinants of Health (SDOH) Interventions Food Insecurity Interventions: Intervention Not Indicated Financial Strain Interventions: Other (Comment) (referrals to local agencies and patient care fund) Housing Interventions:  Intervention Not Indicated Transportation Interventions: Intervention Not Indicated  Readmission Risk Interventions No flowsheet data found.  Glenn [name], MSW, LCSWA [phone] Heart Failure Social Worker

## 2020-08-29 NOTE — Plan of Care (Signed)

## 2020-08-30 ENCOUNTER — Telehealth (HOSPITAL_COMMUNITY): Payer: Self-pay | Admitting: Surgery

## 2020-08-30 DIAGNOSIS — K9049 Malabsorption due to intolerance, not elsewhere classified: Secondary | ICD-10-CM

## 2020-08-30 DIAGNOSIS — R768 Other specified abnormal immunological findings in serum: Secondary | ICD-10-CM

## 2020-08-30 LAB — BASIC METABOLIC PANEL
Anion gap: 7 (ref 5–15)
BUN: 12 mg/dL (ref 6–20)
CO2: 24 mmol/L (ref 22–32)
Calcium: 6.7 mg/dL — ABNORMAL LOW (ref 8.9–10.3)
Chloride: 100 mmol/L (ref 98–111)
Creatinine, Ser: 0.99 mg/dL (ref 0.61–1.24)
GFR, Estimated: >60 mL/min (ref >60)
Glucose, Bld: 88 mg/dL (ref 70–99)
Potassium: 4.1 mmol/L (ref 3.5–5.1)
Sodium: 131 mmol/L — ABNORMAL LOW (ref 135–145)

## 2020-08-30 MED ORDER — SPIRONOLACTONE 25 MG PO TABS: 25.0000 mg | ORAL_TABLET | Freq: Every day | ORAL | 5 refills | Status: AC

## 2020-08-30 MED ORDER — SACUBITRIL-VALSARTAN 24-26 MG PO TABS: 1.0000 | ORAL_TABLET | Freq: Two times a day (BID) | ORAL | 3 refills | Status: DC

## 2020-08-30 MED ORDER — IVABRADINE HCL 7.5 MG PO TABS: 7.5000 mg | ORAL_TABLET | Freq: Two times a day (BID) | ORAL | 3 refills | Status: AC

## 2020-08-30 NOTE — Progress Notes (Signed)
Patient provided with discharge education and materials, verbalized understanding. IV access and telemetry monitor removed, no issues noted at this time. Patient discharged from unit with all belongings to personal vehicle.  

## 2020-08-30 NOTE — Discharge Summary (Signed)
Advanced Heart Failure Team  Discharge Summary   Patient ID: Glenn [name] MRN: [id number], DOB/AGE: 2000/08/01 20 y.o. 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 Reverse Takotsubo), and DVT/PE.  Presented to ED 2/23/22w/ complaints of SOB, cough and dizziness and found to have extensive acute bilateral PE w/ evidence of RV strain as well as moderate sized pericardial effusion. LE venous dopplers + for Lt sided DVT. 2D echo showed BiV dysfunction. LVEF 30-35% w/ global hypokensis. RV moderately enlarged w/ moderately reduced systolic function. Moderate TR. No prior studies for comparison. Cardiology initially consulted. Given hemodynamic stability, there was no immediate indication for TPA nor pericardiocentesis. He was placed on IV heparin.   AHF team wasconsultedgiven increasing volume overload and persistent tachycardia and hypotension. HR on admission was in the 140s but had improved down to the 110s. Had f/u limited echo and pericardial effusion seems slightly smaller.cMRI showed nomyocardial LGE, so no definitive evidence for prior MI, infiltrative disease, or myocarditis.Diffuse HK towards the base and less towards the apex.? Reverse Takotsubo pattern. Pericardial effusion on MRI felt to be moderate but no tamponade. He was discharged on GDMT and Eliquis. Discharge weight 179 lbs.  Had post hospital f/u at [facility] on 3/9. Volume status ok w/ NYHA Class II symptoms. Losartan transitioned to [place]. Ivabradine increased to 7.5 bid. He was instructed to return back for  further med titration w/ plans to repeat echo in 3 months but not seen since post hospital. Per pt report, he was unable to get additional refills of Entresto and stopped after he finished the first 30 day supply. He was still taking spiro and ivabraine.   He presented to the [facility] on 5/29 w/ bilateral leg swelling, progressively worsening over the last 2 weeks. Denies dyspnea. Reports full med compliance. In ED, CMP showed severe hypoalbuminemia <1.0. UA negative for proteinuria. LFTs normal. Bilateral LE venous dopplers negative for DVT. BNP normal at 53.1. CXR w/o acute cardiopulmonary disease. EKG NSR 96 bpm. Echo repeated and improved. LVEF now 50-55%. RV mildly enlarged w/ normal systolic function. Estimated RVSP 27.8 mmHg. Mild TR and small pericardial effusion.   The peripheral edema does not seem to be CHF-related. His LV EF has improved to 50-55%(consistent with recovery of a Takotsubo-type cardiomyopathy). Normal BNP. JVP not significantly elevated and IVC small on echo. LE Venous dopplers negative for DVT. I suspect that his peripheral edema is related to his low oncotic pressure with albumin <1. He does not have proteinuria on UA, given his Crohns history, I am concerned for a protein-losing enteropathy versus malabsorption.  Albumin level has steadily fallen over time.  - Would continue cardiac regimen for now, Entresto/ivabradine/spironolactone.  -Unna boots on, would send home with unna boots. -Have askedGI to see given Crohns and concern for a protein-losing enteropathy versus malabsorption. No new recommendations, followup with outpatient GI.  I am very concerned in this young patient with very low albumin, worry about Crohns complication, may end up needing TPN if malabsorbing.  Will make sure he has close GI followup as outpatient.  - Think we can hold off  on RHC.   Also ANA+ with SSA+.  He will be referred to rheumatology for further w/u.   On 6/2, he was last seen and  examined by Dr. Shirlee Latch and felt stable for d/c home. Post hospital f/u arranged in [facility] + w/ GI. Rheumatology referral placed.     Discharge Weight Range: 194 lb  Discharge Vitals: Blood pressure (!) 110/57, pulse 98, temperature 98.7 F (37.1 C), temperature source Oral, resp. rate 18, weight 88.2 kg, SpO2 98 %.  Labs: Lab Results  Component Value Date   WBC 5.1 08/28/2020   HGB 9.2 (L) 08/28/2020   HCT 27.4 (L) 08/28/2020   MCV 81.5 08/28/2020   PLT 309 08/28/2020    Recent Labs  Lab 08/26/20 1615 08/26/20 2323 08/30/20 0328  NA 129*   < > 131*  K 2.7*   < > 4.1  CL 98   < > 100  CO2 25   < > 24  BUN 11   < > 12  CREATININE 1.19   < > 0.99  CALCIUM 6.2*   < > 6.7*  PROT 3.0*  --   --   BILITOT 0.2*  --   --   ALKPHOS 92  --   --   ALT 39  --   --   AST 21  --   --   GLUCOSE 95   < > 88   < > = values in this interval not displayed.   No results found for: CHOL, HDL, LDLCALC, TRIG BNP (last 3 results) Recent Labs    05/23/20 1307 08/18/20 1200 08/26/20 1615  BNP 355.4* 44.5 53.1    ProBNP (last 3 results) No results for input(s): PROBNP in the last 8760 hours.   Diagnostic Studies/Procedures   No results found.  Discharge Medications   Allergies as of 08/30/2020      Reactions   Penicillins Hives   Amoxicillin Hives   Cefdinir Hives      Medication List    STOP taking these medications   furosemide 20 MG tablet Commonly known as: LASIX   ondansetron 4 MG tablet Commonly known as: ZOFRAN     TAKE these medications   apixaban 5 MG Tabs tablet Commonly known as: ELIQUIS Take 1 tablet (5 mg total) by mouth 2 (two) times daily.   ferrous sulfate 325 (65 FE) MG tablet Take 1 tablet (325 mg total) by mouth daily with breakfast.   ivabradine 7.5 MG Tabs tablet Commonly known as: CORLANOR Take 1 tablet (7.5 mg total) by mouth 2 (two) times daily with a meal.   Mesalamine 400 MG Cpdr DR capsule Commonly known as: ASACOL Take 1 capsule (400  mg total) by mouth daily.   ondansetron 4 MG disintegrating tablet Commonly known as: Zofran ODT Take 1 tablet (4 mg total) by mouth every 8 (eight) hours as needed for nausea or vomiting.   pantoprazole 40 MG tablet Commonly known as: PROTONIX Take 1 tablet (40 mg total) by mouth daily.   sacubitril-valsartan 24-26 MG Commonly known as: ENTRESTO Take 1 tablet by mouth 2 (two) times daily.   spironolactone 25 MG tablet Commonly known as: ALDACTONE Take 1 tablet (25 mg total) by mouth daily.   SUMAtriptan 25 MG tablet Commonly known as: Imitrex Take 1 tablet (25 mg total) by mouth once for 1 dose. May repeat in 2 hours if headache persists or recurs.       Disposition   The patient will be discharged  in stable condition to home.   Follow-up Information    [facility] Follow up.   Specialty: Cardiology Why: September 28, 2020 at 10:30 AM at the Advanced Heart Failure Clinic at [facility], Entrance C Parking Garage Code 3342  Contact information: [address] [id number] mc [place] [address] [phone]       [facility] Follow up on 10/04/2020.   Why:  Post hospital follow up appointment and to establish primary care scheduled with Georgian Co PA  on 10/04/2020 at 3:30 pm Contact information: [address] [place] [address] [phone]       [facility]. Go on 09/25/2020.   Specialty: Wound Care Why: 1:15 pm Contact information: [address] [id number] [place] [address] [phone]       Vida Rigger, MD. Go to.   Specialty: Gastroenterology Why: September 19, 2020 8:45 AM  Contact information: [address] [place] [address] [phone]                 Duration of Discharge Encounter: Greater than 35 minutes   Signed, Knute Neu  08/30/2020,  4:27 PM

## 2020-08-30 NOTE — Telephone Encounter (Signed)
Patient is admitted and needs an ambulatory referral for Rheumatology.  Order placed into [facility].

## 2020-08-30 NOTE — Progress Notes (Signed)
Orthopedic Tech Progress Note Patient Details:  Glenn [name] 20-Jan-2001 [id number]  Ortho Devices Type of Ortho Device: Roland Rack boot Ortho Device/Splint Location: BLE Ortho Device/Splint Interventions: Ordered,Application   Post Interventions Patient Tolerated: Well Instructions Provided: Care of device,Poper ambulation with device   Glenn [name] 08/30/2020, 2:33 PM

## 2020-08-30 NOTE — TOC Transition Note (Signed)
Transition of Care [facility]) - CM/SW Discharge Note   Patient Details  Name: Glenn [name] MRN: [id number] Date of Birth: 2000/09/09  [facility]) CM/SW Contact:  Epifanio Lesches, RN Phone Number: 08/30/2020, 2:55 PM   Clinical Narrative:    Patient will DC to: home Anticipated DC date: 08/30/2020 Family notified: yes Transport by: car  Admitted with LLE, hx of Crohn's disease, asthma, DVT/PE andHFrEF (biventricular dysfunction w LVEF 25%. Hospital course with noted severe hypoalbuminemia. 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 PTA independent with ADL's, no DME usage.  Per MD patient ready for DC today.RN, patient, and patient's family aware of DC. Pt states he's ok with caring for self. States roommate will help if needed.  Pt with unna boots, LE. Provider states pt can remove boots in a couple of days and replace with TED hose. Order noted for TED hose, bedside nurse to provide to pt prior to d/c.  Pt without Rx med concerns or affordability issues.  NCM shared [facility] with pt, pt interested in new PCP. [facility] information provided. Pt stated he would like for NCM to arrange f/u @ [facility].   Hospital f/u appointments noted on AVS.  RNCM will sign off for now as intervention is no longer needed. Please consult [place] again if new needs arise.    Final next level of care: Home/Self Care Barriers to Discharge: No Barriers Identified   Patient Goals and CMS Choice        Discharge Placement                       Discharge Plan and Services In-house Referral: Clinical Social Work                                   Social Determinants of Health (SDOH) Interventions Food Insecurity Interventions: Intervention Not Indicated Financial Strain Interventions: Other (Comment) (referrals to local agencies and patient care fund) Housing Interventions: Intervention Not Indicated Transportation Interventions: Intervention Not  Indicated   Readmission Risk Interventions No flowsheet data found.

## 2020-08-30 NOTE — Progress Notes (Addendum)
Advanced Heart Failure Rounding Note  PCP-Cardiologist: None   Subjective:    Feels ok. Legs feeling better w/ unna boots. Plans to get rewrapped before d/c.   ANA +  SSA (Ro) (ENA) Antibody IgG elevated at 4.1   Objective:   Weight Range: 88.2 kg Body mass index is 23.68 kg/m.   Vital Signs:   Temp:  [97.9 F (36.6 C)-98.3 F (36.8 C)] 97.9 F (36.6 C) (06/02 0752) Pulse Rate:  [88-123] 91 (06/02 0752) Resp:  [16-17] 17 (06/02 0752) BP: (93-117)/(47-75) 117/75 (06/02 0752) SpO2:  [96 %-99 %] 96 % (06/02 0752) Weight:  [88.2 kg] 88.2 kg (06/02 0522) Last BM Date: 08/29/20  Weight change: Filed Weights   08/28/20 0507 08/29/20 0640 08/30/20 0522  Weight: 87.3 kg 87.8 kg 88.2 kg    Intake/Output:   Intake/Output Summary (Last 24 hours) at 08/30/2020 1011 Last data filed at 08/30/2020 0913 Gross per 24 hour  Intake 483 ml  Output 301 ml  Net 182 ml      Physical Exam    PHYSICAL EXAM: General:  Well appearing. No respiratory difficulty HEENT: normal Neck: supple. no JVD. Carotids 2+ bilat; no bruits. No lymphadenopathy or thyromegaly appreciated. Cor: PMI nondisplaced. Regular rate & rhythm. No rubs, gallops or murmurs. Lungs: clear Abdomen: soft, nontender, nondistended. No hepatosplenomegaly. No bruits or masses. Good bowel sounds. Extremities: no cyanosis, clubbing, rash, 1+ bilateral LE + unna boots  Neuro: alert & oriented x 3, cranial nerves grossly intact. moves all 4 extremities w/o difficulty. Affect pleasant.  Telemetry   N/A   EKG    No new EKG to review   Labs    CBC Recent Labs    08/28/20 0344  WBC 5.1  NEUTROABS 3.2  HGB 9.2*  HCT 27.4*  MCV 81.5  PLT 309   Basic Metabolic Panel Recent Labs    16/10/96 0305 08/30/20 0328  NA 132* 131*  K 4.2 4.1  CL 100 100  CO2 29 24  GLUCOSE 78 88  BUN 10 12  CREATININE 1.04 0.99  CALCIUM 6.7* 6.7*   Liver Function Tests No results for input(s): AST, ALT, ALKPHOS, BILITOT,  PROT, ALBUMIN in the last 72 hours. No results for input(s): LIPASE, AMYLASE in the last 72 hours. Cardiac Enzymes No results for input(s): CKTOTAL, CKMB, CKMBINDEX, TROPONINI in the last 72 hours.  BNP: BNP (last 3 results) Recent Labs    05/23/20 1307 08/18/20 1200 08/26/20 1615  BNP 355.4* 44.5 53.1    ProBNP (last 3 results) No results for input(s): PROBNP in the last 8760 hours.   D-Dimer No results for input(s): DDIMER in the last 72 hours. Hemoglobin A1C No results for input(s): HGBA1C in the last 72 hours. Fasting Lipid Panel No results for input(s): CHOL, HDL, LDLCALC, TRIG, CHOLHDL, LDLDIRECT in the last 72 hours. Thyroid Function Tests No results for input(s): TSH, T4TOTAL, T3FREE, THYROIDAB in the last 72 hours.  Invalid input(s): FREET3  Other results:   Imaging    No results found.   Medications:     Scheduled Medications: . apixaban  5 mg Oral BID  . ivabradine  7.5 mg Oral BID WC  . Mesalamine  400 mg Oral Daily  . multivitamin with minerals  1 tablet Oral Daily  . pantoprazole  40 mg Oral Daily  . sacubitril-valsartan  1 tablet Oral BID  . sodium chloride flush  3 mL Intravenous Q12H  . spironolactone  25 mg Oral Daily  Infusions: . sodium chloride      PRN Medications: sodium chloride, acetaminophen, ondansetron (ZOFRAN) IV, sodium chloride flush     Assessment/Plan   1. Bilateral LEE  - h/o DVT 2/22. Remains on Eliquis w/ full compliance. F/u venous dopplers this admit negative for DVT - BNP normal at 53. Echo w/ improved BiV function. LVEF normalized 50-55%. RV systolic function normal. RVSP 27   - Edema 2/2 severe hypoalbuminemia <1.0. UA negative for proteinuria. 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 Ewing Schlein post d/c  - increase protein portions w/ meals per RD  - don't think RHC would be useful   2. H/o Biventricular Heart Failure - Echo 2/22 at time of PE  diagnosis showed severe RV dysfunction with McConnell's sign and moderate RV enlargement. LV systolic function also severely reduced at 30%. cMRI showed nomyocardial LGE. 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  - Repeat Echo this admit shows normalized LVEF, now 50-55%. 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 BNP normal at 53 and CXR negative. No symptoms of pulmonary congestion.  - Continue Spiro 25 + Entresto 24-26 mg bid + ivabradine 7.5 bid   3. H/o Unprovoked DVT/PE  - continue Eliquis - repeat venous dopplers negative for DVT - continue w/u for lupus (see below)   4. Crohn's - on Mesalamine - followed by Dr. Claretha Cooper - GI saw yesterday. Feel Crohn's disease at this point is clinically quiescent  5. + ANA. SSA (Ro) (ENA) Antibody IgG elevated at 4.1 - Mother had an autoimmune disorder. Specifics unknown but she apparently died from autoimmune hepatitis at age 39. Given family history, severe hypoalbuminemia, appearance of malar rash and h/o unprovoked DVT/PE, ? Lupus. - refer to outpatient rheumatology for further w/u  Plan d/c home later today.   Length of Stay: [address], PA-C  08/30/2020, 10:11 AM  Advanced Heart Failure Team Pager [phone] (M-F; 7a - 5p)  Please contact [facility] for night-coverage after hours (5p -7a ) and weekends on [email or web address]  Patient seen with PA, agree with the above note.   No complaints today.  ANA+ with SSA+.    General: NAD Neck: No JVD, no thyromegaly or thyroid nodule.  Lungs: Clear to auscultation bilaterally with normal respiratory effort. CV: Nondisplaced PMI.  Heart regular S1/S2, no S3/S4, no murmur.  2+ edema to knees.  Abdomen: Soft, nontender, no hepatosplenomegaly, no distention.   Skin: Intact without lesions or rashes.  Neurologic: Alert and oriented x 3.  Psych: Normal affect. Extremities: No clubbing or cyanosis.  HEENT: Normal.   The peripheral edema does not seem to be CHF-related. His LV EF has improved to 50-55% (consistent with recovery of a Takotsubo-type cardiomyopathy). Normal BNP. JVP not significantly elevated and IVC small on echo. I suspect that his peripheral edema is related to his low oncotic pressure with albumin <1. He does not have proteinuria on UA, given his Crohns history, I am concerned for a protein-losing enteropathy versus malabsorption.  Albumin level has steadily fallen over time.  - Would continue cardiac regimen for now, Entresto/ivabradine/spironolactone.  - Unna boots on, would send home with unna boots. - Have asked GI to see given Crohns and concern for a protein-losing enteropathy versus malabsorption. No new recommendations,  followup with outpatient GI.  I am very concerned in this young patient with very low albumin, worry about Crohns complication, may end up needing TPN if malabsorbing.  Will make sure he has close GI followup as outpatient.  - Think we can hold off on RHC.   If no further GI workup for protein loss versus malabsorption, will let him go home today with close GI followup.  Will also need rheumatology followup with elevated ANA.   Marca Ancona 08/30/2020 12:30 PM

## 2020-09-25 ENCOUNTER — Encounter (HOSPITAL_BASED_OUTPATIENT_CLINIC_OR_DEPARTMENT_OTHER): Payer: BC Managed Care – PPO | Attending: Internal Medicine | Admitting: Internal Medicine

## 2020-09-25 ENCOUNTER — Other Ambulatory Visit: Payer: Self-pay

## 2020-09-25 DIAGNOSIS — I5022 Chronic systolic (congestive) heart failure: Secondary | ICD-10-CM | POA: Insufficient documentation

## 2020-09-25 DIAGNOSIS — K5090 Crohn's disease, unspecified, without complications: Secondary | ICD-10-CM | POA: Diagnosis not present

## 2020-09-25 DIAGNOSIS — R609 Edema, unspecified: Secondary | ICD-10-CM | POA: Diagnosis not present

## 2020-09-25 DIAGNOSIS — Z87891 Personal history of nicotine dependence: Secondary | ICD-10-CM | POA: Diagnosis not present

## 2020-09-25 DIAGNOSIS — Z86711 Personal history of pulmonary embolism: Secondary | ICD-10-CM | POA: Diagnosis not present

## 2020-09-25 DIAGNOSIS — Z86718 Personal history of other venous thrombosis and embolism: Secondary | ICD-10-CM | POA: Insufficient documentation

## 2020-09-25 DIAGNOSIS — J45909 Unspecified asthma, uncomplicated: Secondary | ICD-10-CM | POA: Insufficient documentation

## 2020-09-25 DIAGNOSIS — M069 Rheumatoid arthritis, unspecified: Secondary | ICD-10-CM | POA: Diagnosis not present

## 2020-09-25 DIAGNOSIS — Z7901 Long term (current) use of anticoagulants: Secondary | ICD-10-CM | POA: Diagnosis not present

## 2020-09-28 ENCOUNTER — Other Ambulatory Visit: Payer: Self-pay

## 2020-09-28 ENCOUNTER — Encounter (HOSPITAL_COMMUNITY): Payer: Self-pay

## 2020-09-28 ENCOUNTER — Ambulatory Visit (HOSPITAL_COMMUNITY)
Admission: RE | Admit: 2020-09-28 | Discharge: 2020-09-28 | Disposition: A | Discharge status: Home / Self Care | Payer: BC Managed Care – PPO | Source: Ambulatory Visit | Attending: Family Medicine | Admitting: Family Medicine

## 2020-09-28 VITALS — BP 92/56 | HR 120 | Ht 76.0 in | Wt 203.0 lb

## 2020-09-28 DIAGNOSIS — I5022 Chronic systolic (congestive) heart failure: Secondary | ICD-10-CM | POA: Diagnosis not present

## 2020-09-28 DIAGNOSIS — K5090 Crohn's disease, unspecified, without complications: Secondary | ICD-10-CM | POA: Diagnosis not present

## 2020-09-28 DIAGNOSIS — Z7901 Long term (current) use of anticoagulants: Secondary | ICD-10-CM | POA: Diagnosis not present

## 2020-09-28 DIAGNOSIS — Z86711 Personal history of pulmonary embolism: Secondary | ICD-10-CM | POA: Diagnosis not present

## 2020-09-28 DIAGNOSIS — R768 Other specified abnormal immunological findings in serum: Secondary | ICD-10-CM

## 2020-09-28 DIAGNOSIS — I5082 Biventricular heart failure: Secondary | ICD-10-CM | POA: Diagnosis present

## 2020-09-28 DIAGNOSIS — R600 Localized edema: Secondary | ICD-10-CM | POA: Diagnosis not present

## 2020-09-28 DIAGNOSIS — Z86718 Personal history of other venous thrombosis and embolism: Secondary | ICD-10-CM | POA: Diagnosis not present

## 2020-09-28 DIAGNOSIS — Z87891 Personal history of nicotine dependence: Secondary | ICD-10-CM | POA: Insufficient documentation

## 2020-09-28 DIAGNOSIS — Z8616 Personal history of COVID-19: Secondary | ICD-10-CM | POA: Diagnosis not present

## 2020-09-28 DIAGNOSIS — K50919 Crohn's disease, unspecified, with unspecified complications: Secondary | ICD-10-CM | POA: Diagnosis not present

## 2020-09-28 DIAGNOSIS — Z79899 Other long term (current) drug therapy: Secondary | ICD-10-CM | POA: Insufficient documentation

## 2020-09-28 LAB — BASIC METABOLIC PANEL
Anion gap: 3 — ABNORMAL LOW (ref 5–15)
BUN: 8 mg/dL (ref 6–20)
CO2: 26 mmol/L (ref 22–32)
Calcium: 6.8 mg/dL — ABNORMAL LOW (ref 8.9–10.3)
Chloride: 107 mmol/L (ref 98–111)
Creatinine, Ser: 1.21 mg/dL (ref 0.61–1.24)
GFR, Estimated: >60 mL/min (ref >60)
Glucose, Bld: 88 mg/dL (ref 70–99)
Potassium: 4.3 mmol/L (ref 3.5–5.1)
Sodium: 136 mmol/L (ref 135–145)

## 2020-09-28 LAB — HEPATIC FUNCTION PANEL
ALT: 38 U/L (ref 0–44)
AST: 26 U/L (ref 15–41)
Albumin: <1 g/dL — ABNORMAL LOW (ref 3.5–5.0)
Alkaline Phosphatase: 97 U/L (ref 38–126)
Bilirubin, Direct: <0.1 mg/dL (ref 0.0–0.2)
Total Bilirubin: 0.3 mg/dL (ref 0.3–1.2)
Total Protein: 3.4 g/dL — ABNORMAL LOW (ref 6.5–8.1)

## 2020-09-28 LAB — BRAIN NATRIURETIC PEPTIDE: B Natriuretic Peptide: 64.2 pg/mL (ref 0.0–100.0)

## 2020-09-28 MED ORDER — FUROSEMIDE 40 MG PO TABS: 40.0000 mg | ORAL_TABLET | Freq: Every day | ORAL | 3 refills | Status: DC

## 2020-09-28 MED ORDER — POTASSIUM CHLORIDE CRYS ER 20 MEQ PO TBCR: 20.0000 meq | EXTENDED_RELEASE_TABLET | Freq: Every day | ORAL | 3 refills | Status: AC

## 2020-09-28 MED ORDER — ONDANSETRON 4 MG PO TBDP: 4.0000 mg | ORAL_TABLET | Freq: Three times a day (TID) | ORAL | 0 refills | Status: AC | PRN

## 2020-09-28 NOTE — Progress Notes (Signed)
Advanced Heart Failure Clinic Note   PCP: Dr. Anna Genre Primary HF Cardiologist: Dr. Shirlee Latch  HPI: 20 y/o male w/ Crohn's disease, asthma, migraine headaches and COVID infection 10/2019, chronic biventricularr failure (new, ? Reverse Takotsubo), and PE.  Presented to ED 05/23/20 w/ complaints of SOB, cough and dizziness and found to have extensive acute bilateral PE w/ evidence of RV strain as well as moderate sized pericardial effusion. LE venous dopplers + for Lt sided DVT. 2D echo showed BiV dysfunction. LVEF 30-35% w/ global hypokensis. RV moderately enlarged w/ moderately reduced systolic function. Moderate TR. No prior studies for comparison.  Cardiology initially consulted. Given hemodynamic stability, there was no immediate indication for TPA nor pericardiocentesis. He was placed on IV heparin.    AHF team consulted given increasing volume overload and persistent tachycardia and hypotension. HR on admission was in the 140s but had improved down to the 110s. Had f/u limited echo and pericardial effusion seems slightly smaller. cMRI showed no myocardial LGE, so no definitive evidence for prior MI, infiltrative disease, or myocarditis. Diffuse HK towards the base and less towards the apex. ? Reverse Takotsubo pattern. Pericardial effusion on MRI felt to be moderate but no tamponade. He was discharged on GDMT and Eliquis. Discharge weight 179 lbs.  Had post hospital f/u at [facility] on 3/9. Volume status ok w/ NYHA Class II symptoms. Losartan transitioned to [place]. Ivabradine increased to 7.5 bid. He was instructed to return back for further med titration w/ plans to repeat echo in 3 months but not seen since post hospital. Per pt report, he was unable to get additional refills of Entresto and stopped after he finished the first 30 day supply. He was still taking spiro and ivabraine.  He presented to the [facility] on 5/29 w/ bilateral leg swelling, progressively worsening over the last 2 weeks.  Denies dyspnea. Reports full med compliance. In ED, CMP showed severe hypoalbuminemia <1.0. UA negative for proteinuria. LFTs normal. Bilateral LE venous dopplers negative for DVT. BNP normal at 53.1. CXR w/o acute cardiopulmonary disease. Echo repeated and improved. LVEF now 50-55%. RV mildly enlarged w/ normal systolic function. Estimated RVSP 27.8 mmHg. Mild TR and small pericardial effusion.    The peripheral edema does not seem to be CHF-related.  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 Discharged with instructions to follow up with GI and Rheumatology (+ANA/SSA). Hold off on RHC for now. Discharge weight 194 lbs.    Today he returns for post-hospitalization HF follow up. Overall feeling bloated and nauseated. Denies increasing SOB, CP, dizziness, edema, or PND/Orthopnea. Appetite ok. No fever or chills. Weight at home 190-195 pounds, noticed it has gone up recently to 200-203. Taking all medications. Eating take out/drive through ~7-4/QVZD.Took lasix daily x 5 days when he got home from hospital.  ROS: All systems negative except as listed in HPI, PMH and Problem List.  SH:  Social History   Socioeconomic History   Marital status: Single    Spouse name: Not on file   Number of children: Not on file   Years of education: Not on file   Highest education level: Not on file  Occupational History   Not on file  Tobacco Use   Smoking status: Former    Pack years: 0.00    Types: Cigarettes    Quit date: 02/29/2020    Years since quitting: 0.5   Smokeless tobacco: Never  Vaping Use   Vaping Use: Former  Substance and Sexual Activity  Alcohol use: No   Drug use: Never   Sexual activity: Not on file  Other Topics Concern   Not on file  Social History Narrative   Not on file   Social Determinants of Health   Financial Resource Strain: High Risk   Difficulty of Paying Living Expenses: Hard  Food Insecurity: No Food Insecurity   Worried About Running Out of  Food in the Last Year: Never true   Ran Out of Food in the Last Year: Never true  Transportation Needs: No Transportation Needs   Lack of Transportation (Medical): No   Lack of Transportation (Non-Medical): No  Physical Activity: Inactive   Days of Exercise per Week: 0 days   Minutes of Exercise per Session: 0 min  Stress: Not on file  Social Connections: Not on file  Intimate Partner Violence: Not on file   FH:  Family History  Problem Relation Age of Onset   Autoimmune disease Mother    Hepatitis Mother    Seizures Cousin        Epilepsy   Past Medical History:  Diagnosis Date   Asthma    Crohn's disease (HCC) 05/23/2020   Diagnosed ~2019   Deep vein thrombosis (DVT) of left lower extremity (HCC) 05/23/2020   L femoral, L popliteal veins 05/23/2020   Headache(784.0)    Pericardial effusion 05/23/2020   Pulmonary emboli (HCC) 05/23/2020   Bilateral with e/o R heart strain on CTA   Sickle cell trait (HCC) 1999-04-02   Current Outpatient Medications  Medication Sig Dispense Refill   apixaban (ELIQUIS) 5 MG TABS tablet Take 1 tablet (5 mg total) by mouth 2 (two) times daily. 60 tablet 6   ferrous sulfate 325 (65 FE) MG tablet Take 1 tablet (325 mg total) by mouth daily with breakfast. 30 tablet 3   ivabradine (CORLANOR) 7.5 MG TABS tablet Take 1 tablet (7.5 mg total) by mouth 2 (two) times daily with a meal. 180 tablet 3   Mesalamine (ASACOL) 400 MG CPDR DR capsule Take 1 capsule (400 mg total) by mouth daily. 180 capsule 3   ondansetron (ZOFRAN ODT) 4 MG disintegrating tablet Take 1 tablet (4 mg total) by mouth every 8 (eight) hours as needed for nausea or vomiting. 20 tablet 0   pantoprazole (PROTONIX) 40 MG tablet Take 1 tablet (40 mg total) by mouth daily. 30 tablet 0   sacubitril-valsartan (ENTRESTO) 24-26 MG Take 1 tablet by mouth 2 (two) times daily. 60 tablet 3   spironolactone (ALDACTONE) 25 MG tablet Take 1 tablet (25 mg total) by mouth daily. 30 tablet 5   SUMAtriptan  (IMITREX) 25 MG tablet Take 1 tablet (25 mg total) by mouth once for 1 dose. May repeat in 2 hours if headache persists or recurs. 20 tablet 0   No current facility-administered medications for this encounter.  BP (!) 92/56   Pulse (!) 120   Ht 6\' 4"  (1.93 m)   Wt 92.1 kg   SpO2 99%   BMI 24.71 kg/m   Wt Readings from Last 3 Encounters:  09/28/20 92.1 kg  08/30/20 88.2 kg  08/18/20 81.6 kg   PHYSICAL EXAM: General:  NAD. No resp difficulty, chronically ill-appearing HEENT: Normal Neck: Supple. JVP 6-7. Carotids 2+ bilat; no bruits. No lymphadenopathy or thryomegaly appreciated. Cor: PMI nondisplaced. Tachy rate & rhythm. No rubs, gallops or murmurs. Lungs: Clear Abdomen: Soft, nontender, +distended. No hepatosplenomegaly. No bruits or masses. Good bowel sounds. Extremities: No cyanosis, clubbing, rash, 3+ LE edema  to thighs Neuro: Alert & oriented x 3, cranial nerves grossly intact. Moves all 4 extremities w/o difficulty. Affect pleasant.  ECG: ST 108 bpm (personally reviewed).  Reds: 43%  ASSESSMENT & PLAN: 1. Bilateral LEE - h/o DVT 2/22. Remains on Eliquis w/ full compliance. F/u venous dopplers this admit negative for DVT - BNP normal at 53. Echo w/ improved BiV function. LVEF normalized 50-55%. RV systolic function normal. RVSP 27   - Edema 2/2 severe hypoalbuminemia <1.0. UA negative for proteinuria. 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 - LEE is most likely entirely 2/2 severe hypoalbuminemia. BNP normal at 53 and CXR negative on recent admit. No symptoms of pulmonary congestion. - He needs to elevate legs when at home. Will try to arrange for unna boots. - Needs to f/u w/ Dr. Ewing Schlein, encouraged to call for appt. - Increase protein portions w/ meals per RD - No need for RHC. - Check albumin today.  2. H/o Biventricular Heart Failure - Echo 2/22 at time of PE diagnosis showed severe RV dysfunction with McConnell's sign and moderate RV enlargement. LV  systolic function also severely reduced at 30%. cMRI showed no myocardial LGE. 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 - Repeat Echo (5/22) shows normalized LVEF, now 50-55%. 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 Will need 20 mEq KCl daily. - Continue Spiro 25 mg daily+ Entresto 24-26 mg bid + ivabradine 7.5 mg bid. - CMET & BNP today; repeat BMET 7-10 days.   3. H/o Unprovoked DVT/PE - Continue Eliquis. No bleeding. - Venous dopplers negative for DVT.   4. Crohn's - On Mesalamine. - followed by GI.   5. + ANA. SSA (Ro) (ENA) Antibody IgG elevated at 4.1 - ? Lupus - Referred to outpatient rheumatology for further w/u.  Follow up in 3-4 weeks with APP  Prince Rome, FNP-BC 09/28/20

## 2020-09-28 NOTE — Patient Instructions (Addendum)
RedsClip done today.  EKG done today.  Labs done today. We will contact you only if your labs are abnormal.  START Potassium 20mg  (1 tablet) by mouth daily.  START Lasix 40mg  (1 tablet) by mouth 2 times daily for 3 days THEN DECREASE TO 40mg  (1 tablet) by mouth daily.   No other medication changes were made. Please continue all current medications as prescribed.  Your physician recommends that you schedule a follow-up appointment in: 10 days for a lab only appointment and in 4-6 weeks with our APP Clinic here in our office.  If you have any questions or concerns before your next appointment please send a message through [place] or call our office at [phone].    TO LEAVE A MESSAGE FOR THE NURSE SELECT OPTION 2, PLEASE LEAVE A MESSAGE INCLUDING: YOUR NAME DATE OF BIRTH CALL BACK NUMBER REASON FOR CALL**this is important as we prioritize the call backs  YOU WILL RECEIVE A CALL BACK THE SAME DAY AS LONG AS YOU CALL BEFORE 4:00 PM   Do the following things EVERYDAY: Weigh yourself in the morning before breakfast. Write it down and keep it in a log. Take your medicines as prescribed Eat low salt foods--Limit salt (sodium) to 2000 mg per day.  Stay as active as you can everyday Limit all fluids for the day to less than 2 liters   At the [facility], you and your health needs are our priority. As part of our continuing mission to provide you with exceptional heart care, we have created designated Provider Care Teams. These Care Teams include your primary Cardiologist (physician) and Advanced Practice Providers (APPs- Physician Assistants and Nurse Practitioners) who all work together to provide you with the care you need, when you need it.   You may see any of the following providers on your designated Care Team at your next follow up: Dr [place] Dr [place], [place] [phone], Arvilla Meres Carron Curie, PharmD   Please be sure to bring in  all your medications bottles to every appointment.

## 2020-09-28 NOTE — Progress Notes (Signed)
ReDS Vest / Clip - 09/28/20 1100       ReDS Vest / Clip   Station Marker C    Ruler Value 28    ReDS Value Range High volume overload    ReDS Actual Value 43    Anatomical Comments sitting

## 2020-10-04 ENCOUNTER — Inpatient Hospital Stay: Payer: BC Managed Care – PPO | Admitting: Physician Assistant

## 2020-10-08 ENCOUNTER — Other Ambulatory Visit: Payer: Self-pay

## 2020-10-08 ENCOUNTER — Ambulatory Visit (HOSPITAL_COMMUNITY)
Admission: RE | Admit: 2020-10-08 | Discharge: 2020-10-08 | Disposition: A | Discharge status: Home / Self Care | Payer: BC Managed Care – PPO | Source: Ambulatory Visit | Attending: Internal Medicine | Admitting: Internal Medicine

## 2020-10-08 DIAGNOSIS — I5020 Unspecified systolic (congestive) heart failure: Secondary | ICD-10-CM | POA: Diagnosis not present

## 2020-10-08 LAB — BASIC METABOLIC PANEL
Anion gap: 3 — ABNORMAL LOW (ref 5–15)
BUN: 10 mg/dL (ref 6–20)
CO2: 29 mmol/L (ref 22–32)
Calcium: 6.9 mg/dL — ABNORMAL LOW (ref 8.9–10.3)
Chloride: 104 mmol/L (ref 98–111)
Creatinine, Ser: 0.99 mg/dL (ref 0.61–1.24)
GFR, Estimated: >60 mL/min (ref >60)
Glucose, Bld: 87 mg/dL (ref 70–99)
Potassium: 3.6 mmol/L (ref 3.5–5.1)
Sodium: 136 mmol/L (ref 135–145)

## 2020-10-16 NOTE — Progress Notes (Signed)
Joo, Chaze Mervyn Skeeters ([id number]) Visit Report for 09/25/2020 Chief Complaint Document Details Patient Name: Date of Service: [facility], [place] V ID A. 09/25/2020 1:15 PM Medical Record Number: [id number] Patient Account Number: [id number] Date of Birth/Sex: Treating RN: Sep 06, 2000 (20 y.o. Male) Fonnie Mu Primary Care Provider: Anna Genre Other Clinician: Referring Provider: Treating Provider/Extender: Nuala Alpha in Treatment: 0 Information Obtained from: Patient Chief Complaint 09/25/2020; patient arrives in clinic today for uncertain reasons. Electronic Signature(s) Signed: 09/25/2020 5:29:17 PM By: Baltazar Najjar MD Entered By: Baltazar Najjar on 09/25/2020 14:49:39 -------------------------------------------------------------------------------- HPI Details Patient Name: Date of Service: [facility], [name] 09/25/2020 1:15 PM Medical Record Number: [id number] Patient Account Number: [id number] Date of Birth/Sex: Treating RN: 13-Nov-2000 (20 y.o. Male) Fonnie Mu Primary Care Provider: Anna Genre Other Clinician: Referring Provider: Treating Provider/Extender: Nuala Alpha in Treatment: 0 History of Present Illness HPI Description: Admission 09/25/2020 This is a 20 year old man who spent a complex hospitalization from 08/18/2020 through 08/30/2020. He was discovered to have bilateral PEs and a pericardial effusion. He had a reduced ejection fraction of 30 to 35% and decreased right ventricular ejection fraction. He had a cardiac MRI that did not show any specific pathology. His echo was repeated before he left the hospital and it was improved. I am not sure what the etiology of his heart failure was but was suggested to have revere T akostsubo pattern. He had a cardiac MRI. He was noted to have a moderate pericardial effusion but no evidence of tamponade. Also interestingly his albumin was less than 1 in the setting  of Crohn's disease. He does not appear to be spilling protein in his urine. The patient has an appoint with cardiology on 7/1, with rheumatology on 7/11 for positive ANA and SSA. I am not really sure why he is referred here. He is very asked significant lower extremity swelling however this is pitting no doubt related to one of the problems listed above. Past medical history includes systolic congestive heart failure /l/ blends that is improved on subsequent echo. History of Crohn's disease, history of PE on Eliquis which she is taking, asthma, migraines We could not get ABIs in this clinic because the amount of edema in his lower legs. Electronic Signature(s) Signed: 09/25/2020 5:29:17 PM By: Baltazar Najjar MD Entered By: Baltazar Najjar on 09/25/2020 14:55:29 -------------------------------------------------------------------------------- Physical Exam Details Patient Name: Date of Service: [facility], [name] 09/25/2020 1:15 PM Medical Record Number: [id number] Patient Account Number: [id number] Date of Birth/Sex: Treating RN: Mar 15, 2001 (20 y.o. Male) Fonnie Mu Primary Care Provider: Anna Genre Other Clinician: Referring Provider: Treating Provider/Extender: Nuala Alpha in Treatment: 0 Constitutional Patient is hypotensive. However is not hypotensive.. Patient is tachycardic. Respirations regular, non-labored and within target range.. Temperature is normal and within the target range for the patient.. Awake and conversational. He is not sure why he is here. Respiratory work of breathing is normal. Bilateral breath sounds are clear and equal in all lobes with no wheezes, rales or rhonchi.. Cardiovascular Heart rhythm and rate regular, without murmur or gallop. JVP is not elevated. Scant sacral edema. Symmetric pitting edema well up into his thighs. Integumentary (Hair, Skin) Patient has no systemic rashes.. Psychiatric appears at normal  baseline. Notes Wound exam; there is no open wound Electronic Signature(s) Signed: 09/25/2020 5:29:17 PM By: Baltazar Najjar MD Entered By: Baltazar Najjar on 09/25/2020 14:57:18 -------------------------------------------------------------------------------- Physician Orders Details Patient Name: Date of Service: [facility], [place]  V ID A. 09/25/2020 1:15 PM Medical Record Number: [id number] Patient Account Number: [id number] Date of Birth/Sex: Treating RN: 05-Sep-2000 (20 y.o. Male) Fonnie Mu Primary Care Provider: Anna Genre Other Clinician: Referring Provider: Treating Provider/Extender: Nuala Alpha in Treatment: 0 Verbal / Phone Orders: No Diagnosis Coding Discharge From [facility] Services Discharge from Wound Care Center Electronic Signature(s) Signed: 09/25/2020 5:29:17 PM By: Baltazar Najjar MD Signed: 09/25/2020 5:48:53 PM By: Fonnie Mu RN Entered By: Fonnie Mu on 09/25/2020 14:36:37 -------------------------------------------------------------------------------- Problem List Details Patient Name: Date of Service: [facility], [name] 09/25/2020 1:15 PM Medical Record Number: [id number] Patient Account Number: [id number] Date of Birth/Sex: Treating RN: [birth year] (20 y.o. Male) Fonnie Mu Primary Care Provider: Anna Genre Other Clinician: Referring Provider: Treating Provider/Extender: Nuala Alpha in Treatment: 0 Active Problems ICD-10 Encounter Code Description Active Date MDM Diagnosis I50.22 Chronic systolic (congestive) heart failure 09/25/2020 No Yes Z86.711 Personal history of pulmonary embolism 09/25/2020 No Yes Inactive Problems Resolved Problems Electronic Signature(s) Signed: 09/25/2020 5:29:17 PM By: Baltazar Najjar MD Entered By: Baltazar Najjar on 09/25/2020 14:46:07 -------------------------------------------------------------------------------- Progress Note  Details Patient Name: Date of Service: [facility], [name] 09/25/2020 1:15 PM Medical Record Number: [id number] Patient Account Number: [id number] Date of Birth/Sex: Treating RN: 05/08/2000 (20 y.o. Male) Fonnie Mu Primary Care Provider: Anna Genre Other Clinician: Referring Provider: Treating Provider/Extender: Nuala Alpha in Treatment: 0 Subjective Chief Complaint Information obtained from Patient 09/25/2020; patient arrives in clinic today for uncertain reasons. History of Present Illness (HPI) Admission 09/25/2020 This is a 20 year old man who spent a complex hospitalization from 08/18/2020 through 08/30/2020. He was discovered to have bilateral PEs and a pericardial effusion. He had a reduced ejection fraction of 30 to 35% and decreased right ventricular ejection fraction. He had a cardiac MRI that did not show any specific pathology. His echo was repeated before he left the hospital and it was improved. I am not sure what the etiology of his heart failure was but was suggested to have revere T akostsubo pattern. He had a cardiac MRI. He was noted to have a moderate pericardial effusion but no evidence of tamponade. Also interestingly his albumin was less than 1 in the setting of Crohn's disease. He does not appear to be spilling protein in his urine. The patient has an appoint with cardiology on 7/1, with rheumatology on 7/11 for positive ANA and SSA. I am not really sure why he is referred here. He is very asked significant lower extremity swelling however this is pitting no doubt related to one of the problems listed above. Past medical history includes systolic congestive heart failure /l/ blends that is improved on subsequent echo. History of Crohn's disease, history of PE on Eliquis which she is taking, asthma, migraines We could not get ABIs in this clinic because the amount of edema in his lower legs. Patient History Information obtained  from Patient. Allergies penicillin (Reaction: hives), amoxicillin (Reaction: hives), cefdinir (Reaction: hives) Family History Diabetes - Father, Hypertension - Father, No family history of Cancer, Heart Disease, Hereditary Spherocytosis, Kidney Disease, Lung Disease, Seizures, Stroke, Thyroid Problems, Tuberculosis. Social History Former smoker - quit 5 months ago/vaped - ended on 03/31/2020, Marital Status - Single, Alcohol Use - Never, Drug Use - No History, Caffeine Use - Daily. Medical History Ear/Nose/Mouth/Throat Patient has history of Chronic sinus problems/congestion - seasonal Cardiovascular Patient has history of Congestive Heart Failure, Deep Vein Thrombosis - (L) leg Gastrointestinal Patient  has history of Crohnoos Musculoskeletal Patient has history of Rheumatoid Arthritis - appt [id number] with rheumatologist Denies history of Gout, Osteoarthritis, Osteomyelitis Neurologic Denies history of Dementia, Neuropathy, Quadriplegia, Paraplegia, Seizure Disorder Hospitalization/Surgery History - heart failure june 2022. - multi emboli march 2022. Medical A Surgical History Notes nd Constitutional Symptoms (General Health) ADHD Respiratory PE , pericardial effusion Cardiovascular heart failure with reduced ejection fraction Gastrointestinal perforated appendix Review of Systems (ROS) Eyes Denies complaints or symptoms of Dry Eyes, Vision Changes, Glasses / Contacts. Ear/Nose/Mouth/Throat Denies complaints or symptoms of Chronic sinus problems or rhinitis. Endocrine Denies complaints or symptoms of Heat/cold intolerance. Genitourinary Denies complaints or symptoms of Frequent urination. Integumentary (Skin) Denies complaints or symptoms of Wounds. Musculoskeletal Complains or has symptoms of Muscle Weakness. Denies complaints or symptoms of Muscle Pain. Neurologic migraines without aura Psychiatric Denies complaints or symptoms of Claustrophobia,  Suicidal. Objective Constitutional Patient is hypotensive. However is not hypotensive.. Patient is tachycardic. Respirations regular, non-labored and within target range.. Temperature is normal and within the target range for the patient.. Awake and conversational. He is not sure why he is here. Vitals Time Taken: 1:53 PM, Height: 76 in, Source: Stated, Weight: 190 lbs, Source: Stated, BMI: 23.1, Temperature: 98.3 F, Pulse: 116 bpm, Respiratory Rate: 18 breaths/min, Blood Pressure: 70/41 mmHg. Respiratory work of breathing is normal. Bilateral breath sounds are clear and equal in all lobes with no wheezes, rales or rhonchi.. Cardiovascular Heart rhythm and rate regular, without murmur or gallop. JVP is not elevated. Scant sacral edema. Symmetric pitting edema well up into his thighs. Psychiatric appears at normal baseline. General Notes: Wound exam; there is no open wound Integumentary (Hair, Skin) Patient has no systemic rashes.. Assessment Active Problems ICD-10 Chronic systolic (congestive) heart failure Personal history of pulmonary embolism Plan Discharge From [facility]: Discharge from Wound Care Center . He did not1. I was not really sure why this patient was sent here or how he got here. 2. He has pitting edema in his lower legs no doubt related to either his recent DVT congestive heart failure and severe hypoalbuminemia. s, 3. He has an appointment with cardiology on 7/1 and think that would probably be more useful for follow-up and here 4. He is also working up with rheumatology with regards to a positive ANA and SSA. 5. He does not need to follow here. I was concerned about his low blood pressure and tachycardia although I only discovered this well after the patient had left the clinic look unwell. He did not look unwell Electronic Signature(s) Signed: 09/25/2020 5:29:17 PM By: Baltazar Najjar MD Entered By: Baltazar Najjar on 09/25/2020  14:59:34 -------------------------------------------------------------------------------- HxROS Details Patient Name: Date of Service: [facility], [name] 09/25/2020 1:15 PM Medical Record Number: [id number] Patient Account Number: [id number] Date of Birth/Sex: Treating RN: 20-Mar-2001 (20 y.o. Male) Shawn Stall Primary Care Provider: Anna Genre Other Clinician: Referring Provider: Treating Provider/Extender: Nuala Alpha in Treatment: 0 Information Obtained From Patient Eyes Complaints and Symptoms: Negative for: Dry Eyes; Vision Changes; Glasses / Contacts Ear/Nose/Mouth/Throat Complaints and Symptoms: Negative for: Chronic sinus problems or rhinitis Medical History: Positive for: Chronic sinus problems/congestion - seasonal Endocrine Complaints and Symptoms: Negative for: Heat/cold intolerance Genitourinary Complaints and Symptoms: Negative for: Frequent urination Integumentary (Skin) Complaints and Symptoms: Negative for: Wounds Musculoskeletal Complaints and Symptoms: Positive for: Muscle Weakness Negative for: Muscle Pain Medical History: Positive for: Rheumatoid Arthritis - appt [id number] with rheumatologist Negative for: Gout; Osteoarthritis; Osteomyelitis Psychiatric Complaints and Symptoms:  Negative for: Claustrophobia; Suicidal Constitutional Symptoms (General Health) Medical History: Past Medical History Notes: ADHD Hematologic/Lymphatic Respiratory Medical History: Past Medical History Notes: PE , pericardial effusion Cardiovascular Medical History: Positive for: Congestive Heart Failure; Deep Vein Thrombosis - (L) leg Past Medical History Notes: heart failure with reduced ejection fraction Gastrointestinal Medical History: Positive for: Crohns Past Medical History Notes: perforated appendix Immunological Neurologic Complaints and Symptoms: Review of System Notes: migraines without aura Medical  History: Negative for: Dementia; Neuropathy; Quadriplegia; Paraplegia; Seizure Disorder Oncologic HBO Extended History Items Ear/Nose/Mouth/Throat: Chronic sinus problems/congestion Immunizations Pneumococcal Vaccine: Received Pneumococcal Vaccination: No Immunization Notes: not time for pneumonia shot - unknown date of Tetanus shots. Implantable Devices None Hospitalization / Surgery History Type of Hospitalization/Surgery heart failure june 2022 multi emboli march 2022 Family and Social History Cancer: No; Diabetes: Yes - Father; Heart Disease: No; Hereditary Spherocytosis: No; Hypertension: Yes - Father; Kidney Disease: No; Lung Disease: No; Seizures: No; Stroke: No; Thyroid Problems: No; Tuberculosis: No; Former smoker - quit 5 months ago/vaped - ended on 03/31/2020; Marital Status - Single; Alcohol Use: Never; Drug Use: No History; Caffeine Use: Daily; Financial Concerns: No; Food, Clothing or Shelter Needs: No; Support System Lacking: No; Transportation Concerns: No Electronic Signature(s) Signed: 10/16/2020 10:42:52 AM By: Shawn Stall Signed: 10/16/2020 10:49:10 AM By: Baltazar Najjar MD Previous Signature: 09/25/2020 5:29:17 PM Version By: Baltazar Najjar MD Previous Signature: 10/15/2020 8:57:00 PM Version By: Shawn Stall Entered By: Shawn Stall on 10/16/2020 10:42:51 -------------------------------------------------------------------------------- SuperBill Details Patient Name: Date of Service: [facility], [name] 09/25/2020 Medical Record Number: [id number] Patient Account Number: [id number] Date of Birth/Sex: Treating RN: 2000/09/20 (20 y.o. Male) Fonnie Mu Primary Care Provider: Anna Genre Other Clinician: Referring Provider: Treating Provider/Extender: Nuala Alpha in Treatment: 0 Diagnosis Coding ICD-10 Codes Code Description I50.22 Chronic systolic (congestive) heart failure Z86.711 Personal history of pulmonary  embolism Facility Procedures CPT4 Code: [id number] Description: 99213 - WOUND CARE VISIT-LEV 3 EST PT Modifier: Quantity: 1 Physician Procedures : CPT4 Code Description Modifier [id number] - WC PHYS LEVEL 2 - NEW PT ICD-10 Diagnosis Description I50.22 Chronic systolic (congestive) heart failure Z86.711 Personal history of pulmonary embolism Quantity: 1 Electronic Signature(s) Signed: 09/25/2020 5:29:17 PM By: Baltazar Najjar MD Entered By: Baltazar Najjar on 09/25/2020 15:00:06

## 2020-10-16 NOTE — Progress Notes (Signed)
Canela, Jsaon A. ([id number]) Visit Report for 09/25/2020 Abuse/Suicide Risk Screen Details Patient Name: Date of Service: [facility], [place] V ID A. 09/25/2020 1:15 PM Medical Record Number: [id number] Patient Account Number: [id number] Date of Birth/Sex: Treating RN: 08-08-2000 (20 y.o. Male) Shawn Stall Primary Care Cristel Rail: Anna Genre Other Clinician: Referring Anisa Leanos: Treating Amora Sheehy/Extender: Nuala Alpha in Treatment: 0 Abuse/Suicide Risk Screen Items Answer ABUSE RISK SCREEN: Has anyone close to you tried to hurt or harm you recentlyo No Do you feel uncomfortable with anyone in your familyo No Has anyone forced you do things that you didnt want to doo No Electronic Signature(s) Signed: 10/16/2020 10:43:04 AM By: Shawn Stall Previous Signature: 10/15/2020 8:57:00 PM Version By: Shawn Stall Entered By: Shawn Stall on 10/16/2020 10:43:03 -------------------------------------------------------------------------------- Activities of Daily Living Details Patient Name: Date of ServiceShelda Jakes [facility], [name] 09/25/2020 1:15 PM Medical Record Number: [id number] Patient Account Number: [id number] Date of Birth/Sex: Treating RN: 10/13/2000 (20 y.o. Male) Shawn Stall Primary Care Ismeal Heider: Anna Genre Other Clinician: Referring Amoreena Neubert: Treating Dalia Jollie/Extender: Nuala Alpha in Treatment: 0 Activities of Daily Living Items Answer Activities of Daily Living (Please select one for each item) Drive Automobile Completely Able T Medications ake Completely Able Use T elephone Completely Able Care for Appearance Completely Able Use T oilet Completely Able Bath / Shower Completely Able Dress Self Completely Able Feed Self Completely Able Walk Completely Able Get In / Out Bed Completely Able Housework Completely Able Prepare Meals Completely Able Handle Money Completely Able Shop for Self Completely  Able Electronic Signature(s) Signed: 10/16/2020 10:43:13 AM By: Shawn Stall Previous Signature: 10/15/2020 8:57:00 PM Version By: Shawn Stall Entered By: Shawn Stall on 10/16/2020 10:43:13 -------------------------------------------------------------------------------- Education Screening Details Patient Name: Date of Service: [facility], [name] 09/25/2020 1:15 PM Medical Record Number: [id number] Patient Account Number: [id number] Date of Birth/Sex: Treating RN: January 22, 2001 (20 y.o. Male) Shawn Stall Primary Care Abbigail Anstey: Anna Genre Other Clinician: Referring Recie Cirrincione: Treating Vallarie Fei/Extender: Nuala Alpha in Treatment: 0 Primary Learner Assessed: Patient Learning Preferences/Education Level/Primary Language Learning Preference: Explanation, Printed Material Highest Education Level: High School Preferred Language: English Cognitive Barrier Language Barrier: No Translator Needed: No Memory Deficit: No Emotional Barrier: No Cultural/Religious Beliefs Affecting Medical Care: No Physical Barrier Impaired Vision: No Impaired Hearing: No Decreased Hand dexterity: No Knowledge/Comprehension Knowledge Level: Medium Comprehension Level: Medium Ability to understand written instructions: Medium Ability to understand verbal instructions: Medium Motivation Anxiety Level: Calm Cooperation: Cooperative Education Importance: Acknowledges Need Interest in Health Problems: Asks Questions Perception: Coherent Willingness to Engage in Self-Management High Activities: Readiness to Engage in Self-Management High Activities: Electronic Signature(s) Signed: 10/16/2020 10:43:37 AM By: Shawn Stall Previous Signature: 10/15/2020 8:57:00 PM Version By: Shawn Stall Entered By: Shawn Stall on 10/16/2020 10:43:36 -------------------------------------------------------------------------------- Fall Risk Assessment Details Patient Name: Date of  Service: [facility], [name] 09/25/2020 1:15 PM Medical Record Number: [id number] Patient Account Number: [id number] Date of Birth/Sex: Treating RN: 06-24-2000 (20 y.o. Male) Shawn Stall Primary Care Sherod Cisse: Anna Genre Other Clinician: Referring Magali Bray: Treating Jeno Calleros/Extender: Nuala Alpha in Treatment: 0 Fall Risk Assessment Items Have you had 2 or more falls in the last 12 monthso 0 No Have you had any fall that resulted in injury in the last 12 monthso 0 No FALLS RISK SCREEN History of falling - immediate or within 3 months 0 No Secondary diagnosis (Do you have 2 or more medical diagnoseso) 0 No Ambulatory  aid None/bed rest/wheelchair/nurse 0 No Crutches/cane/walker 0 No Furniture 0 No Intravenous therapy Access/Saline/Heparin Lock 0 No Gait/Transferring Normal/ bed rest/ wheelchair 0 No Weak (short steps with or without shuffle, stooped but able to lift head while walking, may seek 0 No support from furniture) Impaired (short steps with shuffle, may have difficulty arising from chair, head down, impaired 0 No balance) Mental Status Oriented to own ability 0 No Electronic Signature(s) Signed: 10/16/2020 10:43:57 AM By: Shawn Stall Previous Signature: 10/15/2020 8:57:00 PM Version By: Shawn Stall Entered By: Shawn Stall on 10/16/2020 10:43:57 -------------------------------------------------------------------------------- Foot Assessment Details Patient Name: Date of Service: [facility], [name] 09/25/2020 1:15 PM Medical Record Number: [id number] Patient Account Number: [id number] Date of Birth/Sex: Treating RN: 09-01-2000 (20 y.o. Male) Shawn Stall Primary Care Amit Meloy: Anna Genre Other Clinician: Referring Kobee Medlen: Treating Vedha Tercero/Extender: Nuala Alpha in Treatment: 0 Foot Assessment Items Site Locations + = Sensation present, - = Sensation absent, C = Callus, U = Ulcer R =  Redness, W = Warmth, M = Maceration, PU = Pre-ulcerative lesion F = Fissure, S = Swelling, D = Dryness Assessment Right: Left: Other Deformity: No No Prior Foot Ulcer: No No Prior Amputation: No No Charcot Joint: No No Ambulatory Status: Ambulatory Without Help Gait: Steady Electronic Signature(s) Signed: 10/16/2020 10:44:42 AM By: Shawn Stall Previous Signature: 10/15/2020 8:57:00 PM Version By: Shawn Stall Entered By: Shawn Stall on 10/16/2020 10:44:41 -------------------------------------------------------------------------------- Nutrition Risk Screening Details Patient Name: Date of Service: [facility], [name] 09/25/2020 1:15 PM Medical Record Number: [id number] Patient Account Number: [id number] Date of Birth/Sex: Treating RN: [birth year] (20 y.o. Male) Shawn Stall Primary Care Keiera Strathman: Anna Genre Other Clinician: Referring Taysen Bushart: Treating Jeannemarie Sawaya/Extender: Nuala Alpha in Treatment: 0 Height (in): 76 Weight (lbs): 190 Body Mass Index (BMI): 23.1 Nutrition Risk Screening Items Score Screening NUTRITION RISK SCREEN: I have an illness or condition that made me change the kind and/or amount of food I eat 0 No I eat fewer than two meals per day 0 No I eat few fruits and vegetables, or milk products 0 No I have three or more drinks of beer, liquor or wine almost every day 0 No I have tooth or mouth problems that make it hard for me to eat 0 No I don't always have enough money to buy the food I need 0 No I eat alone most of the time 0 No I take three or more different prescribed or over-the-counter drugs a day 0 No Without wanting to, I have lost or gained 10 pounds in the last six months 0 No I am not always physically able to shop, cook and/or feed myself 0 No Nutrition Protocols Good Risk Protocol Moderate Risk Protocol High Risk Proctocol Risk Level: Good Risk Score: 0 Electronic Signature(s) Signed: 10/16/2020 10:44:22  AM By: Shawn Stall Previous Signature: 10/15/2020 8:57:00 PM Version By: Shawn Stall Entered By: Shawn Stall on 10/16/2020 10:44:21

## 2020-10-16 NOTE — Progress Notes (Signed)
Glenn [name], Glenn [name] AMarland Kitchen ([id number]) Visit Report for 09/25/2020 Allergy List Details Patient Name: Date of Service: [facility] [place], [place] V ID A. 09/25/2020 1:15 PM Medical Record Number: [id number] Patient Account Number: [id number] Date of Birth/Sex: Treating RN: Mar 18, 2001 (20 y.o. Male) Glenn [name] Primary Care Glenn [name]: Anna [name] Other Clinician: Referring Glenn [name] Glenn [name] in Treatment: 0 Allergies Active Allergies penicillin Reaction: hives amoxicillin Reaction: hives cefdinir Reaction: hives Allergy Notes Electronic Signature(s) Signed: 10/16/2020 10:42:21 AM By: Glenn [name] Previous Signature: 10/15/2020 8:57:00 PM Version By: Glenn [name] Entered By: Glenn [name] on 10/16/2020 10:42:20 -------------------------------------------------------------------------------- Arrival Information Details Patient Name: Date of Service: [facility], [name] 09/25/2020 1:15 PM Medical Record Number: [id number] Patient Account Number: [id number] Date of Birth/Sex: Treating RN: June 25, 2000 (20 y.o. Male) Glenn [name] Primary Care Glenn [name] Other Clinician: Referring Glenn [name]: [name] Glenn [name]/Extender: Glenn [name] in Treatment: 0 Visit Information Patient Arrived: Ambulatory Arrival Time: 13:53 Accompanied By: self Transfer Assistance: None Patient Identification Verified: Yes Secondary Verification Process Completed: Yes Electronic Signature(s) Signed: 10/16/2020 10:41:22 AM By: Glenn [name] Previous Signature: 10/15/2020 8:57:00 PM Version By: Glenn [name] Entered By: Glenn [name] on 10/16/2020 10:41:22 -------------------------------------------------------------------------------- Clinic Level of Care Assessment Details Patient Name: Date of ServiceShelda [name] [facility], [name] 09/25/2020 1:15 PM Medical Record Number: [id number] Patient Account Number: [id number] Date of Birth/Sex:  Treating RN: Jun 01, 2000 (20 y.o. Male) Glenn [name] Primary Care Glenn [name]: Anna [name] Other Clinician: Referring Glenn [name]: [name] Glenn [name]/Extender: Glenn [name] in Treatment: 0 Clinic Level of Care Assessment Items TOOL 2 Quantity Score X- 1 0 Use when only an EandM is performed on the INITIAL visit ASSESSMENTS - Nursing Assessment / Reassessment X- 1 20 General Physical Exam (combine w/ comprehensive assessment (listed just below) when performed on new pt. evals) X- 1 25 Comprehensive Assessment (HX, ROS, Risk Assessments, Wounds Hx, etc.) ASSESSMENTS - Wound and Skin A ssessment / Reassessment X - Simple Wound Assessment / Reassessment - one wound 1 5 []  - 0 Complex Wound Assessment / Reassessment - multiple wounds []  - 0 Dermatologic / Skin Assessment (not related to wound area) ASSESSMENTS - Ostomy and/or Continence Assessment and Care []  - 0 Incontinence Assessment and Management []  - 0 Ostomy Care Assessment and Management (repouching, etc.) PROCESS - Coordination of Care X - Simple Patient / Family Education for ongoing care 1 15 []  - 0 Complex (extensive) Patient / Family Education for ongoing care X- 1 10 Staff obtains Chiropractor, Records, T Results / Process Orders est []  - 0 Staff telephones HHA, Nursing Homes / Clarify orders / etc []  - 0 Routine Transfer to another Facility (non-emergent condition) []  - 0 Routine Hospital Admission (non-emergent condition) []  - 0 New Admissions / Manufacturing engineer / Ordering NPWT Apligraf, etc. , []  - 0 Emergency Hospital Admission (emergent condition) X- 1 10 Simple Discharge Coordination []  - 0 Complex (extensive) Discharge Coordination PROCESS - Special Needs []  - 0 Pediatric / Minor Patient Management []  - 0 Isolation Patient Management []  - 0 Hearing / Language / Visual special needs []  - 0 Assessment of Community assistance (transportation, D/C planning,  etc.) []  - 0 Additional assistance / Altered mentation []  - 0 Support Surface(s) Assessment (bed, cushion, seat, etc.) INTERVENTIONS - Wound Cleansing / Measurement []  - 0 Wound Imaging (photographs - any number of wounds) []  - 0 Wound Tracing (instead of photographs) []  - 0 Simple Wound Measurement - one wound []  - 0 Complex  Wound Measurement - multiple wounds []  - 0 Simple Wound Cleansing - one wound []  - 0 Complex Wound Cleansing - multiple wounds INTERVENTIONS - Wound Dressings []  - 0 Small Wound Dressing one or multiple wounds []  - 0 Medium Wound Dressing one or multiple wounds []  - 0 Large Wound Dressing one or multiple wounds []  - 0 Application of Medications - injection INTERVENTIONS - Miscellaneous []  - 0 External ear exam []  - 0 Specimen Collection (cultures, biopsies, blood, body fluids, etc.) []  - 0 Specimen(s) / Culture(s) sent or taken to Lab for analysis []  - 0 Patient Transfer (multiple staff / Michiel Sites Lift / Similar devices) []  - 0 Simple Staple / Suture removal (25 or less) []  - 0 Complex Staple / Suture removal (26 or more) []  - 0 Hypo / Hyperglycemic Management (close monitor of Blood Glucose) X- 1 15 Ankle / Brachial Index (ABI) - do not check if billed separately Has the patient been seen at the hospital within the last three years: Yes Total Score: 100 Level Of Care: New/Established - Level 3 Electronic Signature(s) Signed: 09/25/2020 5:48:53 PM By: Glenn Mu RN Entered By: Glenn [name] on 09/25/2020 14:37:24 -------------------------------------------------------------------------------- Encounter Discharge Information Details Patient Name: Date of Service: [name] [name] 09/25/2020 1:15 PM Medical Record Number: [id number] Patient Account Number: [id number] Date of Birth/Sex: Treating RN: September 19, 2000 (20 y.o. Male) Glenn [name] Primary Care Audyn Dimercurio: Anna [name] Other Clinician: Referring Enijah Furr: [name]  Glenn [name]/Extender: Glenn [name] in Treatment: 0 Encounter Discharge Information Items Discharge Condition: Stable Ambulatory Status: Ambulatory Discharge Destination: Home Transportation: Private Auto Accompanied By: alone Schedule Follow-up Appointment: No Clinical Summary of Care: Patient Declined Electronic Signature(s) Signed: 10/16/2020 10:46:00 AM By: Glenn [name] Previous Signature: 10/15/2020 8:57:00 PM Version By: Glenn [name] Entered By: Glenn [name] on 10/16/2020 10:46:00 -------------------------------------------------------------------------------- Lower Extremity Assessment Details Patient Name: Date of Service: [facility], [name] 09/25/2020 1:15 PM Medical Record Number: [id number] Patient Account Number: [id number] Date of Birth/Sex: Treating RN: [birth year] (20 y.o. Male) Glenn [name] Primary Care Ruthia Person: Other Clinician: Anna [name] Referring Abid Bolla: [name] Segundo Makela/Extender: Glenn [name] in Treatment: 0 Edema Assessment Assessed: Kyra Searles: No] [Right: No] E[Left: dema] [Right: :] Calf Left: Right: Point of Measurement: 30 cm From Medial Instep 38.5 cm 40.6 cm Ankle Left: Right: Point of Measurement: 10 cm From Medial Instep 28.5 cm 28 cm Knee To Floor Left: Right: From Medial Instep 53 cm 53 cm Vascular Assessment Pulses: Dorsalis Pedis Palpable: [Left:Yes] [Right:Yes] Electronic Signature(s) Signed: 10/16/2020 10:44:52 AM By: Glenn [name] Previous Signature: 10/15/2020 8:57:00 PM Version By: Glenn [name] Entered By: Glenn [name] on 10/16/2020 10:44:52 -------------------------------------------------------------------------------- Multi Wound Chart Details Patient Name: Date of Service: [facility], [name] 09/25/2020 1:15 PM Medical Record Number: [id number] Patient Account Number: [id number] Date of Birth/Sex: Treating RN: Sep 13, 2000 (20 y.o. Male) Glenn [name] Primary  Care Wilbur Oakland: Anna [name] Other Clinician: Referring Kwame Ryland: [name] Gianella Chismar/Extender: Glenn [name] in Treatment: 0 Vital Signs Height(in): 76 Pulse(bpm): 116 Weight(lbs): 190 Blood Pressure(mmHg): 70/41 Body Mass Index(BMI): 23 Temperature(F): 98.3 Respiratory Rate(breaths/min): 18 Wound Assessments Treatment Notes Electronic Signature(s) Signed: 09/25/2020 5:29:17 PM By: Baltazar Najjar MD Signed: 09/25/2020 5:48:53 PM By: Glenn Mu RN Entered By: Baltazar Najjar on 09/25/2020 14:46:24 -------------------------------------------------------------------------------- Multi-Disciplinary Care Plan Details Patient Name: Date of Service: [facility], [name] 09/25/2020 1:15 PM Medical Record Number: [id number] Patient Account Number: [id number] Date of Birth/Sex: Treating RN: 2000/06/14 (20 y.o. Male) Bryon Lions,  Lauren Primary Care Joedy Eickhoff: Anna [name] Other Clinician: Referring Marelyn Rouser: [name] Kemani Demarais/Extender: Glenn [name] in Treatment: 0 Active Inactive Electronic Signature(s) Signed: 09/25/2020 5:48:53 PM By: Glenn Mu RN Entered By: Glenn [name] on 09/25/2020 14:36:45 -------------------------------------------------------------------------------- Pain Assessment Details Patient Name: Date of Service: [facility], [name] 09/25/2020 1:15 PM Medical Record Number: [id number] Patient Account Number: [id number] Date of Birth/Sex: Treating RN: 2001/03/24 (20 y.o. Male) Glenn [name] Primary Care Jeanice Dempsey: Anna [name] Other Clinician: Referring Jaxie Racanelli: [name] Sohail Capraro/Extender: Glenn [name] in Treatment: 0 Active Problems Location of Pain Severity and Description of Pain Patient Has Paino No Site Locations Rate the pain. Current Pain Level: 0 Pain Management and Medication Current Pain Management: Electronic Signature(s) Signed:  10/16/2020 10:45:14 AM By: Glenn [name] Previous Signature: 10/15/2020 8:57:00 PM Version By: Glenn [name] Entered By: Glenn [name] on 10/16/2020 10:45:13 -------------------------------------------------------------------------------- Patient/Caregiver Education Details Patient Name: Date of Service: [facility], [name] 6/28/2022andnbsp1:15 PM Medical Record Number: [id number] Patient Account Number: [id number] Date of Birth/Gender: Treating RN: 2001/02/07 (20 y.o. Male) Glenn [name] Primary Care Physician: Anna [name] Other Clinician: Referring Physician: Treating Physician/Extender: Glenn [name] in Treatment: 0 Education Assessment Education Provided To: Patient Education Topics Provided Wound/Skin Impairment: Methods: Explain/Verbal Responses: State content correctly Electronic Signature(s) Signed: 10/16/2020 10:46:14 AM By: Glenn [name] Entered By: Glenn [name] on 10/16/2020 10:46:14 -------------------------------------------------------------------------------- Vitals Details Patient Name: Date of Service: [facility], [name] 09/25/2020 1:15 PM Medical Record Number: [id number] Patient Account Number: [id number] Date of Birth/Sex: Treating RN: 2000/12/18 (20 y.o. Male) Glenn [name] Primary Care Persais Ethridge: Anna [name] Other Clinician: Referring Hitoshi Werts: [name] Dierra Riesgo/Extender: Glenn [name] in Treatment: 0 Vital Signs Time Taken: 13:53 Temperature (F): 98.3 Height (in): 76 Pulse (bpm): 116 Source: Stated Respiratory Rate (breaths/min): 18 Weight (lbs): 190 Blood Pressure (mmHg): 70/41 Source: Stated Reference Range: 80 - 120 mg / dl Body Mass Index (BMI): 23.1 Electronic Signature(s) Signed: 10/16/2020 10:41:36 AM By: Glenn [name] Previous Signature: 09/25/2020 4:39:21 PM Version By: Karl Ito Previous Signature: 10/15/2020 8:57:00 PM Version By: Glenn [name] Entered By:  Glenn [name] on 10/16/2020 10:41:36

## 2020-10-17 ENCOUNTER — Telehealth (HOSPITAL_COMMUNITY): Payer: Self-pay | Admitting: Licensed Clinical Social Worker

## 2020-10-17 NOTE — Telephone Encounter (Signed)
CSW received message to assist patient with insurance questions. CSW contacted patient via phone and left message. Lasandra Beech, LCSW, CCSW-MCS [phone]

## 2020-10-17 NOTE — Progress Notes (Signed)
Office Visit Note  Patient: Glenn [name]             Date of Birth: 27-Jan-2001           MRN: [id number]             PCP: Ihor Gully, MD Referring: Laurey Morale, MD Visit Date: 10/18/2020 Occupation: Adriana Simas  Subjective:  New Patient (Initial Visit) (Patient complains of bilateral elbow and bilateral knee pain and stiffness. )   History of Present Illness: Glenn [name] [name] is a 20 y.o. male here for evaluation of positive ANA in the setting of multiple systemic chronic dysfunctions.  He has crohn's disease diagnosed about 2 years ago on treatment with mesalamine according to reviewed records apparently had some increased symptoms at least 6 months ago but apparently well controlled based on most recent evaluation.  Earlier this year he presented to the hospital with severe symptoms with extensive acute bilateral pulmonary emboli with right heart strain and also pericardial effusion.  At that time was also found to have biventricular dysfunction and diffuse hypokinesis.  He was recently hospitalized with severe edema. Evaluation of heart function during that time showed improvement compared to before and not concern for heart failure exacerbation.  He was in compressive wrap in a boot for a while after this discharge due to the extent of edema and localized symptoms and inflammation.  He has been experiencing ongoing joint pains in his elbows and knees also describes neck and back pain symptoms.  He notices these intermittently such as getting up out of a chair.  He denies nighttime awakening or prolonged morning stiffness.  He has not noticed any particular swelling around the elbows or knees. Laboratory work-up at his hospitalization for PE was negative for antiphospholipid antibodies.  More recent work-up also showed positive ANA with a moderate SSA antibody titer otherwise negative ENA's.  He denies issues of alopecia, oral ulcers, skin rashes, Raynaud's symptoms.  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   Labs reviewed 09/2020 TP 3.4 Albumin <1.0 BMP Calcium 6.8  07/2020 ANA pos SSA 4.1 dsDNA, RNP, Smith, Scl-70, SSB, Chromatin, Jo-1, centromere neg CBC Hgb 9.2 UA wnl  05/2020 B2GP1 IgA, IgM, IgG neg ACA IgA, IgM, IgG neg LA neg  Activities of Daily Living:  Patient reports morning stiffness for 5-10 minutes.   Patient Denies nocturnal pain.  Difficulty dressing/grooming: Reports Difficulty climbing stairs: Reports Difficulty getting out of chair: Reports Difficulty using hands for taps, buttons, cutlery, and/or writing: Reports  Review of Systems  Constitutional:  Positive for fatigue.  HENT:  Negative for mouth sores, mouth dryness and nose dryness.   Eyes:  Positive for visual disturbance. Negative for pain, itching and dryness.  Respiratory:  Positive for shortness of breath and difficulty breathing. Negative for cough and hemoptysis.   Cardiovascular:  Positive for swelling in legs/feet. Negative for chest pain and palpitations.  Gastrointestinal:  Positive for constipation and diarrhea. Negative for abdominal pain and blood in stool.  Endocrine: Negative for increased urination.  Genitourinary:  Negative for painful urination.  Musculoskeletal:  Positive for joint pain, joint pain, muscle weakness and morning stiffness. Negative for joint swelling, myalgias, muscle tenderness and myalgias.  Skin:  Negative for color change, rash and redness.  Allergic/Immunologic: Negative for susceptible to infections.  Neurological:  Positive for weakness. Negative for dizziness, numbness, headaches and memory loss.  Hematological:  Negative  for swollen glands.  Psychiatric/Behavioral:  Negative for confusion and sleep disturbance.    PMFS History:  Patient Active Problem List   Diagnosis Date Noted   Polyarthralgia 10/18/2020   Neck pain 10/18/2020   Low back pain 10/18/2020    Positive ANA (antinuclear antibody) 10/18/2020   Heart failure with reduced ejection fraction (HCC) 08/26/2020   Acute systolic CHF (congestive heart failure) (HCC)    Pulmonary emboli (HCC) 05/23/2020   Crohn's disease (HCC) 05/23/2020   Pericardial effusion 05/23/2020   Deep vein thrombosis (DVT) of left lower extremity (HCC) 05/23/2020   Abdominal pain 10/15/2017   Perforated appendicitis 2017-10-10   Death of parent 07-19-17   Migraine without aura and with status migrainosus, not intractable 10/19/2012   ADHD (attention deficit hyperactivity disorder) 10/19/2012   Circadian rhythm sleep disorder, 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   Moderate intermittent asthma 07/25/2011   SOB (shortness of breath) 07/25/2011   Egg allergy 12/31/2010   Eczema 11/19/2010   Allergic rhinitis 04/12/2010   Migraine headache 04/12/2010    Past Medical History:  Diagnosis Date   Asthma    Crohn's disease (HCC) 05/23/2020   Diagnosed ~2019   Deep vein thrombosis (DVT) of left lower extremity (HCC) 05/23/2020   L femoral, L popliteal veins 05/23/2020   Headache(784.0)    Pericardial effusion 05/23/2020   Pulmonary emboli (HCC) 05/23/2020   Bilateral with e/o R heart strain on CTA   Sickle cell trait (HCC) 01/02/2000    Family History  Problem Relation Age of Onset   Autoimmune disease Mother    Hepatitis Mother    Arthritis Father    Diabetes Father    Seizures Cousin        Epilepsy   Past Surgical History:  Procedure Laterality Date   CIRCUMCISION     Social History   Social History Narrative   Not on file   Immunization History  Administered Date(s) Administered   Influenza,inj,Quad PF,6+ Mos 05/29/2020     Objective: Vital Signs: BP (!) 84/54 (BP Location: Right Arm, Patient Position: Sitting, Cuff Size: Normal)   Pulse 96   Ht 6' 1.75" (1.873 m)   Wt 175 lb 3.2 oz (79.5 kg)   BMI 22.65 kg/m    Physical Exam HENT:     Right Ear: External ear normal.     Left Ear:  External ear normal.     Mouth/Throat:     Mouth: Mucous membranes are moist.     Pharynx: Oropharynx is clear.  Eyes:     Conjunctiva/sclera: Conjunctivae normal.  Cardiovascular:     Rate and Rhythm: Normal rate and regular rhythm.  Pulmonary:     Effort: Pulmonary effort is normal.     Breath sounds: Normal breath sounds.  Skin:    General: Skin is warm and dry.     Comments: 2+ pitting pedal edema bilaterally, no petechiae or stasis dermatitis changes  Neurological:     General: No focal deficit present.     Mental Status: He is alert.  Psychiatric:        Mood and Affect: Mood normal.     Musculoskeletal Exam:  Neck full ROM tenderness to palpation over lateral paraspinal muscle Shoulders full ROM no tenderness or swelling Elbows full ROM no tenderness or swelling Wrists full ROM no tenderness or swelling Fingers full ROM no tenderness or swelling No paraspinal tenderness to palpation over upper and lower back Knees full ROM no tenderness or swelling Ankles full ROM  no tenderness or swelling   Investigation: No additional findings.  Imaging: XR Lumbar Spine 2-3 Views  Result Date: 10/18/2020 X-ray lumbar spine 2 views No acute bony abnormalities are seen.  Visualized portion of the spine demonstrates very slight dextroscoliosis.  Vertebral height and disc spaces appear well-preserved.  No significant enthesophytes or osteophytes present. Impression No significant lumbar spine arthritis  XR Pelvis 1-2 Views  Result Date: 10/18/2020 X-ray pelvis 2 views AP and Ferguson Bilateral femoral acetabular joint spaces appear well-preserved.  SI joints appear patent bilaterally.  Questionable area of sclerosis but bilateral asymmetry not clear with slight positional rotation.  No obvious erosive changes are seen.  No other abnormal bone mineralization. Impression No radiographic sacroiliitis seen   Recent Labs: Lab Results  Component Value Date   WBC 5.1 08/28/2020   HGB 9.2  (L) 08/28/2020   PLT 309 08/28/2020   NA 136 10/08/2020   K 3.6 10/08/2020   CL 104 10/08/2020   CO2 29 10/08/2020   GLUCOSE 87 10/08/2020   BUN 10 10/08/2020   CREATININE 0.99 10/08/2020   BILITOT 0.3 09/28/2020   ALKPHOS 97 09/28/2020   AST 26 09/28/2020   ALT 38 09/28/2020   PROT 3.4 (L) 09/28/2020   ALBUMIN <1.0 (L) 09/28/2020   CALCIUM 6.9 (L) 10/08/2020   GFRAA >60 11/05/2019    Speciality Comments: No specialty comments available.  Procedures:  No procedures performed Allergies: Penicillins, Amoxicillin, and Cefdinir   Assessment / Plan:     Visit Diagnoses: Positive ANA (antinuclear antibody)  Pericardial effusion Other acute pulmonary embolism with acute cor pulmonale (HCC) - Plan: Sjogren's syndrome antibods(ssa + ssb), Rheumatoid factor, IgG, IgA, IgM, ANA, C3 and C4  Positive ANA with history of pulmonary emboli and pericardial effusion though I do not see much systemic evidence for disease activity.  We will recheck antibody titer also checking rheumatoid factor complements and quantitative immunoglobulins.  Without more specific results or change in symptoms would not currently attribute his symptoms to a particular systemic connective tissue disease at this time.  Crohn's disease with complication, unspecified gastrointestinal tract location [facility])  Polyarthralgia  Chronic bilateral low back pain without sciatica - Plan: XR Pelvis 1-2 Views, XR Lumbar Spine 2-3 Views  History of Crohn's disease apparently is well controlled based on most recent evaluation though I am very concerned with his severe hypoalbuminemia.  He describes more oligoarticular peripheral joint involvement and more upper axial symptoms but will check baseline radiographs for SI and lumbar spine just given his history and his oligoarthritis.  Neck pain - Plan: cyclobenzaprine (FLEXERIL) 5 MG tablet  Neck pain is localized over paraspinal muscles and laterally suspect that this is more postural  or work-related.  Recommended trial of low-dose cyclobenzaprine as needed see if there is a significant improvement or not.  Discussed trying medicine first at night due to risks of drowsiness and CNS depression.  Orders: Orders Placed This Encounter  Procedures   XR Pelvis 1-2 Views   XR Lumbar Spine 2-3 Views   Sjogren's syndrome antibods(ssa + ssb)   Rheumatoid factor   IgG, IgA, IgM   ANA   C3 and C4    Meds ordered this encounter  Medications   cyclobenzaprine (FLEXERIL) 5 MG tablet    Sig: Take 1 tablet (5 mg total) by mouth daily as needed for muscle spasms.    Dispense:  30 tablet    Refill:  0    Follow-Up Instructions: No follow-ups on file.  Collier Salina, MD  Note - This record has been created using [employer].  Chart creation errors have been sought, but may not always  have been located. Such creation errors do not reflect on  the standard of medical care.

## 2020-10-18 ENCOUNTER — Ambulatory Visit (INDEPENDENT_AMBULATORY_CARE_PROVIDER_SITE_OTHER): Payer: BC Managed Care – PPO | Admitting: Internal Medicine

## 2020-10-18 ENCOUNTER — Other Ambulatory Visit: Payer: Self-pay

## 2020-10-18 ENCOUNTER — Ambulatory Visit: Payer: Self-pay

## 2020-10-18 ENCOUNTER — Encounter: Payer: Self-pay | Admitting: Internal Medicine

## 2020-10-18 VITALS — BP 84/54 | HR 96 | Ht 73.75 in | Wt 175.2 lb

## 2020-10-18 DIAGNOSIS — G8929 Other chronic pain: Secondary | ICD-10-CM

## 2020-10-18 DIAGNOSIS — M542 Cervicalgia: Secondary | ICD-10-CM | POA: Insufficient documentation

## 2020-10-18 DIAGNOSIS — I2609 Other pulmonary embolism with acute cor pulmonale: Secondary | ICD-10-CM

## 2020-10-18 DIAGNOSIS — R768 Other specified abnormal immunological findings in serum: Secondary | ICD-10-CM | POA: Diagnosis not present

## 2020-10-18 DIAGNOSIS — I3139 Other pericardial effusion (noninflammatory): Secondary | ICD-10-CM

## 2020-10-18 DIAGNOSIS — M545 Low back pain, unspecified: Secondary | ICD-10-CM | POA: Insufficient documentation

## 2020-10-18 DIAGNOSIS — M2550 Pain in unspecified joint: Secondary | ICD-10-CM | POA: Diagnosis not present

## 2020-10-18 DIAGNOSIS — K50919 Crohn's disease, unspecified, with unspecified complications: Secondary | ICD-10-CM | POA: Diagnosis not present

## 2020-10-18 DIAGNOSIS — M5459 Other low back pain: Secondary | ICD-10-CM

## 2020-10-18 DIAGNOSIS — I313 Pericardial effusion (noninflammatory): Secondary | ICD-10-CM

## 2020-10-18 MED ORDER — CYCLOBENZAPRINE HCL 5 MG PO TABS: 5.0000 mg | ORAL_TABLET | Freq: Every day | ORAL | 0 refills | Status: AC | PRN

## 2020-10-21 LAB — SJOGREN'S SYNDROME ANTIBODS(SSA + SSB)
SSA (Ro) (ENA) Antibody, IgG: 3.7 AI — AB
SSB (La) (ENA) Antibody, IgG: <1 AI

## 2020-10-21 LAB — C3 AND C4
C3 Complement: 68 mg/dL — ABNORMAL LOW (ref 82–185)
C4 Complement: 31 mg/dL (ref 15–53)

## 2020-10-21 LAB — ANA: Anti Nuclear Antibody (ANA): POSITIVE — AB

## 2020-10-21 LAB — IGG, IGA, IGM
IgG (Immunoglobin G), Serum: 1276 mg/dL (ref 600–1640)
IgM, Serum: 109 mg/dL (ref 50–300)
Immunoglobulin A: 358 mg/dL — ABNORMAL HIGH (ref 47–310)

## 2020-10-21 LAB — ANTI-NUCLEAR AB-TITER (ANA TITER): ANA Titer 1: 1:80 {titer} — ABNORMAL HIGH

## 2020-10-21 LAB — RHEUMATOID FACTOR: Rheumatoid fact SerPl-aCnc: <14 IU/mL (ref <14)

## 2020-10-25 ENCOUNTER — Other Ambulatory Visit: Payer: Self-pay

## 2020-10-25 ENCOUNTER — Ambulatory Visit: Payer: BC Managed Care – PPO | Attending: Physician Assistant | Admitting: Physician Assistant

## 2020-10-25 VITALS — BP 88/84 | HR 110 | Ht 74.0 in | Wt 171.0 lb

## 2020-10-25 DIAGNOSIS — I2609 Other pulmonary embolism with acute cor pulmonale: Secondary | ICD-10-CM | POA: Diagnosis not present

## 2020-10-25 DIAGNOSIS — R768 Other specified abnormal immunological findings in serum: Secondary | ICD-10-CM

## 2020-10-25 DIAGNOSIS — E8809 Other disorders of plasma-protein metabolism, not elsewhere classified: Secondary | ICD-10-CM

## 2020-10-25 DIAGNOSIS — Z09 Encounter for follow-up examination after completed treatment for conditions other than malignant neoplasm: Secondary | ICD-10-CM

## 2020-10-25 DIAGNOSIS — K50919 Crohn's disease, unspecified, with unspecified complications: Secondary | ICD-10-CM | POA: Diagnosis not present

## 2020-10-25 DIAGNOSIS — I5020 Unspecified systolic (congestive) heart failure: Secondary | ICD-10-CM | POA: Diagnosis not present

## 2020-10-25 DIAGNOSIS — I824Y2 Acute embolism and thrombosis of unspecified deep veins of left proximal lower extremity: Secondary | ICD-10-CM

## 2020-10-25 DIAGNOSIS — R600 Localized edema: Secondary | ICD-10-CM

## 2020-10-25 NOTE — Progress Notes (Signed)
Patient ID: Glenn [name], male   DOB: [birth year], 20 y.o.   MRN: [id number]    Glenn [name], is a 20 y.o. male  EHU:[id number]  ZCH:[id number]  DOB - 06-23-2000  Subjective:  Chief Complaint and HPI: Glenn [name] is a 20 y.o. male here today to establish care and for a follow up visit After hospitalization 5/29-6/02.  Saw rheumatology 10/18/2020.  Dr Doneta Public follows for Crohn's.  Has cardiology follow up 11/09/2020.  He is feeling good overall.  Weights have been stable and swelling has improved on lasix.  He says his BP runs low and he is asymptomatic.  He does not RF of anything currently.  He says he was diagnosed with Crohn's about 2019 but has been otherwise healthy until having severe Covid in 10/2019.     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   Presented to ED 05/23/20 w/ complaints of SOB, cough and dizziness and found to have extensive acute bilateral PE w/ evidence of RV strain as well as moderate sized pericardial effusion. LE venous dopplers + for Lt sided DVT. 2D echo showed BiV dysfunction. LVEF 30-35% w/ global hypokensis. RV moderately enlarged w/ moderately reduced systolic function. Moderate TR. No prior studies for comparison.  Cardiology initially consulted. Given hemodynamic stability, there was no immediate indication for TPA nor pericardiocentesis. He was placed on IV heparin.   AHF team was consulted given increasing volume overload and persistent tachycardia and hypotension. HR on admission was in the 140s but had improved down to the 110s. Had f/u limited echo and  pericardial effusion seems slightly smaller. cMRI showed no myocardial LGE, so no definitive evidence for prior MI, infiltrative disease, or myocarditis. Diffuse HK towards the base and less towards the apex. ? Reverse Takotsubo pattern. Pericardial effusion on MRI felt to be moderate but no tamponade. He was discharged on GDMT and Eliquis. Discharge weight 179 lbs.   Had post hospital f/u at [facility] on 3/9. Volume status ok w/ NYHA Class II symptoms. Losartan transitioned to [place]. Ivabradine increased to 7.5 bid. He was instructed to return back for further med titration w/ plans to repeat echo in 3 months but not seen since post hospital. Per pt report, he was unable to get additional refills of Entresto and stopped after he finished the first 30 day supply. He was still taking spiro and ivabraine.    He presented to the [facility] on 5/29 w/ bilateral leg swelling, progressively worsening over the last 2 weeks. Denies dyspnea. Reports full med compliance. In ED, CMP showed severe hypoalbuminemia <1.0. UA negative for proteinuria. LFTs normal. Bilateral LE venous dopplers negative for DVT. BNP normal at 53.1. CXR w/o acute cardiopulmonary disease. EKG NSR 96 bpm. Echo repeated and improved. LVEF now 50-55%. RV mildly enlarged w/ normal systolic function. Estimated RVSP 27.8 mmHg. Mild TR and small pericardial effusion.    The peripheral edema does not seem to be CHF-related.  His LV EF has improved to 50-55% (consistent with recovery of a Takotsubo-type cardiomyopathy).  Normal BNP. JVP not significantly elevated and IVC small on echo. LE Venous dopplers negative for DVT. I suspect  that his peripheral edema is related to his low oncotic pressure with albumin < 1.  He does not have proteinuria on UA, given his Crohns history, I am concerned for a protein-losing enteropathy versus malabsorption.  Albumin level has steadily fallen over time.   - Would continue cardiac regimen for now,  Entresto/ivabradine/spironolactone. - Unna boots on, would send home with unna boots. - Have asked GI to see given Crohns and concern for a protein-losing enteropathy versus malabsorption.  No new recommendations, followup with outpatient GI.  I am very concerned in this young patient with very low albumin, worry about Crohns complication, may end up needing TPN if malabsorbing.  Will make sure he has close GI followup as outpatient. - Think we can hold off on RHC.    Also ANA+ with SSA+.  He will be referred to rheumatology for further w/u.   On 6/2, he was last seen and examined by Dr. Shirlee Latch and felt stable for d/c home. Post hospital f/u arranged in [facility] + w/ GI. Rheumatology referral placed.   From rheumatology A/P: Assessment / Plan:     Visit Diagnoses: Positive ANA (antinuclear antibody)  Pericardial effusion Other acute pulmonary embolism with acute cor pulmonale (HCC) - Plan: Sjogren's syndrome antibods(ssa + ssb), Rheumatoid factor, IgG, IgA, IgM, ANA, C3 and C4   Positive ANA with history of pulmonary emboli and pericardial effusion though I do not see much systemic evidence for disease activity.  We will recheck antibody titer also checking rheumatoid factor complements and quantitative immunoglobulins.  Without more specific results or change in symptoms would not currently attribute his symptoms to a particular systemic connective tissue disease at this time.   Crohn's disease with complication, unspecified gastrointestinal tract location [facility]) Polyarthralgia Chronic bilateral low back pain without sciatica - Plan: XR Pelvis 1-2 Views, XR Lumbar Spine 2-3 Views   History of Crohn's disease apparently is well controlled based on most recent evaluation though I am very concerned with his severe hypoalbuminemia.  He describes more oligoarticular peripheral joint involvement and more upper axial symptoms but will check baseline radiographs for SI and lumbar spine just given his  history and his oligoarthritis.   Neck pain - Plan: cyclobenzaprine (FLEXERIL) 5 MG tablet   Neck pain is localized over paraspinal muscles and laterally suspect that this is more postural or work-related.  Recommended trial of low-dose cyclobenzaprine as needed see if there is a significant improvement or not.  Discussed trying medicine first at night due to risks of drowsiness and CNS depression.  ED/Hospital notes reviewed.    ROS:   Constitutional:  No f/c, No night sweats, No unexplained weight loss. EENT:  No vision changes, No blurry vision, No hearing changes. No mouth, throat, or ear problems.  Respiratory: No cough, No SOB Cardiac: No CP, no palpitations GI:  No abd pain, No N/V/D. GU: No Urinary s/sx Musculoskeletal: No joint pain Neuro: No headache, no dizziness, no motor weakness.  Skin: No rash Endocrine:  No polydipsia. No polyuria.  Psych: Denies SI/HI  No problems updated.  ALLERGIES: Allergies  Allergen Reactions   Penicillins Hives   Amoxicillin Hives   Cefdinir Hives    PAST MEDICAL HISTORY: Past Medical History:  Diagnosis Date   Asthma    Crohn's disease (HCC) 05/23/2020   Diagnosed ~2019   Deep vein thrombosis (DVT) of left lower extremity (HCC) 05/23/2020   L femoral, L popliteal veins 05/23/2020   Headache(784.0)    Pericardial effusion 05/23/2020  Pulmonary emboli (HCC) 05/23/2020   Bilateral with e/o R heart strain on CTA   Sickle cell trait (HCC) 18-Jun-1999    MEDICATIONS AT HOME: Prior to Admission medications   Medication Sig Start Date End Date Taking? Authorizing Provider  apixaban (ELIQUIS) 5 MG TABS tablet Take 1 tablet (5 mg total) by mouth 2 (two) times daily. 05/30/20  Yes Regalado, Belkys A, MD  cyclobenzaprine (FLEXERIL) 5 MG tablet Take 1 tablet (5 mg total) by mouth daily as needed for muscle spasms. 10/18/20  Yes Rice, Jamesetta Orleans, MD  ferrous sulfate 325 (65 FE) MG tablet Take 1 tablet (325 mg total) by mouth daily with  breakfast. 05/30/20  Yes Regalado, Belkys A, MD  furosemide (LASIX) 40 MG tablet Take 1 tablet (40 mg total) by mouth daily. 09/28/20 09/28/21 Yes Milford, Anderson Malta, FNP  ivabradine (CORLANOR) 7.5 MG TABS tablet Take 1 tablet (7.5 mg total) by mouth 2 (two) times daily with a meal. 08/30/20  Yes Robbie Lis M, PA-C  Mesalamine (ASACOL) 400 MG CPDR DR capsule Take 1 capsule (400 mg total) by mouth daily. 05/30/20  Yes Regalado, Belkys A, MD  ondansetron (ZOFRAN ODT) 4 MG disintegrating tablet Take 1 tablet (4 mg total) by mouth every 8 (eight) hours as needed for nausea or vomiting. 09/28/20  Yes Milford, Anderson Malta, FNP  pantoprazole (PROTONIX) 40 MG tablet Take 1 tablet (40 mg total) by mouth daily. 05/30/20  Yes Regalado, Belkys A, MD  potassium chloride SA (KLOR-CON M20) 20 MEQ tablet Take 1 tablet (20 mEq total) by mouth daily. 09/28/20 12/27/20 Yes Milford, Anderson Malta, FNP  sacubitril-valsartan (ENTRESTO) 24-26 MG Take 1 tablet by mouth 2 (two) times daily. 08/30/20  Yes Robbie Lis M, PA-C  spironolactone (ALDACTONE) 25 MG tablet Take 1 tablet (25 mg total) by mouth daily. 08/30/20  Yes Robbie Lis M, PA-C  SUMAtriptan (IMITREX) 25 MG tablet Take 1 tablet (25 mg total) by mouth once for 1 dose. May repeat in 2 hours if headache persists or recurs. 07/26/20 09/28/20  Merrilee Jansky, MD     Objective:  EXAM:   Vitals:   10/25/20 1347  BP: (!) 88/84  Pulse: (!) 110  SpO2: 99%  Weight: 171 lb (77.6 kg)  Height: 6\' 2"  (1.88 m)    General appearance : A&OX3. NAD. Non-toxic-appearing;  weight was 175 at rheumatology 7/21 HEENT: Atraumatic and Normocephalic.  PERRLA. EOM intact.  Neck: supple, no JVD. No cervical lymphadenopathy. No thyromegaly Chest/Lungs:  Breathing-non-labored, Good air entry bilaterally, breath sounds normal without rales, rhonchi, or wheezing  CVS: S1 S2 regular, no murmurs, gallops, rubs  Extremities: Bilateral Lower Ext shows 1+ edema, both legs are warm to touch  with = pulse throughout Neurology:  CN II-XII grossly intact, Non focal.   Psych:  TP linear. J/I WNL. Normal speech. Appropriate eye contact and affect.  Skin:  No Rash  Data Review No results found for: HGBA1C   Assessment & Plan   1. Heart failure with reduced ejection fraction [facility]) Continue with cardiology f/up 8/12 - Comprehensive metabolic panel  2. Positive ANA (antinuclear antibody) Followed by rheum  3. Crohn's disease with complication, unspecified gastrointestinal tract location (HCC) Followed by [place] - CBC with Differential/Platelet  4. Other acute pulmonary embolism with acute cor pulmonale (HCC) Continue eliquis  5. Hypoalbuminemia - Comprehensive metabolic panel  6. Bilateral leg edema Continue lasix-stable - Comprehensive metabolic panel  7. Hospital discharge follow-up  8. Acute deep vein thrombosis (DVT) of  proximal vein of left lower extremity (HCC) Resolved but continued on Eliquis     Patient have been counseled extensively about nutrition and exercise  Return in about 2 months (around 12/26/2020) for assign to PCP/ongoing health issues.  The patient was given clear instructions to go to ER or return to medical center if symptoms don't improve, worsen or new problems develop. The patient verbalized understanding. The patient was told to call to get lab results if they haven't heard anything in the next week.     Georgian Co, PA-C [facility] [place], [place] [phone]   10/25/2020, 1:59 PM

## 2020-10-26 ENCOUNTER — Telehealth: Payer: Self-pay

## 2020-10-26 LAB — CBC WITH DIFFERENTIAL/PLATELET
Basophils Absolute: 0 10*3/uL (ref 0.0–0.2)
Basos: 1 %
EOS (ABSOLUTE): 0 10*3/uL (ref 0.0–0.4)
Eos: 0 %
Hematocrit: 23.6 % — ABNORMAL LOW (ref 37.5–51.0)
Hemoglobin: 7.7 g/dL — ABNORMAL LOW (ref 13.0–17.7)
Immature Grans (Abs): 0 10*3/uL (ref 0.0–0.1)
Immature Granulocytes: 0 %
Lymphocytes Absolute: 1.2 10*3/uL (ref 0.7–3.1)
Lymphs: 31 %
MCH: 28.8 pg (ref 26.6–33.0)
MCHC: 32.6 g/dL (ref 31.5–35.7)
MCV: 88 fL (ref 79–97)
Monocytes Absolute: 0.2 10*3/uL (ref 0.1–0.9)
Monocytes: 5 %
Neutrophils Absolute: 2.4 10*3/uL (ref 1.4–7.0)
Neutrophils: 63 %
Platelets: 352 10*3/uL (ref 150–450)
RBC: 2.67 x10E6/uL — CL (ref 4.14–5.80)
RDW: 14.5 % (ref 11.6–15.4)
WBC: 3.8 10*3/uL (ref 3.4–10.8)

## 2020-10-26 LAB — COMPREHENSIVE METABOLIC PANEL
ALT: 18 IU/L (ref 0–44)
AST: 14 IU/L (ref 0–40)
Alkaline Phosphatase: 134 IU/L — ABNORMAL HIGH (ref 44–121)
BUN/Creatinine Ratio: 12 (ref 9–20)
BUN: 10 mg/dL (ref 6–20)
Bilirubin Total: <0.2 mg/dL (ref 0.0–1.2)
CO2: 26 mmol/L (ref 20–29)
Calcium: 6.8 mg/dL — CL (ref 8.7–10.2)
Chloride: 105 mmol/L (ref 96–106)
Creatinine, Ser: 0.84 mg/dL (ref 0.76–1.27)
Glucose: 94 mg/dL (ref 65–99)
Potassium: 4 mmol/L (ref 3.5–5.2)
Sodium: 140 mmol/L (ref 134–144)
Total Protein: 3.6 g/dL — CL (ref 6.0–8.5)
eGFR: 127 mL/min/{1.73_m2} (ref >59)

## 2020-10-26 NOTE — Telephone Encounter (Signed)
Tina with [facility] reports critical labs - Calcium - 6.8, total protein 3.6, RBC 2.67. Erica in the practice notified.

## 2020-10-29 NOTE — Telephone Encounter (Signed)
Since albumen is less than1.0, calcium total 6.8 is normal. No action needed

## 2020-10-31 ENCOUNTER — Other Ambulatory Visit: Payer: Self-pay | Admitting: Physician Assistant

## 2020-10-31 DIAGNOSIS — D649 Anemia, unspecified: Secondary | ICD-10-CM

## 2020-11-03 ENCOUNTER — Other Ambulatory Visit: Payer: Self-pay | Admitting: Cardiology

## 2020-11-05 ENCOUNTER — Telehealth: Payer: Self-pay | Admitting: *Deleted

## 2020-11-05 ENCOUNTER — Other Ambulatory Visit: Payer: Self-pay | Admitting: Family

## 2020-11-05 ENCOUNTER — Other Ambulatory Visit: Payer: Self-pay | Admitting: Cardiology

## 2020-11-05 DIAGNOSIS — D649 Anemia, unspecified: Secondary | ICD-10-CM

## 2020-11-05 NOTE — Telephone Encounter (Signed)
Per referral Dr. Sharon Seller - gave upcoming appointments - confirmed and sent welcome packet

## 2020-11-05 NOTE — Telephone Encounter (Signed)
Prescription refill request for Eliquis received. Indication:PE Last office visit:7/1 Scr:0.8 Age: 20 Weight:77.6 kg  Prescription refilled

## 2020-11-07 ENCOUNTER — Inpatient Hospital Stay: Payer: BC Managed Care – PPO | Admitting: Hematology & Oncology

## 2020-11-07 ENCOUNTER — Inpatient Hospital Stay: Payer: BC Managed Care – PPO

## 2020-11-08 NOTE — Progress Notes (Addendum)
Advanced Heart Failure Clinic Note   PCP: Dr. Anna Genre Primary HF Cardiologist: Dr. Shirlee Latch  HPI: 20 y/o male w/ Crohn's disease, asthma, migraine headaches and COVID infection 10/2019, chronic biventricularr failure (new, ? Reverse Takotsubo), and PE.  Presented to ED 05/23/20 w/ complaints of SOB, cough and dizziness and found to have extensive acute bilateral PE w/ evidence of RV strain as well as moderate sized pericardial effusion. LE venous dopplers + for Lt sided DVT. 2D echo showed BiV dysfunction. LVEF 30-35% w/ global hypokensis. RV moderately enlarged w/ moderately reduced systolic function. Moderate TR. No prior studies for comparison.  Cardiology initially consulted. Given hemodynamic stability, there was no immediate indication for TPA nor pericardiocentesis. He was placed on IV heparin.    AHF team consulted given increasing volume overload and persistent tachycardia and hypotension. HR on admission was in the 140s but had improved down to the 110s. Had f/u limited echo and pericardial effusion seems slightly smaller. cMRI showed no myocardial LGE, so no definitive evidence for prior MI, infiltrative disease, or myocarditis. Diffuse HK towards the base and less towards the apex. ? Reverse Takotsubo pattern. Pericardial effusion on MRI felt to be moderate but no tamponade. He was discharged on GDMT and Eliquis. Discharge weight 179 lbs.  Had post hospital f/u at [facility] on 3/9. Volume status ok w/ NYHA Class II symptoms. Losartan transitioned to [place]. Ivabradine increased to 7.5 bid. He was instructed to return back for further med titration w/ plans to repeat echo in 3 months but not seen since post hospital. Per pt report, he was unable to get additional refills of Entresto and stopped after he finished the first 30 day supply. He was still taking spiro and ivabraine.  He presented to the [facility] on 5/29 w/ bilateral leg swelling, progressively worsening over the last 2 weeks.  Denies dyspnea. Reports full med compliance. In ED, CMP showed severe hypoalbuminemia <1.0. UA negative for proteinuria. LFTs normal. Bilateral LE venous dopplers negative for DVT. BNP normal at 53.1. CXR w/o acute cardiopulmonary disease. Echo repeated and improved. LVEF now 50-55%. RV mildly enlarged w/ normal systolic function. Estimated RVSP 27.8 mmHg. Mild TR and small pericardial effusion.    The peripheral edema does not seem to be CHF-related.  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 Discharged with instructions to follow up with GI and Rheumatology (+ANA/SSA). Hold off on RHC for now. Discharge weight 194 lbs.   Today he returns for HF follow up. SOB walking further distances or going up stairs but says he feels otherwise fine. Denies CP, dizziness, edema, or PND/Orthopnea. Appetite ok. No fever or chills. Weight at home 170 pounds. He is taking his lasix every other day. He has not followed up with GI and recently has been referred to Heme/Onc after found to have hgb 7.7 by PCP.  ROS: All systems negative except as listed in HPI, PMH and Problem List.  SH:  Social History   Socioeconomic History   Marital status: Single    Spouse name: Not on file   Number of children: Not on file   Years of education: Not on file   Highest education level: Not on file  Occupational History   Not on file  Tobacco Use   Smoking status: Former    Types: Cigarettes    Quit date: 02/29/2020    Years since quitting: 0.6   Smokeless tobacco: Never  Vaping Use   Vaping Use: Former  Substance and Sexual Activity  Alcohol use: No   Drug use: Never   Sexual activity: Not on file  Other Topics Concern   Not on file  Social History Narrative   Not on file   Social Determinants of Health   Financial Resource Strain: High Risk   Difficulty of Paying Living Expenses: Hard  Food Insecurity: No Food Insecurity   Worried About Running Out of Food in the Last Year: Never true    Ran Out of Food in the Last Year: Never true  Transportation Needs: No Transportation Needs   Lack of Transportation (Medical): No   Lack of Transportation (Non-Medical): No  Physical Activity: Inactive   Days of Exercise per Week: 0 days   Minutes of Exercise per Session: 0 min  Stress: Not on file  Social Connections: Not on file  Intimate Partner Violence: Not on file   FH:  Family History  Problem Relation Age of Onset   Autoimmune disease Mother    Hepatitis Mother    Arthritis Father    Diabetes Father    Seizures Cousin        Epilepsy   Past Medical History:  Diagnosis Date   Asthma    Crohn's disease (HCC) 05/23/2020   Diagnosed ~2019   Deep vein thrombosis (DVT) of left lower extremity (HCC) 05/23/2020   L femoral, L popliteal veins 05/23/2020   Headache(784.0)    Pericardial effusion 05/23/2020   Pulmonary emboli (HCC) 05/23/2020   Bilateral with e/o R heart strain on CTA   Sickle cell trait (HCC) Nov 15, 1999   Current Outpatient Medications  Medication Sig Dispense Refill   apixaban (ELIQUIS) 5 MG TABS tablet TAKE 1 TABLET TWICE A DAY 180 tablet 1   cyclobenzaprine (FLEXERIL) 5 MG tablet Take 1 tablet (5 mg total) by mouth daily as needed for muscle spasms. 30 tablet 0   ENTRESTO 24-26 MG TAKE 1 TABLET BY MOUTH TWICE A DAY 180 tablet 1   ferrous sulfate 325 (65 FE) MG tablet Take 1 tablet (325 mg total) by mouth daily with breakfast. 30 tablet 3   furosemide (LASIX) 40 MG tablet Take 1 tablet (40 mg total) by mouth daily. 90 tablet 3   ivabradine (CORLANOR) 7.5 MG TABS tablet Take 1 tablet (7.5 mg total) by mouth 2 (two) times daily with a meal. 180 tablet 3   Mesalamine (ASACOL) 400 MG CPDR DR capsule Take 1 capsule (400 mg total) by mouth daily. 180 capsule 3   ondansetron (ZOFRAN ODT) 4 MG disintegrating tablet Take 1 tablet (4 mg total) by mouth every 8 (eight) hours as needed for nausea or vomiting. 20 tablet 0   pantoprazole (PROTONIX) 40 MG tablet Take 1  tablet (40 mg total) by mouth daily. 30 tablet 0   potassium chloride SA (KLOR-CON M20) 20 MEQ tablet Take 1 tablet (20 mEq total) by mouth daily. 90 tablet 3   spironolactone (ALDACTONE) 25 MG tablet Take 1 tablet (25 mg total) by mouth daily. 30 tablet 5   SUMAtriptan (IMITREX) 25 MG tablet Take 1 tablet (25 mg total) by mouth once for 1 dose. May repeat in 2 hours if headache persists or recurs. 20 tablet 0   No current facility-administered medications for this encounter.  BP (!) 86/56   Pulse (!) 110   Wt 78 kg (172 lb)   SpO2 100%   BMI 22.08 kg/m   Wt Readings from Last 3 Encounters:  11/09/20 78 kg (172 lb)  10/25/20 77.6 kg (171 lb)  10/18/20 79.5 kg (175 lb 3.2 oz)   PHYSICAL EXAM: General:  NAD. No resp difficulty, thin/pale HEENT: Normal Neck: Supple. No JVD. Carotids 2+ bilat; no bruits. No lymphadenopathy or thryomegaly appreciated. Cor: PMI nondisplaced. Regular rate & rhythm. No rubs, gallops or murmurs. Lungs: Clear Abdomen: Soft, nontender, nondistended. No hepatosplenomegaly. No bruits or masses. Good bowel sounds. Extremities: No cyanosis, clubbing, rash; much-improved 2+ pre-tibial pitting edema Neuro: Alert & oriented x 3, cranial nerves grossly intact. Moves all 4 extremities w/o difficulty. Affect pleasant.  ASSESSMENT & PLAN: 1. Bilateral LEE - h/o DVT 2/22. Remains on Eliquis w/ full compliance. F/u venous dopplers 5/22 negative for DVT - Echo (5/22): w/ improved BiV function, EF normalized 50-55%. RV systolic function normal. RVSP 27   - LEE most likely 2/2 severe hypoalbuminemia <1.0. UA negative for proteinuria. 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 - Elevate legs when at home. Previously had unna boots. - Increase protein portions w/ meals per RD - No need for RHC. - Albumin remains <1.0  2. H/o Biventricular Heart Failure - Echo 2/22 at time of PE diagnosis showed severe RV dysfunction with McConnell's sign and moderate RV  enlargement. LV systolic function also severely reduced at 30%. cMRI showed no myocardial LGE. 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 - Repeat Echo (5/22) showed normalized LVEF, now 50-55%. RV systolic function normal, further supporting likely stress induced CM. - Improved NYHA II, he is not fluid overloaded on exam, leg edema much improved. - Stop lasix and change to 20 mg prn. - Continue spiro 25 mg daily. - Continue Entresto 24-26 mg bid.  - Continue ivabradine 7.5 mg bid.  - BMET last week ok.   3. H/o Unprovoked DVT/PE -  Venous dopplers negative for DVT 5/22. - On Eliquis. No unusual bleeding.  4. Crohn's - On Mesalamine. - I strongly urged him to follow up with Dr. Ewing Schlein. I am concerned with his severe hypoalbuminemia.   5. + ANA. - Followed by rheumatology, Dr. Cliffton Asters.  6. Anemia - Recent hgb down to 7.7. - Has appt with Dr. Myna Hidalgo today. - Denies abnormal bleeding. - On Eliquis.  Follow up w/ Dr. Shirlee Latch 2 months  [facility], FNP-BC 11/09/20

## 2020-11-09 ENCOUNTER — Ambulatory Visit (HOSPITAL_COMMUNITY)
Admission: RE | Admit: 2020-11-09 | Discharge: 2020-11-09 | Disposition: A | Discharge status: Home / Self Care | Payer: BC Managed Care – PPO | Source: Ambulatory Visit | Attending: Family Medicine | Admitting: Family Medicine

## 2020-11-09 ENCOUNTER — Other Ambulatory Visit: Payer: Self-pay

## 2020-11-09 ENCOUNTER — Encounter (HOSPITAL_COMMUNITY): Payer: Self-pay

## 2020-11-09 ENCOUNTER — Encounter: Payer: Self-pay | Admitting: Hematology & Oncology

## 2020-11-09 ENCOUNTER — Inpatient Hospital Stay: Payer: Self-pay | Attending: Hematology & Oncology

## 2020-11-09 ENCOUNTER — Inpatient Hospital Stay (HOSPITAL_BASED_OUTPATIENT_CLINIC_OR_DEPARTMENT_OTHER): Payer: BC Managed Care – PPO | Admitting: Hematology & Oncology

## 2020-11-09 VITALS — BP 86/56 | HR 110 | Wt 172.0 lb

## 2020-11-09 VITALS — BP 78/41 | HR 105 | Temp 98.6°F | Resp 16 | Wt 156.0 lb

## 2020-11-09 DIAGNOSIS — Z87891 Personal history of nicotine dependence: Secondary | ICD-10-CM | POA: Insufficient documentation

## 2020-11-09 DIAGNOSIS — Z7901 Long term (current) use of anticoagulants: Secondary | ICD-10-CM

## 2020-11-09 DIAGNOSIS — R770 Abnormality of albumin: Secondary | ICD-10-CM | POA: Diagnosis not present

## 2020-11-09 DIAGNOSIS — I2699 Other pulmonary embolism without acute cor pulmonale: Secondary | ICD-10-CM | POA: Diagnosis not present

## 2020-11-09 DIAGNOSIS — Z596 Low income: Secondary | ICD-10-CM | POA: Diagnosis not present

## 2020-11-09 DIAGNOSIS — K5090 Crohn's disease, unspecified, without complications: Secondary | ICD-10-CM | POA: Diagnosis not present

## 2020-11-09 DIAGNOSIS — Z86711 Personal history of pulmonary embolism: Secondary | ICD-10-CM | POA: Diagnosis not present

## 2020-11-09 DIAGNOSIS — K50018 Crohn's disease of small intestine with other complication: Secondary | ICD-10-CM

## 2020-11-09 DIAGNOSIS — K50919 Crohn's disease, unspecified, with unspecified complications: Secondary | ICD-10-CM

## 2020-11-09 DIAGNOSIS — I82409 Acute embolism and thrombosis of unspecified deep veins of unspecified lower extremity: Secondary | ICD-10-CM

## 2020-11-09 DIAGNOSIS — I5032 Chronic diastolic (congestive) heart failure: Secondary | ICD-10-CM

## 2020-11-09 DIAGNOSIS — D573 Sickle-cell trait: Secondary | ICD-10-CM

## 2020-11-09 DIAGNOSIS — Z8616 Personal history of COVID-19: Secondary | ICD-10-CM

## 2020-11-09 DIAGNOSIS — E8809 Other disorders of plasma-protein metabolism, not elsewhere classified: Secondary | ICD-10-CM | POA: Insufficient documentation

## 2020-11-09 DIAGNOSIS — Z79899 Other long term (current) drug therapy: Secondary | ICD-10-CM | POA: Diagnosis not present

## 2020-11-09 DIAGNOSIS — I509 Heart failure, unspecified: Secondary | ICD-10-CM | POA: Insufficient documentation

## 2020-11-09 DIAGNOSIS — R600 Localized edema: Secondary | ICD-10-CM | POA: Diagnosis not present

## 2020-11-09 DIAGNOSIS — D509 Iron deficiency anemia, unspecified: Secondary | ICD-10-CM

## 2020-11-09 DIAGNOSIS — D649 Anemia, unspecified: Secondary | ICD-10-CM | POA: Diagnosis not present

## 2020-11-09 DIAGNOSIS — R768 Other specified abnormal immunological findings in serum: Secondary | ICD-10-CM

## 2020-11-09 DIAGNOSIS — I5181 Takotsubo syndrome: Secondary | ICD-10-CM | POA: Diagnosis not present

## 2020-11-09 DIAGNOSIS — Z86718 Personal history of other venous thrombosis and embolism: Secondary | ICD-10-CM | POA: Diagnosis not present

## 2020-11-09 DIAGNOSIS — I5082 Biventricular heart failure: Secondary | ICD-10-CM | POA: Insufficient documentation

## 2020-11-09 DIAGNOSIS — D631 Anemia in chronic kidney disease: Secondary | ICD-10-CM

## 2020-11-09 LAB — CBC WITH DIFFERENTIAL (CANCER CENTER ONLY)
Abs Immature Granulocytes: 0.01 10*3/uL (ref 0.00–0.07)
Basophils Absolute: 0 10*3/uL (ref 0.0–0.1)
Basophils Relative: 1 %
Eosinophils Absolute: 0 10*3/uL (ref 0.0–0.5)
Eosinophils Relative: 1 %
HCT: 21.3 % — ABNORMAL LOW (ref 39.0–52.0)
Hemoglobin: 7.2 g/dL — ABNORMAL LOW (ref 13.0–17.0)
Immature Granulocytes: 0 %
Lymphocytes Relative: 35 %
Lymphs Abs: 1.4 10*3/uL (ref 0.7–4.0)
MCH: 28.5 pg (ref 26.0–34.0)
MCHC: 33.8 g/dL (ref 30.0–36.0)
MCV: 84.2 fL (ref 80.0–100.0)
Monocytes Absolute: 0.2 10*3/uL (ref 0.1–1.0)
Monocytes Relative: 5 %
Neutro Abs: 2.3 10*3/uL (ref 1.7–7.7)
Neutrophils Relative %: 58 %
Platelet Count: 389 10*3/uL (ref 150–400)
RBC: 2.53 MIL/uL — ABNORMAL LOW (ref 4.22–5.81)
RDW: 15 % (ref 11.5–15.5)
WBC Count: 4 10*3/uL (ref 4.0–10.5)
nRBC: 0 % (ref 0.0–0.2)

## 2020-11-09 LAB — RETICULOCYTES
Immature Retic Fract: 15.8 % (ref 2.3–15.9)
RBC.: 2.53 MIL/uL — ABNORMAL LOW (ref 4.22–5.81)
Retic Count, Absolute: 34.9 10*3/uL (ref 19.0–186.0)
Retic Ct Pct: 1.4 % (ref 0.4–3.1)

## 2020-11-09 LAB — CMP (CANCER CENTER ONLY)
ALT: 21 U/L (ref 0–44)
AST: 13 U/L — ABNORMAL LOW (ref 15–41)
Albumin: 1.2 g/dL — ABNORMAL LOW (ref 3.5–5.0)
Alkaline Phosphatase: 133 U/L — ABNORMAL HIGH (ref 38–126)
Anion gap: 7 (ref 5–15)
BUN: 13 mg/dL (ref 6–20)
CO2: 23 mmol/L (ref 22–32)
Calcium: 7.1 mg/dL — ABNORMAL LOW (ref 8.9–10.3)
Chloride: 107 mmol/L (ref 98–111)
Creatinine: 1.09 mg/dL (ref 0.61–1.24)
GFR, Estimated: >60 mL/min (ref >60)
Glucose, Bld: 99 mg/dL (ref 70–99)
Potassium: 3.7 mmol/L (ref 3.5–5.1)
Sodium: 137 mmol/L (ref 135–145)
Total Bilirubin: 0.2 mg/dL — ABNORMAL LOW (ref 0.3–1.2)
Total Protein: 3.8 g/dL — ABNORMAL LOW (ref 6.5–8.1)

## 2020-11-09 LAB — SAMPLE TO BLOOD BANK

## 2020-11-09 LAB — SAVE SMEAR(SSMR), FOR PROVIDER SLIDE REVIEW

## 2020-11-09 LAB — LACTATE DEHYDROGENASE: LDH: 151 U/L (ref 98–192)

## 2020-11-09 MED ORDER — FUROSEMIDE 40 MG PO TABS: 40.0000 mg | ORAL_TABLET | ORAL | 3 refills | Status: AC | PRN

## 2020-11-09 NOTE — Progress Notes (Addendum)
Referral MD  Reason for Referral: Normochromic and normocytic anemia  Chief Complaint  Patient presents with   New Patient (Initial Visit)  : My blood is quite low.  HPI: Glenn [name] is a very nice 20 year old white male.  I must say he has incredible history.  For a 20 year old, I have never seen so many issues.  He has heart failure.  He has had Crohn's disease.  He has had a pulmonary embolus and lower extremity thrombus which may have been from COVID.  He reports to have sickle cell trait which I also find hard to believe.  He has this marked anemia.  About 2 months ago, his white cell count was 5.1.  Hemoglobin 9.2.  Platelet count 309,000.  Back in July 2019, this was last August see that he had a relatively normal blood count.  His white cell count was 13.8.  Hemoglobin 12 in platelet count 605,000.  However, MCV was 64.  According to his chart, he has sickle cell trait.  I am somewhat surprised by this and see is not African-American.  I will have to ask to see if there is any African-American ancestry.  He had a blood clot in his lung and leg back in February.  He had COVID back and October 2021.  I am not sure this was a factor.  He currently is on Eliquis.  He has had no obvious bleeding.  Has Crohn's disease.    He does not smoke.  He does not drink.  He has not had any surgery in the past.  As far as he knows, there has been no problems with the family with anemia or blood clots.  I am not sure as to why has the heart failure.  He is on cardiac medications for this.  He also has an incredibly low albumin.  I used spot urine back in May did not show any protein in his urine.  Again I have no clue why he would have an albumin as low as he does.  He is not a vegetarian.  He has had no rashes.  No obvious change in bowel or bladder habits.  He has had no mouth sores.  He is had no issues with swallowing.  Is no cough or shortness of breath.  Overall, I would say his  performance status is ECOG 1.   Past Medical History:  Diagnosis Date   Asthma    Crohn's disease (HCC) 05/23/2020   Diagnosed ~2019   Deep vein thrombosis (DVT) of left lower extremity (HCC) 05/23/2020   L femoral, L popliteal veins 05/23/2020   Headache(784.0)    Pericardial effusion 05/23/2020   Pulmonary emboli (HCC) 05/23/2020   Bilateral with e/o R heart strain on CTA   Sickle cell trait (HCC) 07-Mar-2000  :   Past Surgical History:  Procedure Laterality Date   CIRCUMCISION    :   Current Outpatient Medications:    apixaban (ELIQUIS) 5 MG TABS tablet, TAKE 1 TABLET TWICE A DAY, Disp: 180 tablet, Rfl: 1   cyclobenzaprine (FLEXERIL) 5 MG tablet, Take 1 tablet (5 mg total) by mouth daily as needed for muscle spasms., Disp: 30 tablet, Rfl: 0   ENTRESTO 24-26 MG, TAKE 1 TABLET BY MOUTH TWICE A DAY, Disp: 180 tablet, Rfl: 1   ferrous sulfate 325 (65 FE) MG tablet, Take 1 tablet (325 mg total) by mouth daily with breakfast., Disp: 30 tablet, Rfl: 3   furosemide (LASIX) 40 MG tablet, Take  1 tablet (40 mg total) by mouth as needed (3lb weight gain over night or 5 lb weight gain in a week)., Disp: 90 tablet, Rfl: 3   ivabradine (CORLANOR) 7.5 MG TABS tablet, Take 1 tablet (7.5 mg total) by mouth 2 (two) times daily with a meal., Disp: 180 tablet, Rfl: 3   Mesalamine (ASACOL) 400 MG CPDR DR capsule, Take 1 capsule (400 mg total) by mouth daily., Disp: 180 capsule, Rfl: 3   ondansetron (ZOFRAN ODT) 4 MG disintegrating tablet, Take 1 tablet (4 mg total) by mouth every 8 (eight) hours as needed for nausea or vomiting., Disp: 20 tablet, Rfl: 0   pantoprazole (PROTONIX) 40 MG tablet, Take 1 tablet (40 mg total) by mouth daily., Disp: 30 tablet, Rfl: 0   potassium chloride SA (KLOR-CON M20) 20 MEQ tablet, Take 1 tablet (20 mEq total) by mouth daily., Disp: 90 tablet, Rfl: 3   spironolactone (ALDACTONE) 25 MG tablet, Take 1 tablet (25 mg total) by mouth daily., Disp: 30 tablet, Rfl: 5    SUMAtriptan (IMITREX) 25 MG tablet, Take 1 tablet (25 mg total) by mouth once for 1 dose. May repeat in 2 hours if headache persists or recurs., Disp: 20 tablet, Rfl: 0:  :   Allergies  Allergen Reactions   Penicillins Hives   Amoxicillin Hives   Cefdinir Hives  :   Family History  Problem Relation Age of Onset   Autoimmune disease Mother    Hepatitis Mother    Arthritis Father    Diabetes Father    Seizures Cousin        Epilepsy  :   Social History   Socioeconomic History   Marital status: Single    Spouse name: Not on file   Number of children: Not on file   Years of education: Not on file   Highest education level: Not on file  Occupational History   Not on file  Tobacco Use   Smoking status: Former    Types: Cigarettes    Quit date: 02/29/2020    Years since quitting: 0.6   Smokeless tobacco: Never  Vaping Use   Vaping Use: Former  Substance and Sexual Activity   Alcohol use: No   Drug use: Never   Sexual activity: Not on file  Other Topics Concern   Not on file  Social History Narrative   Not on file   Social Determinants of Health   Financial Resource Strain: High Risk   Difficulty of Paying Living Expenses: Hard  Food Insecurity: No Food Insecurity   Worried About Running Out of Food in the Last Year: Never true   Ran Out of Food in the Last Year: Never true  Transportation Needs: No Transportation Needs   Lack of Transportation (Medical): No   Lack of Transportation (Non-Medical): No  Physical Activity: Inactive   Days of Exercise per Week: 0 days   Minutes of Exercise per Session: 0 min  Stress: Not on file  Social Connections: Not on file  Intimate Partner Violence: Not on file  :  Review of Systems  Constitutional:  Positive for malaise/fatigue.  HENT: Negative.    Eyes: Negative.   Respiratory: Negative.    Cardiovascular:  Positive for leg swelling.  Gastrointestinal: Negative.   Genitourinary: Negative.   Musculoskeletal:  Negative.   Skin: Negative.   Neurological: Negative.   Endo/Heme/Allergies: Negative.   Psychiatric/Behavioral: Negative.      Exam:  @ This is a thin white male in no  obvious distress.  Vital signs show temperature of 98.6.  Pulse 105.  Blood pressure 78/41.  Weight is 156 pounds.  Head and neck exam shows no scleral icterus.  He has no oral lesions.  He has no adenopathy in the neck.  Thyroid is nonpalpable.  Lungs are clear bilaterally.  Cardiac exam tachycardic but regular.  He has no obvious murmurs.  Abdomen is soft.  He has decent bowel sounds.  There is no fluid wave.  There is no guarding or rebound tenderness.  There is no palpable liver or spleen tip.  Back exam shows no tenderness over the spine, ribs or hips.  Extremities shows edema in the lower legs.  Has about 1+ edema in the legs.  He has decent joint range of motion.  There is no joint swelling.  Neurological exam is nonfocal.  Skin exam shows no rashes, ecchymoses or petechia.  Recent Labs    11/09/20 1515  WBC 4.0  HGB 7.2*  HCT 21.3*  PLT 389    Recent Labs    11/09/20 1515  NA 137  K 3.7  CL 107  CO2 23  GLUCOSE 99  BUN 13  CREATININE 1.09  CALCIUM 7.1*    Blood smear review: He has normochromic and normocytic red blood cells.  I see no target cells.  He has no nucleated red blood cells.  There is no schistocytes or spherocytes.  I see no rouleaux formation.  White cells are mature in morphology.  He has no hypersegmented polys.  I see no immature myeloid cells.  He has no blasts.  Platelets are adequate in number and size.  Platelets are well granulated.  Pathology: None    Assessment and Plan: Glenn [name] is a nice 20 year old white male.  He has an incredible history.  I also recommended that he does have some rheumatologic issue as he has positive rheumatologic tests.  I am not sure how to put all this together.  I would have to think that there is some type of unifying diagnosis.  Again I  just find it incredibly interesting that he has an albumin of only 1.2.  And the fact that he does not have protein in his urine.  His blood smear certainly does not look all that unusual.  I think that what is very telling to the fact that his corrected reticulocyte count is about 1%.  This tells me that his bone marrow is not getting the signal to make blood.  As such, I would think that he either is still iron deficient or that he has a low erythropoietin level.  I am not sure why he would have the blood clot.  I guess this might still be from COVID.  I would keep him on anticoagulation for now.  I am just very puzzled as to what might be going on.  He seem fairly healthy.  He is eating okay.  His iron studies showing ferritin 135 with an iron saturation of 101%.  He clearly does not need oral iron.  His vitamin B 12 level was 1400 back in May.  He does actually have sickle cell trait.  I will put him on folic acid 2 mg a day.  His erythropoietin level is 59.  I think this is quite low for his degree of anemia.  As such, we will try him on some Aranesp to see if this cannot get his hemoglobin higher.  I spent a good hour with him.  Again I was puzzled as to what is actually going on with him.  We will certainly try to figure this out and try to get his blood count better.  I will plan to get him back once we get our results in.  Ultimately, we still may have to think about a bone marrow test on him.  However, this would be the test of last resort.  I do not see he needs any scans.  He had CAT scans done back in February.  This is where they show the pulmonary emboli.  He had some changes in the bowels consistent with inflammatory bowel disease.  There was some mesenteric lymphadenopathy which were felt to be reactive.  We may have to watch this.

## 2020-11-09 NOTE — Addendum Note (Signed)
Encounter addended by: Jacklynn Ganong, FNP on: 11/09/2020 3:22 PM  Actions taken: Clinical Note Signed

## 2020-11-09 NOTE — Patient Instructions (Signed)
CHANGE Lasix to 20 mg as needed for weight gain ( 3lb weight gain overnight or 5 lb weight gain in a week)  Your physician recommends that you schedule a follow-up appointment in: 6-8 weeks with Dr Shirlee Latch  Do the following things EVERYDAY: Weigh yourself in the morning before breakfast. Write it down and keep it in a log. Take your medicines as prescribed Eat low salt foods--Limit salt (sodium) to 2000 mg per day.  Stay as active as you can everyday Limit all fluids for the day to less than 2 liters  milAt the [facility], you and your health needs are our priority. As part of our continuing mission to provide you with exceptional heart care, we have created designated Provider Care Teams. These Care Teams include your primary Cardiologist (physician) and Advanced Practice Providers (APPs- Physician Assistants and Nurse Practitioners) who all work together to provide you with the care you need, when you need it.   You may see any of the following providers on your designated Care Team at your next follow up: Dr Arvilla Meres Dr Marca Ancona Dr Brandon Melnick, NP Robbie Lis, [place] Mikki Santee Karle Plumber, PharmD   Please be sure to bring in all your medications bottles to every appointment.   If you have any questions or concerns before your next appointment please send [place] a message through [place] or call our office at [phone].    TO LEAVE A MESSAGE FOR THE NURSE SELECT OPTION 2, PLEASE LEAVE A MESSAGE INCLUDING: YOUR NAME DATE OF BIRTH CALL BACK NUMBER REASON FOR CALL**this is important as we prioritize the call backs  YOU WILL RECEIVE A CALL BACK THE SAME DAY AS LONG AS YOU CALL BEFORE 4:00 PM

## 2020-11-11 LAB — ERYTHROPOIETIN: Erythropoietin: 59.3 m[IU]/mL — ABNORMAL HIGH (ref 2.6–18.5)

## 2020-11-12 ENCOUNTER — Telehealth: Payer: Self-pay | Admitting: Hematology & Oncology

## 2020-11-12 LAB — IRON AND TIBC
Iron: 71 ug/dL (ref 42–163)
Saturation Ratios: 101 % — ABNORMAL HIGH (ref 20–55)
TIBC: 70 ug/dL — ABNORMAL LOW (ref 202–409)
UIBC: UNDETERMINED ug/dL (ref 117–376)

## 2020-11-12 LAB — FERRITIN: Ferritin: 135 ng/mL (ref 24–336)

## 2020-11-12 NOTE — Telephone Encounter (Signed)
No los 8/12 

## 2020-11-13 LAB — HGB FRACTIONATION CASCADE
Hgb A2: 3.5 % — ABNORMAL HIGH (ref 1.8–3.2)
Hgb A: 57.2 % — ABNORMAL LOW (ref 96.4–98.8)
Hgb F: 0 % (ref 0.0–2.0)
Hgb S: 39.3 % — ABNORMAL HIGH

## 2020-11-13 LAB — HGB SOLUBILITY: Hgb Solubility: POSITIVE — AB

## 2020-11-16 ENCOUNTER — Encounter: Payer: Self-pay | Admitting: Hematology & Oncology

## 2020-11-16 ENCOUNTER — Encounter: Payer: Self-pay | Admitting: *Deleted

## 2020-11-16 DIAGNOSIS — D631 Anemia in chronic kidney disease: Secondary | ICD-10-CM

## 2020-11-16 DIAGNOSIS — D573 Sickle-cell trait: Secondary | ICD-10-CM | POA: Insufficient documentation

## 2020-11-16 HISTORY — DX: Anemia in chronic kidney disease: D63.1

## 2020-11-16 MED ORDER — FOLIC ACID 1 MG PO TABS: 2.0000 mg | ORAL_TABLET | Freq: Every day | ORAL | 12 refills | Status: AC

## 2020-11-16 NOTE — Addendum Note (Signed)
Addended by: Arlan Organ R on: 11/16/2020 03:31 PM   Modules accepted: Orders

## 2020-11-19 ENCOUNTER — Telehealth: Payer: Self-pay

## 2020-11-19 ENCOUNTER — Encounter: Payer: Self-pay | Admitting: Hematology & Oncology

## 2020-11-20 ENCOUNTER — Other Ambulatory Visit: Payer: Self-pay | Admitting: Family

## 2020-11-20 ENCOUNTER — Encounter: Payer: Self-pay | Admitting: Family

## 2020-11-20 DIAGNOSIS — D649 Anemia, unspecified: Secondary | ICD-10-CM | POA: Insufficient documentation

## 2020-11-22 ENCOUNTER — Encounter: Payer: Self-pay | Admitting: Family

## 2020-11-22 ENCOUNTER — Inpatient Hospital Stay: Payer: Self-pay

## 2020-11-22 ENCOUNTER — Other Ambulatory Visit: Payer: Self-pay

## 2020-12-25 ENCOUNTER — Ambulatory Visit: Payer: BC Managed Care – PPO | Admitting: Critical Care Medicine

## 2020-12-25 NOTE — Progress Notes (Deleted)
Established Patient Office Visit  Subjective:  Patient ID: Glenn [name], male    DOB: [birth year]  Age: 20 y.o. MRN: [id number]  CC: No chief complaint on file.   HPI Glenn [name] presents for *** Jacques Earthly 10/25/20 Chief Complaint and HPI: Glenn [name] is a 20 y.o. male here today to establish care and for a follow up visit After hospitalization 5/29-6/02.  Saw rheumatology 10/18/2020.  Dr Daisey Must follows for Crohn's.  Has cardiology follow up 11/09/2020.  He is feeling good overall.  Weights have been stable and swelling has improved on lasix.  He says his BP runs low and he is asymptomatic.  He does not RF of anything currently.   He says he was diagnosed with Crohn's about 2019 but has been otherwise healthy until having severe Covid in 10/2019.       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   Presented to ED 05/23/20 w/ complaints of SOB, cough and dizziness and found to have extensive acute bilateral PE w/ evidence of RV strain as well as moderate sized pericardial effusion. LE venous dopplers + for Lt sided DVT. 2D echo showed BiV dysfunction. LVEF 30-35% w/ global hypokensis. RV moderately enlarged w/ moderately reduced systolic function. Moderate TR. No prior studies for comparison.  Cardiology initially consulted. Given hemodynamic stability, there was no immediate indication for TPA nor pericardiocentesis. He was placed on IV heparin.   AHF team was consulted given increasing volume overload and persistent tachycardia and hypotension. HR on admission was in the 140s but had  improved down to the 110s. Had f/u limited echo and pericardial effusion seems slightly smaller. cMRI showed no myocardial LGE, so no definitive evidence for prior MI, infiltrative disease, or myocarditis. Diffuse HK towards the base and less towards the apex. ? Reverse Takotsubo pattern. Pericardial effusion on MRI felt to be moderate but no tamponade. He was discharged on GDMT and Eliquis. Discharge weight 179 lbs.   Had post hospital f/u at [facility] on 3/9. Volume status ok w/ NYHA Class II symptoms. Losartan transitioned to [place]. Ivabradine increased to 7.5 bid. He was instructed to return back for further med titration w/ plans to repeat echo in 3 months but not seen since post hospital. Per pt report, he was unable to get additional refills of Entresto and stopped after he finished the first 30 day supply. He was still taking spiro and ivabraine.    He [facility] to the [facility] on [facility]29 w/ bilateral leg swelling, progressively worsening over the last 2 weeks. Denies dyspnea. Reports full med compliance. In ED, CMP showed severe hypoalbuminemia <1.0. UA negative for proteinuria. LFTs normal. Bilateral LE venous dopplers negative for DVT. BNP normal at 53.1. CXR w/o acute cardiopulmonary disease. EKG NSR 96 bpm. Echo repeated and improved. LVEF now 50-55%. RV mildly enlarged w/ normal systolic function. Estimated RVSP 27.8 mmHg. Mild TR and small pericardial effusion.    The peripheral edema does not seem to be CHF-related.  His LV EF has improved to 50-55% (consistent with recovery of a Takotsubo-type cardiomyopathy).  Normal BNP. JVP not significantly elevated and IVC  small on echo. LE Venous dopplers negative for DVT. I suspect that his peripheral edema is related to his low oncotic pressure with albumin < 1.  He does not have proteinuria on UA, given his Crohns history, I am concerned for a protein-losing enteropathy versus malabsorption.  Albumin level has steadily fallen over time.   - Would continue  cardiac regimen for now, Entresto/ivabradine/spironolactone. - Unna boots on, would send home with unna boots. - Have asked GI to see given Crohns and concern for a protein-losing enteropathy versus malabsorption.  No new recommendations, followup with outpatient GI.  I am very concerned in this young patient with very low albumin, worry about Crohns complication, may end up needing TPN if malabsorbing.  Will make sure he has close GI followup as outpatient. - Think we can hold off on RHC.    Also ANA+ with SSA+.  He will be referred to rheumatology for further w/u.   On 6/2, he was last seen and examined by Dr. Aundra Dubin and felt stable for d/c home. Post hospital f/u arranged in [facility] + w/ GI. Rheumatology referral placed.    From rheumatology A/P: Assessment / Plan:     Visit Diagnoses: Positive ANA (antinuclear antibody)  Pericardial effusion Other acute pulmonary embolism with acute cor pulmonale ([place]) - Plan: Sjogren's syndrome antibods(ssa + ssb), Rheumatoid factor, IgG, IgA, IgM, ANA, C3 and C4   Positive ANA with history of pulmonary emboli and pericardial effusion though I do not see much systemic evidence for disease activity.  We will recheck antibody titer also checking rheumatoid factor complements and quantitative immunoglobulins.  Without more specific results or change in symptoms would not currently attribute his symptoms to a particular systemic connective tissue disease at this time.   Crohn's disease with complication, unspecified gastrointestinal tract location [facility]) Polyarthralgia Chronic bilateral low back pain without sciatica - Plan: XR Pelvis 1-2 Views, XR Lumbar Spine 2-3 Views   History of Crohn's disease apparently is well controlled based on most recent evaluation though I am very concerned with his severe hypoalbuminemia.  He describes more oligoarticular peripheral joint involvement and more upper axial symptoms but will check baseline radiographs for SI and lumbar  spine just given his history and his oligoarthritis.   Neck pain - Plan: cyclobenzaprine (FLEXERIL) 5 MG tablet   Neck pain is localized over paraspinal muscles and laterally suspect that this is more postural or work-related.  Recommended trial of low-dose cyclobenzaprine as needed see if there is a significant improvement or not.  Discussed trying medicine first at night due to risks of drowsiness and CNS depression.   ED/Hospital notes reviewed.    1. Heart failure with reduced ejection fraction [facility]) Continue with cardiology f/up 8/12 - Comprehensive metabolic panel   2. Positive ANA (antinuclear antibody) Followed by rheum   3. Crohn's disease with complication, unspecified gastrointestinal tract location ([place]) Followed by [place] - CBC with Differential/Platelet   4. Other acute pulmonary embolism with acute cor pulmonale (HCC) Continue eliquis   5. Hypoalbuminemia - Comprehensive metabolic panel   6. Bilateral leg edema Continue lasix-stable - Comprehensive metabolic panel   7. Hospital discharge follow-up   8. Acute deep vein thrombosis (DVT) of proximal vein of left lower extremity (HCC) Resolved but continued on Eliquis   Saw [facility] 10/2020 Glenn [name] is a nice 20 year old white male.  He has an incredible history.  I also recommended that he does have some rheumatologic issue as he has positive rheumatologic tests.  I  am not sure how to put all this together.  I would have to think that there is some type of unifying diagnosis.  Again I just find it incredibly interesting that he has an albumin of only 1.2.  And the fact that he does not have protein in his urine.  His blood smear certainly does not look all that unusual.  I think that what is very telling to the fact that his corrected reticulocyte count is about 1%.  This tells me that his bone marrow is not getting the signal to make blood.  As such, I would think that he either is still iron deficient or that he  has a low erythropoietin level.  I am not sure why he would have the blood clot.  I guess this might still be from [place].  I would keep him on anticoagulation for now.  I am just very puzzled as to what might be going on.  He seem fairly healthy.  He is eating okay.  His iron studies showing ferritin 135 with an iron saturation of 101%.  He clearly does not need oral iron.  His vitamin B 12 level was 1400 back in May.   He does actually have sickle cell trait.  I will put him on folic acid 2 mg a day.  His erythropoietin level is 59.  I think this is quite low for his degree of anemia.  As such, we will try him on some Aranesp to see if this cannot get his hemoglobin higher.  I spent a good hour with him.  Again I was puzzled as to what is actually going on with him.  We will certainly try to figure this out and try to get his blood count better.  I will plan to get him back once we get our results in.  Ultimately, we still may have to think about a bone marrow test on him.  However, this would be the test of last resort.  I do not see he needs any scans.  He had CAT scans done back in February.  This is where they show the pulmonary emboli.  He had some changes in the bowels consistent with inflammatory bowel disease.  There was some mesenteric lymphadenopathy which were felt to be reactive.  We may have to watch this.     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   Headache(784.0)    Pericardial effusion 05/23/2020   Pulmonary emboli ([place]) 05/23/2020   Bilateral with e/o R heart strain on CTA   Sickle cell trait ([place]) 1999[birth year]06-03   Sickle cell trait ([place]) 11/16/2020    Past Surgical History:  Procedure Laterality Date   CIRCUMCISION      Family History  Problem  Relation Age of Onset   Autoimmune disease Mother    Hepatitis Mother    Arthritis Father    Diabetes Father    Seizures Cousin        Epilepsy    Social History   Socioeconomic History   Marital status: Single    Spouse name: Not on file   Number of children: Not on file   Years of education: Not on file   Highest education level: Not on file  Occupational History  Not on file  Tobacco Use   Smoking status: Former    Types: Cigarettes    Quit date: 02/29/2020    Years since quitting: 0.8   Smokeless tobacco: Never  Vaping Use   Vaping Use: Former  Substance and Sexual Activity   Alcohol use: No   Drug use: Never   Sexual activity: Not on file  Other Topics Concern   Not on file  Social History Narrative   Not on file   Social Determinants of Health   Financial Resource Strain: High Risk   Difficulty of Paying Living Expenses: Hard  Food Insecurity: No Food Insecurity   Worried About Running Out of Food in the Last Year: Never true   Ran Out of Food in the Last Year: Never true  Transportation Needs: No Transportation Needs   Lack of Transportation (Medical): No   Lack of Transportation (Non-Medical): No  Physical Activity: Inactive   Days of Exercise per Week: 0 days   Minutes of Exercise per Session: 0 min  Stress: Not on file  Social Connections: Not on file  Intimate Partner Violence: Not on file    Outpatient Medications Prior to Visit  Medication Sig Dispense Refill   apixaban (ELIQUIS) 5 MG TABS tablet TAKE 1 TABLET TWICE A DAY 180 tablet 1   cyclobenzaprine (FLEXERIL) 5 MG tablet Take 1 tablet (5 mg total) by mouth daily as needed for muscle spasms. 30 tablet 0   ENTRESTO 24-26 MG TAKE 1 TABLET BY MOUTH TWICE A DAY 037 tablet 1   folic acid (FOLVITE) 1 MG tablet Take 2 tablets (2 mg total) by mouth daily. 60 tablet 12   furosemide (LASIX) 40 MG tablet Take 1 tablet (40 mg total) by mouth as needed (3lb weight gain over night or 5 lb weight gain in a  week). 90 tablet 3   ivabradine (CORLANOR) 7.5 MG TABS tablet Take 1 tablet (7.5 mg total) by mouth 2 (two) times daily with a meal. 180 tablet 3   Mesalamine (ASACOL) 400 MG CPDR DR capsule Take 1 capsule (400 mg total) by mouth daily. 180 capsule 3   ondansetron (ZOFRAN ODT) 4 MG disintegrating tablet Take 1 tablet (4 mg total) by mouth every 8 (eight) hours as needed for nausea or vomiting. 20 tablet 0   pantoprazole (PROTONIX) 40 MG tablet Take 1 tablet (40 mg total) by mouth daily. 30 tablet 0   potassium chloride SA (KLOR-CON M20) 20 MEQ tablet Take 1 tablet (20 mEq total) by mouth daily. 90 tablet 3   spironolactone (ALDACTONE) 25 MG tablet Take 1 tablet (25 mg total) by mouth daily. 30 tablet 5   SUMAtriptan (IMITREX) 25 MG tablet Take 1 tablet (25 mg total) by mouth once for 1 dose. May repeat in 2 hours if headache persists or recurs. 20 tablet 0   No facility-administered medications prior to visit.    Allergies  Allergen Reactions   Penicillins Hives   Amoxicillin Hives   Cefdinir Hives    ROS Review of Systems    Objective:    Physical Exam  There were no vitals taken for this visit. Wt Readings from Last 3 Encounters:  11/09/20 172 lb (78 kg)  11/09/20 156 lb (70.8 kg)  10/25/20 171 lb (77.6 kg)     Health Maintenance Due  Topic Date Due   COVID-19 Vaccine (1) Never done   HPV VACCINES (1 - Male 2-dose series) Never done   Hepatitis C Screening  Never  done   TETANUS/TDAP  10/24/2020   INFLUENZA VACCINE  10/29/2020       Topic Date Due   HPV VACCINES (1 - Male 2-dose series) Never done    Lab Results  Component Value Date   TSH 5.415 (H) 08/26/2020   Lab Results  Component Value Date   WBC 4.0 11/09/2020   HGB 7.2 (L) 11/09/2020   HCT 21.3 (L) 11/09/2020   MCV 84.2 11/09/2020   PLT 389 11/09/2020   Lab Results  Component Value Date   NA 137 11/09/2020   K 3.7 11/09/2020   CO2 23 11/09/2020   GLUCOSE 99 11/09/2020   BUN 13 11/09/2020    CREATININE 1.09 11/09/2020   BILITOT 0.2 (L) 11/09/2020   ALKPHOS 133 (H) 11/09/2020   AST 13 (L) 11/09/2020   ALT 21 11/09/2020   PROT 3.8 (L) 11/09/2020   ALBUMIN 1.2 (L) 11/09/2020   CALCIUM 7.1 (L) 11/09/2020   ANIONGAP 7 11/09/2020   EGFR 127 10/25/2020   No results found for: CHOL No results found for: HDL No results found for: LDLCALC No results found for: TRIG No results found for: CHOLHDL No results found for: HGBA1C    Assessment & Plan:   Problem List Items Addressed This Visit   None   No orders of the defined types were placed in this encounter.   Follow-up: No follow-ups on file.    Asencion Noble, MD

## 2020-12-26 ENCOUNTER — Other Ambulatory Visit: Payer: BC Managed Care – PPO

## 2020-12-26 ENCOUNTER — Ambulatory Visit: Payer: BC Managed Care – PPO | Admitting: Family

## 2020-12-27 ENCOUNTER — Inpatient Hospital Stay: Payer: Self-pay | Attending: Hematology & Oncology

## 2020-12-27 ENCOUNTER — Inpatient Hospital Stay: Payer: Self-pay | Admitting: Hematology & Oncology

## 2020-12-27 ENCOUNTER — Inpatient Hospital Stay: Payer: Self-pay

## 2020-12-27 ENCOUNTER — Other Ambulatory Visit (HOSPITAL_COMMUNITY): Payer: Self-pay

## 2021-01-08 ENCOUNTER — Encounter (HOSPITAL_COMMUNITY): Payer: BC Managed Care – PPO | Admitting: Cardiology

## 2023-01-13 IMAGING — DX DG CHEST 1V PORT
1 series · 1 of 1 positions shown · non-contrast
Comparison: 08/18/2020

CLINICAL DATA: Bilateral foot swelling, initial encounter

EXAM:
PORTABLE CHEST 1 VIEW

[chest]
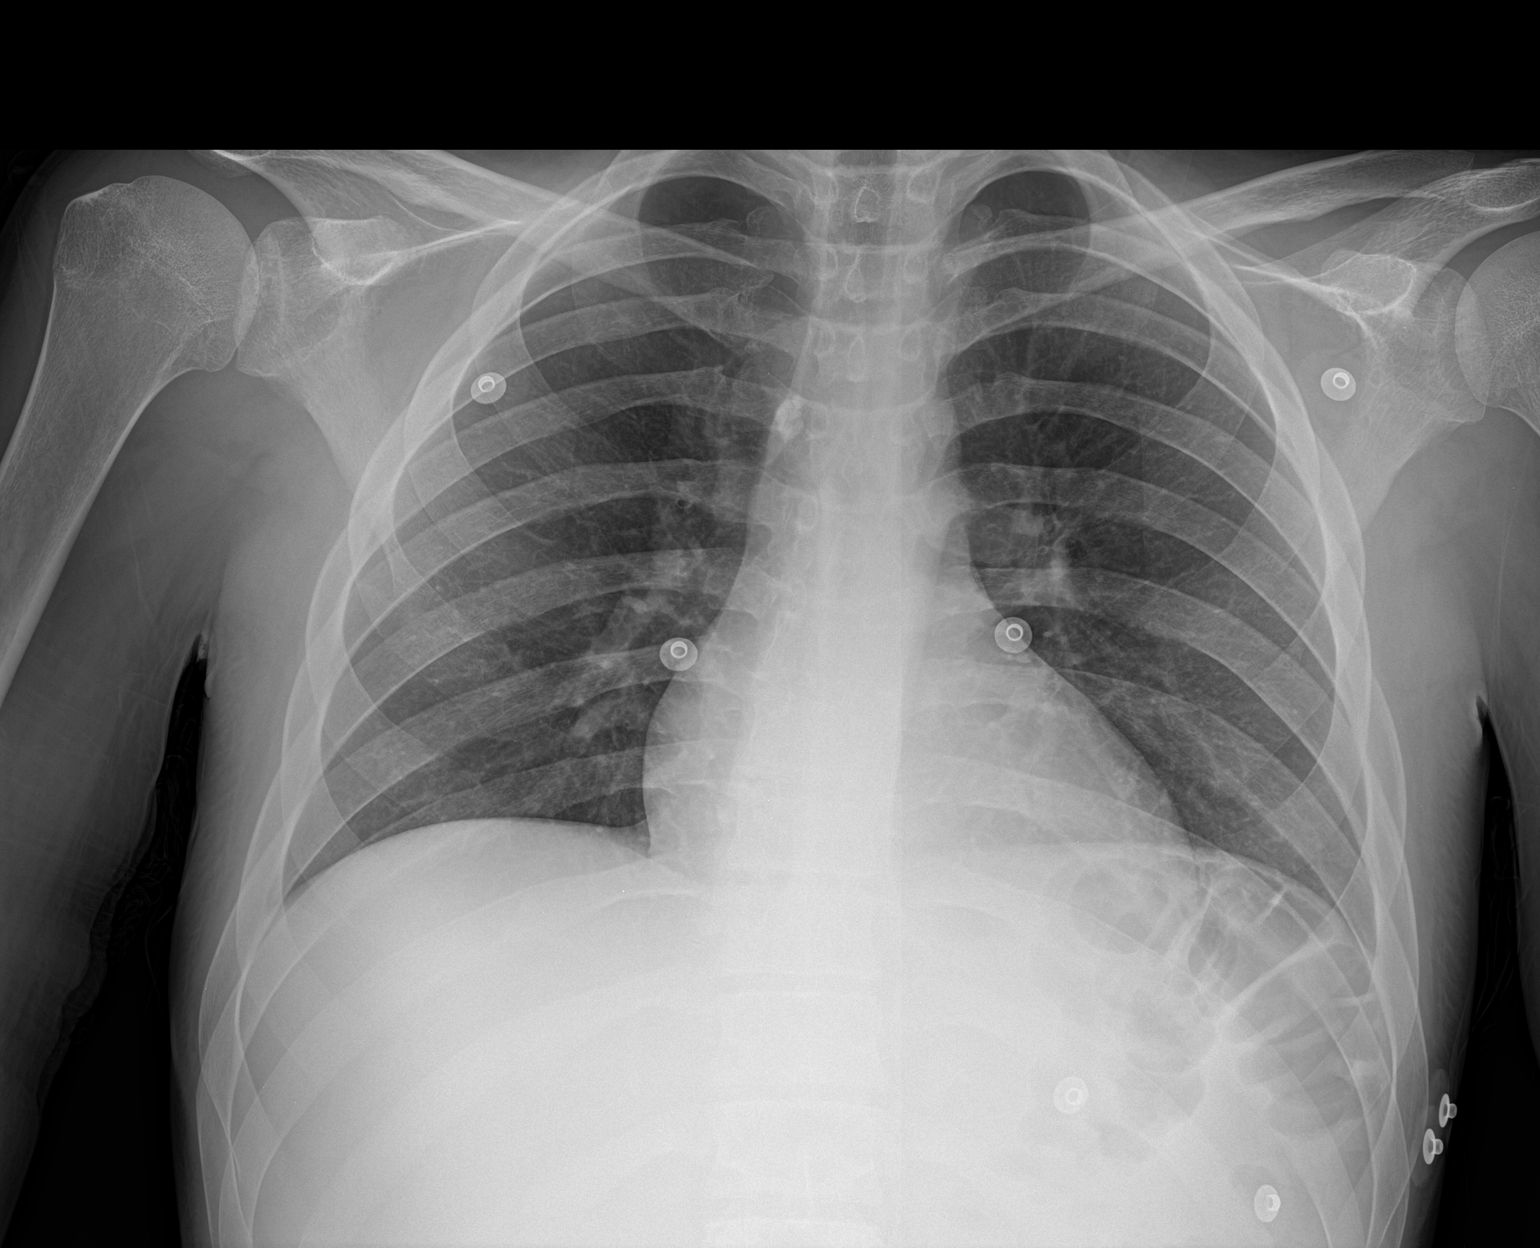

[1 of 1 positions shown; findings below may reference images not displayed]

FINDINGS: The heart size and mediastinal contours are within normal limits.
Both lungs are clear. The visualized skeletal structures are
unremarkable.
IMPRESSION: No active disease.
